# Patient Record
Sex: Female | Born: 1965 | Race: White | Hispanic: No | Smoking: Former smoker
Health system: Southern US, Community
[De-identification: ages and names within clinical notes are randomized; demographics above are authoritative.]

## PROBLEM LIST (undated history)

## (undated) DIAGNOSIS — R42 Dizziness and giddiness: Secondary | ICD-10-CM

## (undated) DIAGNOSIS — N926 Irregular menstruation, unspecified: Secondary | ICD-10-CM

## (undated) DIAGNOSIS — B009 Herpesviral infection, unspecified: Secondary | ICD-10-CM

## (undated) DIAGNOSIS — K219 Gastro-esophageal reflux disease without esophagitis: Secondary | ICD-10-CM

## (undated) DIAGNOSIS — N9489 Other specified conditions associated with female genital organs and menstrual cycle: Secondary | ICD-10-CM

## (undated) DIAGNOSIS — C439 Malignant melanoma of skin, unspecified: Secondary | ICD-10-CM

## (undated) DIAGNOSIS — F319 Bipolar disorder, unspecified: Secondary | ICD-10-CM

## (undated) DIAGNOSIS — F32A Depression, unspecified: Secondary | ICD-10-CM

## (undated) DIAGNOSIS — Z9889 Other specified postprocedural states: Secondary | ICD-10-CM

## (undated) DIAGNOSIS — F329 Major depressive disorder, single episode, unspecified: Secondary | ICD-10-CM

## (undated) HISTORY — PX: MELANOMA EXCISION: SHX5266

## (undated) HISTORY — PX: TONSILLECTOMY: SHX5217

## (undated) HISTORY — DX: Gastro-esophageal reflux disease without esophagitis: K21.9

## (undated) HISTORY — DX: Herpesviral infection, unspecified: B00.9

## (undated) HISTORY — DX: Irregular menstruation, unspecified: N92.6

## (undated) HISTORY — PX: COLONOSCOPY: SHX174

## (undated) HISTORY — DX: Malignant melanoma of skin, unspecified: C43.9

## (undated) HISTORY — PX: CERVICAL BIOPSY: SHX590

## (undated) HISTORY — PX: COLONOSCOPY: SHX5424

## (undated) HISTORY — PX: DILATION AND CURETTAGE OF UTERUS: SHX78

## (undated) HISTORY — DX: Major depressive disorder, single episode, unspecified: F32.9

## (undated) HISTORY — DX: Depression, unspecified: F32.A

## (undated) HISTORY — DX: Bipolar disorder, unspecified: F31.9

## (undated) HISTORY — DX: Other specified conditions associated with female genital organs and menstrual cycle: N94.89

---

## 1998-07-15 ENCOUNTER — Ambulatory Visit (HOSPITAL_COMMUNITY): Admission: RE | Admit: 1998-07-15 | Discharge: 1998-07-15 | Payer: Self-pay

## 2004-11-29 ENCOUNTER — Ambulatory Visit: Payer: Self-pay | Admitting: Unknown Physician Specialty

## 2006-01-30 ENCOUNTER — Ambulatory Visit: Payer: Self-pay | Admitting: Unknown Physician Specialty

## 2007-02-04 ENCOUNTER — Ambulatory Visit: Payer: Self-pay | Admitting: Unknown Physician Specialty

## 2008-07-16 ENCOUNTER — Ambulatory Visit: Payer: Self-pay | Admitting: Unknown Physician Specialty

## 2008-07-21 ENCOUNTER — Ambulatory Visit: Payer: Self-pay | Admitting: Gynecologic Oncology

## 2008-08-04 ENCOUNTER — Ambulatory Visit: Payer: Self-pay | Admitting: Gynecologic Oncology

## 2008-08-19 ENCOUNTER — Ambulatory Visit: Payer: Self-pay | Admitting: Gynecologic Oncology

## 2008-09-01 ENCOUNTER — Ambulatory Visit: Payer: Self-pay | Admitting: Gynecologic Oncology

## 2008-09-13 ENCOUNTER — Ambulatory Visit: Payer: Self-pay | Admitting: Gynecologic Oncology

## 2008-09-29 ENCOUNTER — Ambulatory Visit: Payer: Self-pay | Admitting: Gynecologic Oncology

## 2008-12-14 ENCOUNTER — Ambulatory Visit: Payer: Self-pay | Admitting: Gynecologic Oncology

## 2008-12-29 ENCOUNTER — Ambulatory Visit: Payer: Self-pay | Admitting: Gynecologic Oncology

## 2010-09-02 ENCOUNTER — Ambulatory Visit: Payer: Self-pay | Admitting: Unknown Physician Specialty

## 2012-04-26 ENCOUNTER — Ambulatory Visit: Payer: Self-pay | Admitting: Unknown Physician Specialty

## 2012-05-01 LAB — PATHOLOGY REPORT

## 2012-08-23 ENCOUNTER — Ambulatory Visit: Payer: Self-pay | Admitting: Unknown Physician Specialty

## 2013-04-08 ENCOUNTER — Ambulatory Visit: Payer: Self-pay | Admitting: Unknown Physician Specialty

## 2013-04-24 ENCOUNTER — Ambulatory Visit: Payer: Self-pay | Admitting: Unknown Physician Specialty

## 2013-11-03 ENCOUNTER — Ambulatory Visit: Payer: Self-pay | Admitting: Unknown Physician Specialty

## 2014-01-27 ENCOUNTER — Ambulatory Visit: Payer: Self-pay | Admitting: Obstetrics & Gynecology

## 2014-01-27 LAB — CBC
HCT: 41.9 % (ref 35.0–47.0)
HGB: 14 g/dL (ref 12.0–16.0)
MCH: 30.4 pg (ref 26.0–34.0)
MCHC: 33.3 g/dL (ref 32.0–36.0)
MCV: 91 fL (ref 80–100)
PLATELETS: 169 10*3/uL (ref 150–440)
RBC: 4.6 10*6/uL (ref 3.80–5.20)
RDW: 13.9 % (ref 11.5–14.5)
WBC: 7.5 10*3/uL (ref 3.6–11.0)

## 2014-01-27 LAB — PREGNANCY, URINE: PREGNANCY TEST, URINE: NEGATIVE m[IU]/mL

## 2014-02-05 ENCOUNTER — Ambulatory Visit: Payer: Self-pay | Admitting: Obstetrics & Gynecology

## 2014-02-09 LAB — PATHOLOGY REPORT

## 2014-10-22 ENCOUNTER — Emergency Department: Payer: Self-pay | Admitting: Emergency Medicine

## 2015-03-06 NOTE — Op Note (Signed)
PATIENT NAME:  Barbara [name], PROPPS MR#:  [id number] DATE OF BIRTH:  October 23, 1966  DATE OF PROCEDURE:  02/05/2014  PREOPERATIVE DIAGNOSES:   1.  Abnormal uterine bleeding.  2.  Endometrial thickening.  3.  Left lower quadrant pain.   POSTOPERATIVE DIAGNOSES: 1.  Abnormal uterine bleeding.  2.  Endometrial thickening.  3.  Left lower quadrant pain.   PROCEDURE: Laparoscopy with lysis of adhesions, hysteroscopy with D and C.  SURGEON:  R. Barnett Applebaum, MD  ANESTHESIA: General.   BLOOD LOSS: Minimal.   COMPLICATIONS: None.   FINDINGS: The patient had a thickened lining, but no polyps or fibroids seen with hysteroscopy. The patient had significant adhesions in the left lower quadrant around the left adnexa and colon. The patient had normal ovaries. The patient had 2 small 0.5 cm fibroids in the right fundal region of the uterus.   DISPOSITION: To the recovery room in stable condition.   TECHNIQUE: The patient is prepped and draped in the usual sterile fashion after adequate anesthesia is obtained in the dorsal lithotomy position. Bladder is drained with a Foley catheter.  A Hulka tenaculum is placed on the cervix for manipulation purposes, after the uterus is sounded to 8 cm and dilated to size 18 Pratt dilator.   Attention is then turned to abdomen, where a Veress needle is inserted through a 5 mm infraumbilical incision. Marcaine is used to anesthetize the skin. Veress needle placement is confirmed using the hanging drop technique, and the abdomen is then insufflated with CO2 gas. A 5 mm trocar is then inserted under direct visualization with the laparoscope, with no injuries or bleeding noted. The patient is placed in Trendelenburg position, and the above-mentioned findings visualized. A 5 mm trocar is placed in the right lower quadrant lateral to the inferior epigastric blood vessels, with no injuries or bleeding noted. Using the Harmonic scalpel, a lysis of adhesions is performed, with careful  dissection of the filmy as well as thickened adhesions of the colon to the left abdominal sidewall, as well as the left round ligament and fallopian tube to the pelvic sidewall. There are no ovarian masses. No cysts. No fluid collections. There is no evidence for infection or for congested veins or blood vessels. There is no sign of endometriosis. The patient does have 2 small 0.5 cm fibroids on the right fundal region of the uterus that are apparently incidental in finding. There is no reason for left adnexa removal or excision, as it is purely scar tissue in this area, and so no further surgical procedures are performed. The patient is leveled, gas is expelled, trocars are removed, and skin is closed with Dermabond.   Hysteroscopy D and C is performed. The cervix is dilated to a size 20 Pratt dilator after the Hulka tenaculum is removed. A 30-degree hysteroscope with lactated Ringer's solution distention of the intrauterine cavity is performed, with no polyps or fibroids seen. Hysteroscope is removed with a minimal discrepancy of fluid, and a D and C curettage procedure is performed, with specimen sent to Pathology for further review. The patient shows no bleeding from the cervix or from the tenaculum site, and goes to the recovery room in stable condition. All sponge, instrument and needle counts are correct.   ____________________________ R. Barnett Applebaum, MD rph:mr D: 02/05/2014 16:48:44 ET T: 02/05/2014 19:41:11 ET JOB#: [id number]  cc: Glean Salen, MD, <Dictator> Gae Dry MD ELECTRONICALLY SIGNED 02/06/2014 10:12

## 2015-09-27 ENCOUNTER — Other Ambulatory Visit: Payer: Self-pay | Admitting: Family Medicine

## 2015-09-27 ENCOUNTER — Ambulatory Visit (INDEPENDENT_AMBULATORY_CARE_PROVIDER_SITE_OTHER): Payer: 59 | Admitting: Family Medicine

## 2015-09-27 ENCOUNTER — Encounter: Payer: Self-pay | Admitting: Family Medicine

## 2015-09-27 VITALS — BP 102/64 | HR 78 | Temp 98.5°F | Resp 12 | Ht 68.5 in | Wt 206.0 lb

## 2015-09-27 DIAGNOSIS — Z13220 Encounter for screening for lipoid disorders: Secondary | ICD-10-CM | POA: Diagnosis not present

## 2015-09-27 DIAGNOSIS — G4700 Insomnia, unspecified: Secondary | ICD-10-CM

## 2015-09-27 DIAGNOSIS — F3175 Bipolar disorder, in partial remission, most recent episode depressed: Secondary | ICD-10-CM | POA: Insufficient documentation

## 2015-09-27 DIAGNOSIS — F3176 Bipolar disorder, in full remission, most recent episode depressed: Secondary | ICD-10-CM

## 2015-09-27 DIAGNOSIS — F5105 Insomnia due to other mental disorder: Secondary | ICD-10-CM | POA: Insufficient documentation

## 2015-09-27 DIAGNOSIS — R5383 Other fatigue: Secondary | ICD-10-CM | POA: Insufficient documentation

## 2015-09-27 DIAGNOSIS — Z136 Encounter for screening for cardiovascular disorders: Secondary | ICD-10-CM

## 2015-09-27 DIAGNOSIS — IMO0001 Reserved for inherently not codable concepts without codable children: Secondary | ICD-10-CM

## 2015-09-27 DIAGNOSIS — F99 Mental disorder, not otherwise specified: Secondary | ICD-10-CM | POA: Insufficient documentation

## 2015-09-27 NOTE — Progress Notes (Signed)
Name: Barbara [name]   MRN: [id number]    DOB: May 12, 1966   Date:09/27/2015       Progress Note  Subjective  Chief Complaint  Chief Complaint  Patient presents with  . Establish Care  . Medication Management    patient used to see a psychiatrist in [place], but they stopped taking her insurance.    HPI  Barbara [name] is a 49 y.o. female here today to transition care of medical needs to a primary care provider. Reports long history of depression and Bipolar disorder which is currently stable on current medications. She was going to a psychiatrist but they no longer take her insurance. Denies any changes to mood or affect. Has sufficient refills until June/July 2017. Works for [employer] and has had biometric screening, is willing to redo some blood work. Reports occasional fatigue, just had mammogram and female CPE with gynecologist, is peri-menopausal.     Past Medical History  Diagnosis Date  . Depression   . Melanoma [facility])     Patient Active Problem List   Diagnosis Date Noted  . Insomnia 09/27/2015    Social History  Substance Use Topics  . Smoking status: Former Research scientist (life sciences)  . Smokeless tobacco: Not on file  . Alcohol Use: No     Current outpatient prescriptions:  .  divalproex (DEPAKOTE ER) 500 MG 24 hr tablet, Take 1 tablet by mouth daily., Disp: , Rfl:  .  ELIDEL 1 % cream, Apply 1 application topically 2 (two) times daily as needed., Disp: , Rfl:  .  LATUDA 40 MG TABS tablet, Take 1 tablet by mouth daily., Disp: , Rfl:  .  traZODone (DESYREL) 50 MG tablet, Take 1-3 tablets by mouth at bedtime as needed., Disp: , Rfl: 11  Past Surgical History  Procedure Laterality Date  . Tonsillectomy    . Colonoscopy    . Cervical biopsy      Dr. Kenton Kingfisher with [facility]    Family History  Problem Relation Age of Onset  . Heart attack Father   . Cancer Maternal Aunt     breast  . Cancer Paternal Aunt     breast    Allergies  Allergen Reactions  . Penicillins  Hives and Other (See Comments)    delusional  . Sulfa Antibiotics Hives and Other (See Comments)    dellusional     Review of Systems  CONSTITUTIONAL: No significant weight changes, fever, chills, weakness or fatigue.  HEENT:  - Eyes: No visual changes.  - Ears: No auditory changes. No pain.  - Nose: No sneezing, congestion, runny nose. - Throat: No sore throat. No changes in swallowing. SKIN: No rash or itching.  CARDIOVASCULAR: No chest pain, chest pressure or chest discomfort. No palpitations or edema.  RESPIRATORY: No shortness of breath, cough or sputum.  GASTROINTESTINAL: No anorexia, nausea, vomiting. No changes in bowel habits. No abdominal pain or blood.  NEUROLOGICAL: No headache, dizziness, syncope, paralysis, ataxia, numbness or tingling in the extremities. No memory changes. No change in bowel or bladder control.  MUSCULOSKELETAL: No joint pain. No muscle pain. HEMATOLOGIC: No anemia, bleeding or bruising.  LYMPHATICS: No enlarged lymph nodes.  PSYCHIATRIC: No change in mood. No change in sleep pattern.  ENDOCRINOLOGIC: No reports of sweating, cold or heat intolerance. No polyuria or polydipsia.     Objective  BP 102/64 mmHg  Pulse 78  Temp(Src) 98.5 F (36.9 C) (Oral)  Resp 12  Ht 5' 8.5" (1.74 m)  Wt 206  lb (93.441 kg)  BMI 30.86 kg/m2  SpO2 97% Body mass index is 30.86 kg/(m^2).  Physical Exam  Constitutional: Patient appears well-developed and well-nourished. In no distress.  HEENT:  - Head: Normocephalic and atraumatic.  - Ears: Bilateral TMs gray, no erythema or effusion - Nose: Nasal mucosa moist - Mouth/Throat: Oropharynx is clear and moist. No tonsillar hypertrophy or erythema. No post nasal drainage.  - Eyes: Conjunctivae clear, EOM movements normal. PERRLA. No scleral icterus.  Neck: Normal range of motion. Neck supple. No JVD present. No thyromegaly present.  Cardiovascular: Normal rate, regular rhythm and normal heart sounds.  No murmur  heard.  Pulmonary/Chest: Effort normal and breath sounds normal. No respiratory distress. Musculoskeletal: Normal range of motion bilateral UE and LE, no joint effusions. Peripheral vascular: Bilateral LE no edema. Neurological: CN II-XII grossly intact with no focal deficits. Alert and oriented to person, place, and time. Coordination, balance, strength, speech and gait are normal.  Skin: Skin is warm and dry. No rash noted. No erythema.  Psychiatric: Patient has a stable mood and affect. Behavior is normal in office today. Judgment and thought content normal in office today.  Assessment & Plan  1. Bipolar 1 disorder, depressed, full remission ([place]) I will continue to refill her medications unless there are suboptimal mood changes at which time I will refer her to a new psychiatrist.  - CBC with Differential/Platelet - Comprehensive metabolic panel - TSH  2. Encounter for cholesteral screening for cardiovascular disease  - Lipid panel  3. Other fatigue  - CBC with Differential/Platelet - Comprehensive metabolic panel - TSH

## 2015-10-04 ENCOUNTER — Encounter: Payer: Self-pay | Admitting: Family Medicine

## 2015-10-14 LAB — CBC WITH DIFFERENTIAL/PLATELET
Basophils Absolute: 0 10*3/uL (ref 0.0–0.2)
Basos: 1 %
EOS (ABSOLUTE): 0.2 10*3/uL (ref 0.0–0.4)
EOS: 4 %
HEMATOCRIT: 40.6 % (ref 34.0–46.6)
HEMOGLOBIN: 13.5 g/dL (ref 11.1–15.9)
Immature Grans (Abs): 0 10*3/uL (ref 0.0–0.1)
Immature Granulocytes: 0 %
LYMPHS ABS: 2.6 10*3/uL (ref 0.7–3.1)
Lymphs: 44 %
MCH: 30 pg (ref 26.6–33.0)
MCHC: 33.3 g/dL (ref 31.5–35.7)
MCV: 90 fL (ref 79–97)
MONOCYTES: 10 %
Monocytes Absolute: 0.6 10*3/uL (ref 0.1–0.9)
NEUTROS ABS: 2.4 10*3/uL (ref 1.4–7.0)
Neutrophils: 41 %
Platelets: 189 10*3/uL (ref 150–379)
RBC: 4.5 x10E6/uL (ref 3.77–5.28)
RDW: 14.5 % (ref 12.3–15.4)
WBC: 5.9 10*3/uL (ref 3.4–10.8)

## 2015-10-15 LAB — COMPREHENSIVE METABOLIC PANEL
A/G RATIO: 1.6 (ref 1.1–2.5)
ALT: 10 IU/L (ref 0–32)
AST: 16 IU/L (ref 0–40)
Albumin: 4.2 g/dL (ref 3.5–5.5)
Alkaline Phosphatase: 40 IU/L (ref 39–117)
BUN/Creatinine Ratio: 32 — ABNORMAL HIGH (ref 9–23)
BUN: 26 mg/dL — ABNORMAL HIGH (ref 6–24)
Bilirubin Total: 0.2 mg/dL (ref 0.0–1.2)
CALCIUM: 9.4 mg/dL (ref 8.7–10.2)
CO2: 26 mmol/L (ref 18–29)
Chloride: 101 mmol/L (ref 97–106)
Creatinine, Ser: 0.81 mg/dL (ref 0.57–1.00)
GFR, EST AFRICAN AMERICAN: 97 mL/min/{1.73_m2} (ref >59)
GFR, EST NON AFRICAN AMERICAN: 84 mL/min/{1.73_m2} (ref >59)
GLOBULIN, TOTAL: 2.7 g/dL (ref 1.5–4.5)
Glucose: 77 mg/dL (ref 65–99)
Potassium: 4.5 mmol/L (ref 3.5–5.2)
SODIUM: 139 mmol/L (ref 136–144)
TOTAL PROTEIN: 6.9 g/dL (ref 6.0–8.5)

## 2015-10-15 LAB — TSH: TSH: 2.77 u[IU]/mL (ref 0.450–4.500)

## 2015-10-15 LAB — LIPID PANEL
CHOL/HDL RATIO: 4.3 ratio (ref 0.0–4.4)
Cholesterol, Total: 210 mg/dL — ABNORMAL HIGH (ref 100–199)
HDL: 49 mg/dL (ref >39)
LDL Calculated: 132 mg/dL — ABNORMAL HIGH (ref 0–99)
Triglycerides: 144 mg/dL (ref 0–149)
VLDL Cholesterol Cal: 29 mg/dL (ref 5–40)

## 2016-02-04 ENCOUNTER — Emergency Department
Admission: EM | Admit: 2016-02-04 | Discharge: 2016-02-04 | Disposition: A | Discharge status: Home / Self Care | Payer: 59 | Attending: Emergency Medicine | Admitting: Emergency Medicine

## 2016-02-04 ENCOUNTER — Emergency Department: Payer: 59

## 2016-02-04 DIAGNOSIS — C439 Malignant melanoma of skin, unspecified: Secondary | ICD-10-CM | POA: Insufficient documentation

## 2016-02-04 DIAGNOSIS — Z87891 Personal history of nicotine dependence: Secondary | ICD-10-CM | POA: Insufficient documentation

## 2016-02-04 DIAGNOSIS — R0789 Other chest pain: Secondary | ICD-10-CM | POA: Diagnosis present

## 2016-02-04 DIAGNOSIS — Z79899 Other long term (current) drug therapy: Secondary | ICD-10-CM | POA: Insufficient documentation

## 2016-02-04 DIAGNOSIS — F3176 Bipolar disorder, in full remission, most recent episode depressed: Secondary | ICD-10-CM | POA: Insufficient documentation

## 2016-02-04 DIAGNOSIS — Z7982 Long term (current) use of aspirin: Secondary | ICD-10-CM | POA: Insufficient documentation

## 2016-02-04 DIAGNOSIS — R079 Chest pain, unspecified: Secondary | ICD-10-CM

## 2016-02-04 LAB — BASIC METABOLIC PANEL
Anion gap: 7 (ref 5–15)
BUN: 21 mg/dL — AB (ref 6–20)
CHLORIDE: 108 mmol/L (ref 101–111)
CO2: 23 mmol/L (ref 22–32)
Calcium: 8.8 mg/dL — ABNORMAL LOW (ref 8.9–10.3)
Creatinine, Ser: 1 mg/dL (ref 0.44–1.00)
GFR calc Af Amer: >60 mL/min (ref >60)
GFR calc non Af Amer: >60 mL/min (ref >60)
Glucose, Bld: 92 mg/dL (ref 65–99)
POTASSIUM: 4 mmol/L (ref 3.5–5.1)
SODIUM: 138 mmol/L (ref 135–145)

## 2016-02-04 LAB — CBC
HEMATOCRIT: 40.8 % (ref 35.0–47.0)
Hemoglobin: 13.8 g/dL (ref 12.0–16.0)
MCH: 30.5 pg (ref 26.0–34.0)
MCHC: 33.9 g/dL (ref 32.0–36.0)
MCV: 89.8 fL (ref 80.0–100.0)
PLATELETS: 168 10*3/uL (ref 150–440)
RBC: 4.54 MIL/uL (ref 3.80–5.20)
RDW: 13.9 % (ref 11.5–14.5)
WBC: 7.3 10*3/uL (ref 3.6–11.0)

## 2016-02-04 LAB — TROPONIN I

## 2016-02-04 MED ORDER — LORAZEPAM 2 MG/ML IJ SOLN
1.0000 mg | Freq: Once | INTRAMUSCULAR | Status: AC
  Administered 2016-02-04: 1 mg via INTRAVENOUS
  Filled 2016-02-04: qty 1

## 2016-02-04 MED ORDER — ONDANSETRON HCL 4 MG/2ML IJ SOLN
4.0000 mg | Freq: Once | INTRAMUSCULAR | Status: AC
  Administered 2016-02-04: 4 mg via INTRAVENOUS
  Filled 2016-02-04: qty 2

## 2016-02-04 NOTE — ED Notes (Signed)
Lab contacted this RN to state that blood specimens are hemolyzed.

## 2016-02-04 NOTE — ED Notes (Signed)
Patient transported to X-ray 

## 2016-02-04 NOTE — ED Notes (Signed)
Pt up to the bathroom

## 2016-02-04 NOTE — Discharge Instructions (Signed)
You have been seen in the emergency department today for chest pain. Your workup has shown normal results. As we discussed please follow-up with your primary care physician in the next 1-2 days for recheck. Return to the emergency department for any further chest pain, trouble breathing, or any other symptom personally concerning to yourself. °Please call the number provided for cardiology to arrange a stress test as soon as possible. ° ° °Nonspecific Chest Pain °It is often hard to find the cause of chest pain. There is always a chance that your pain could be related to something serious, such as a heart attack or a blood clot in your lungs. Chest pain can also be caused by conditions that are not life-threatening. If you have chest pain, it is very important to follow up with your doctor. ° °HOME CARE °· If you were prescribed an antibiotic medicine, finish it all even if you start to feel better. °· Avoid any activities that cause chest pain. °· Do not use any tobacco products, including cigarettes, chewing tobacco, or electronic cigarettes. If you need help quitting, ask your doctor. °· Do not drink alcohol. °· Take medicines only as told by your doctor. °· Keep all follow-up visits as told by your doctor. This is important. This includes any further testing if your chest pain does not go away. °· Your doctor may tell you to keep your head raised (elevated) while you sleep. °· Make lifestyle changes as told by your doctor. These may include: °¨ Getting regular exercise. Ask your doctor to suggest some activities that are safe for you. °¨ Eating a heart-healthy diet. Your doctor or a diet specialist (dietitian) can help you to learn healthy eating options. °¨ Maintaining a healthy weight. °¨ Managing diabetes, if necessary. °¨ Reducing stress. °GET HELP IF: °· Your chest pain does not go away, even after treatment. °· You have a rash with blisters on your chest. °· You have a fever. °GET HELP RIGHT AWAY  IF: °· Your chest pain is worse. °· You have an increasing cough, or you cough up blood. °· You have severe belly (abdominal) pain. °· You feel extremely weak. °· You pass out (faint). °· You have chills. °· You have sudden, unexplained chest discomfort. °· You have sudden, unexplained discomfort in your arms, back, neck, or jaw. °· You have shortness of breath at any time. °· You suddenly start to sweat, or your skin gets clammy. °· You feel nauseous. °· You vomit. °· You suddenly feel light-headed or dizzy. °· Your heart begins to beat quickly, or it feels like it is skipping beats. °These symptoms may be an emergency. Do not wait to see if the symptoms will go away. Get medical help right away. Call your local emergency services (911 in the [place] Do not drive yourself to the hospital. °  °This information is not intended to replace advice given to you by your health care provider. Make sure you discuss any questions you have with your health care provider. °  °Document Released: 04/17/2008 Document Revised: 11/20/2014 Document Reviewed: 06/05/2014 °Elsevier Interactive Patient Education ©2016 Elsevier Inc. ° °

## 2016-02-04 NOTE — ED Notes (Signed)
Pt awoke with left sided chest discomfort this AM. Pt is very anxious, tearful and states that she is scared. HX of panic attacks and GERD. Pt reports excess flatus this AM. Pt alert and oriented X4, active, cooperative, pt in NAD. RR even and unlabored, color WNL.

## 2016-02-04 NOTE — ED Provider Notes (Signed)
[facility] Emergency Department Provider Note  Time seen: 7:35 AM  I have reviewed the triage vital signs and the nursing notes.   HISTORY  Chief Complaint Chest Pain    HPI Barbara [name] is a 50 y.o. female with a past medical history of depression presents the emergency department with chest pain. According to the patient since 3:00 this morning she has been experiencing left-sided chest discomfort. Patient states she has had chest pain previously with her anxiety/gastric reflux, but states today felt different. States she could not sleep due to the chest discomfort. Denies any shortness of breath or diaphoresis, does admit nausea. Denies vomiting. Patient describes the pain as an aching pain in her left chest. Denies any pleuritic component. Patient states she is just getting over an upper respiratory infection for the past 1.5 weeks. Occasional cough but denies sputum production. Describes her chest discomfort is mild to moderate.     Past Medical History  Diagnosis Date  . Depression   . Melanoma [facility]     Patient Active Problem List   Diagnosis Date Noted  . Insomnia 09/27/2015  . Bipolar 1 disorder, depressed, full remission ([place]) 09/27/2015  . Encounter for cholesteral screening for cardiovascular disease 09/27/2015  . Other fatigue 09/27/2015    Past Surgical History  Procedure Laterality Date  . Tonsillectomy    . Colonoscopy    . Cervical biopsy      Dr. Kenton Kingfisher with [facility]    Current Outpatient Rx  Name  Route  Sig  Dispense  Refill  . divalproex (DEPAKOTE ER) 500 MG 24 hr tablet   Oral   Take 1 tablet by mouth daily.         Marland Kitchen ELIDEL 1 % cream   Topical   Apply 1 application topically 2 (two) times daily as needed.           Dispense as written.   Marland Kitchen LATUDA 40 MG TABS tablet   Oral   Take 1 tablet by mouth daily.           Dispense as written.   . traZODone (DESYREL) 50 MG tablet   Oral   Take 1-3  tablets by mouth at bedtime as needed.      11     Allergies Penicillins and Sulfa antibiotics  Family History  Problem Relation Age of Onset  . Heart attack Father   . Cancer Maternal Aunt     breast  . Cancer Paternal Aunt     breast    Social History Social History  Substance Use Topics  . Smoking status: Former Research scientist (life sciences)  . Smokeless tobacco: Not on file  . Alcohol Use: No    Review of Systems Constitutional: Negative for fever. Cardiovascular: Left chest pain Respiratory: Negative for shortness of breath. Occasional cough Gastrointestinal: Negative for abdominal pain Musculoskeletal: Negative for back pain. Neurological: Negative for headache 10-point ROS otherwise negative.  ____________________________________________   PHYSICAL EXAM:  VITAL SIGNS: ED Triage Vitals  Enc Vitals Group     BP 02/04/16 0618 148/78 mmHg     Pulse Rate 02/04/16 0618 71     Resp 02/04/16 0618 18     Temp 02/04/16 0618 98 F (36.7 C)     Temp src --      SpO2 02/04/16 0618 100 %     Weight 02/04/16 0618 200 lb (90.719 kg)     Height 02/04/16 0618 5\' 9"  (1.753 m)  Head Cir --      Peak Flow --      Pain Score 02/04/16 0618 5     Pain Loc --      Pain Edu? --      Excl. in [place]? --     Constitutional: Alert and oriented. Overall well-appearing, but tremulous, consistent with moderate anxiety. Eyes: Normal exam ENT   Head: Normocephalic and atraumatic   Mouth/Throat: Mucous membranes are moist. Cardiovascular: Normal rate, regular rhythm. No murmur Respiratory: Normal respiratory effort without tachypnea nor retractions. Breath sounds are clear. Mild left chest tenderness to palpation. Gastrointestinal: Soft and nontender. No distention.   Musculoskeletal: Nontender with normal range of motion in all extremities. No lower extremity tenderness or edema. Neurologic:  Normal speech and language. No gross focal neurologic deficits Skin:  Skin is warm, dry and intact.   Psychiatric: Mood and affect are normal. Speech and behavior are normal. ____________________________________________    EKG  EKG reviewed and interpreted by myself shows normal sinus rhythm at 73 bpm, narrow QRS, normal axis, normal intervals, nonspecific ST changes. No ST elevations. ____________________________________________    RADIOLOGY  Chest x-ray shows no acute abnormality     INITIAL IMPRESSION / ASSESSMENT AND PLAN / ED COURSE  Pertinent labs & imaging results that were available during my care of the patient were reviewed by me and considered in my medical decision making (see chart for details).  Patient presents the emergency department with left-sided chest discomfort. Currently the patient appears overall well but is very anxious, tremulous voice, admits feeling very anxious. We will check labs, chest x-ray, EKG does not show any acute findings. We will chew with Ativan while awaiting lab and chest x-ray results. Patient agreeable.  Chest x-ray negative. Labs within normal limits. Second troponin negative. We will discharge home with cardiology follow-up for a stress test. Patient agreeable to plan. Discussed when normal chest pain return precautions.  ____________________________________________   FINAL CLINICAL IMPRESSION(S) / ED DIAGNOSES  Chest pain   Harvest Dark, MD 02/04/16 1134

## 2016-02-04 NOTE — ED Notes (Signed)
Pt in with co midsternal chest pain that started at 0300 radiates to left chest no hx of heart disease.

## 2016-02-09 ENCOUNTER — Encounter: Payer: Self-pay | Admitting: Cardiovascular Disease

## 2016-02-09 ENCOUNTER — Ambulatory Visit (INDEPENDENT_AMBULATORY_CARE_PROVIDER_SITE_OTHER): Payer: 59 | Admitting: Cardiovascular Disease

## 2016-02-09 VITALS — BP 110/78 | HR 78 | Ht 69.0 in | Wt 201.5 lb

## 2016-02-09 DIAGNOSIS — E669 Obesity, unspecified: Secondary | ICD-10-CM | POA: Insufficient documentation

## 2016-02-09 DIAGNOSIS — E785 Hyperlipidemia, unspecified: Secondary | ICD-10-CM

## 2016-02-09 DIAGNOSIS — R0789 Other chest pain: Secondary | ICD-10-CM | POA: Diagnosis not present

## 2016-02-09 DIAGNOSIS — R079 Chest pain, unspecified: Secondary | ICD-10-CM | POA: Diagnosis not present

## 2016-02-09 NOTE — Patient Instructions (Addendum)
You are doing well. No medication changes were made.  We will place an order for a treadmill stress echo for chest pain, abnormal EKG   Please call us if you have new issues that need to be addressed before your next appt.      Stress Echo: April 24th at 10:30 AM   How to prepare for your Stress Echo test:  1.   No caffeine for 24 hours prior to test 2.   No smoking 24 hours prior to test. 3.   Ladies, please do not wear dresses.  Skirts or pants are appropriate. Please wear a short sleeve shirt. 4.  No perfume, cologne or lotion. 5.  Wear comfortable walking shoes. No heels!          Exercise Stress Echocardiogram An exercise stress echocardiogram is a heart (cardiac) test used to check the function of your heart. This test may also be called an exercise stress echocardiography or stress echo. This stress test will check how well your heart muscle and valves are working and determine if your heart muscle is getting enough blood. You will exercise on a treadmill to naturally increase or stress the functioning of your heart.  An echocardiogram uses sound waves (ultrasound) to produce an image of your heart. If your heart does not work normally, it may indicate coronary artery disease with poor coronary blood supply. The coronary arteries are the arteries that bring blood and oxygen to your heart. LET [facility] CARE PROVIDER KNOW ABOUT:  Any allergies you have.  All medicines you are taking, including vitamins, herbs, eye drops, creams, and over-the-counter medicines.  Previous problems you or members of your family have had with the use of anesthetics.  Any blood disorders you have.  Previous surgeries you have had.  Medical conditions you have.  Possibility of pregnancy, if this applies. RISKS AND COMPLICATIONS Generally, this is a safe procedure. However, as with any procedure, complications can occur. Possible complications can include:  You develop pain or pressure  in the following areas:  Chest.  Jaw or neck.  Between your shoulder blades.  Radiating down your left arm.  Dizziness or lightheadedness.  Shortness of breath.  Increased or irregular heartbeat.  Nausea or vomiting.  Heart attack (rare). BEFORE THE PROCEDURE  Avoid all forms of caffeine for 24 hours before your test or as directed by your health care provider. This includes coffee, tea (even decaffeinated tea), caffeinated sodas, chocolate, cocoa, and certain pain medicines.  Follow your health care provider's instructions regarding eating and drinking before the test.  Take your medicines as directed at regular times with water unless instructed otherwise. Exceptions may include:  If you have diabetes, ask how you are to take your insulin or pills. It is common to adjust insulin dosing the morning of the test.  If you are taking beta-blocker medicines, it is important to talk to your health care provider about these medicines well before the date of your test. Taking beta-blocker medicines may interfere with the test. In some cases, these medicines need to be changed or stopped 24 hours or more before the test.  If you wear a nitroglycerin patch, it may need to be removed prior to the test. Ask your health care provider if the patch should be removed before the test.  If you use an inhaler for any breathing condition, bring it with you to the test.  If you are an outpatient, bring a snack so you can eat right  after the stress phase of the test.  Do not smoke for 4 hours prior to the test or as directed by your health care provider.  Wear loose-fitting clothes and comfortable shoes for the test. This test involves walking on a treadmill. PROCEDURE   Multiple electrodes will be put on your chest. If needed, small areas of your chest may be shaved to get better contact with the electrodes. Once the electrodes are attached to your body, multiple wires will be attached to the  electrodes, and your heart rate will be monitored.  You will have an echocardiogram done at rest.  To produce this image of your heart, gel is applied to your chest, and a wand-like tool (transducer) is moved over the chest. The transducer sends the sound waves through the chest to create the moving images of your heart.  You may need an IV to receive a medication that improves the quality of the pictures.  You will then walk on a treadmill. The treadmill will be started at a slow pace. The treadmill speed and incline will gradually be increased to raise your heart rate.  At the peak of exercise, the treadmill will be stopped. You will lie down immediately on a bed so that a second echocardiogram can be done to visualize your heart's motion with exercise.  The test usually takes 30-60 minutes to complete. AFTER THE PROCEDURE  Your heart rate and blood pressure will be monitored after the test.  You may return to your normal schedule, including diet, activities, and medicines, unless your health care provider tells you otherwise.   This information is not intended to replace advice given to you by your health care provider. Make sure you discuss any questions you have with your health care provider.   Document Released: 11/03/2004 Document Revised: 11/04/2013 Document Reviewed: 07/07/2013 Elsevier Interactive Patient Education [employer].

## 2016-02-09 NOTE — Assessment & Plan Note (Signed)
Recommended weight loss, exercise in an effort to improve her cholesterol numbers

## 2016-02-09 NOTE — Progress Notes (Signed)
Patient ID: LORRINE WILLOCKS, female    DOB: 30-Oct-1966, 50 y.o.   MRN: [id number]  HPI Comments: Ms. Kobes is a pleasant 50 year old woman with history of depression, anxiety, obesity, hyperlipidemia who presents for evaluation of chest pain symptoms. Patient was referred for consultation by ER physician, Dr. Kerman Passey after recent emergency room visit Prior history of chest pain in her mediastinum  She reports that last week she woke at 3 AM with severe left-sided chest pain. Symptoms persisted into the morning, she waited until later in the morning to contact family and they took her to the emergency room. She described the pain as the knee the left breast, some discomfort on palpation, heavy pressure This left side chest pain was different from prior central mediastinal chest pressures she had in the past  Emergency room records reviewed with her, Cardiac enzymes negative, EKG showing nonspecific ST and T wave abnormality. Other lab work normal, symptoms eventually resolved and she was discharged home with follow-up today  She does report no smoking, no diabetes Lab work reviewed showing total cholesterol 210, LDL 132 Weight has been trending upwards, she is trying to change her diet to lose weight  She does report strong family history of coronary artery disease, father in his 51s but he was a heavy smoker, he had an MI  EKG on today's visit shows normal sinus rhythm with rate 78 bpm, nonspecific ST abnormality in V4 through V6 EKG from the emergency room reviewed from March 24 also showing nonspecific ST abnormality anterolateral leads      Allergies  Allergen Reactions  . Penicillins Hives and Other (See Comments)    delusional  . Sulfa Antibiotics Hives and Other (See Comments)    dellusional    Current Outpatient Prescriptions on File Prior to Visit  Medication Sig Dispense Refill  . Aspirin-Acetaminophen-Caffeine (EXCEDRIN MIGRAINE PO) Take 1-2 tablets by mouth daily  as needed (for migraine).    . divalproex (DEPAKOTE ER) 500 MG 24 hr tablet Take 2 tablets by mouth daily.     Marland Kitchen ELIDEL 1 % cream Apply 1 application topically 2 (two) times daily as needed.    Marland Kitchen LATUDA 40 MG TABS tablet Take 1 tablet by mouth daily.    . traZODone (DESYREL) 50 MG tablet Take 1-3 tablets by mouth at bedtime as needed for sleep.   11   No current facility-administered medications on file prior to visit.    Past Medical History  Diagnosis Date  . Depression   . Melanoma [facility])     Past Surgical History  Procedure Laterality Date  . Tonsillectomy    . Colonoscopy    . Cervical biopsy      Dr. Kenton Kingfisher with [facility]  reports that she has quit smoking. She does not have any smokeless tobacco history on file. She reports that she does not drink alcohol or use illicit drugs.  Family History family history includes Cancer in her maternal aunt and paternal aunt; Heart attack in her father.      Review of Systems  Constitutional: Negative.   HENT: Negative.   Eyes: Negative.   Respiratory: Positive for chest tightness.   Cardiovascular: Positive for chest pain.  Gastrointestinal: Negative.   Endocrine: Negative.   Musculoskeletal: Negative.   Skin: Negative.   Allergic/Immunologic: Negative.   Neurological: Negative.   Hematological: Negative.   Psychiatric/Behavioral: Negative.   All other systems reviewed and are negative.   BP 110/78  mmHg  Pulse 78  Ht 5\' 9"  (1.753 m)  Wt 201 lb 8 oz (91.4 kg)  BMI 29.74 kg/m2  Physical Exam  Constitutional: She is oriented to person, place, and time. She appears well-developed and well-nourished.  Obese  HENT:  Head: Normocephalic.  Nose: Nose normal.  Mouth/Throat: Oropharynx is clear and moist.  Eyes: Conjunctivae are normal. Pupils are equal, round, and reactive to light.  Neck: Normal range of motion. Neck supple. No JVD present.  Cardiovascular: Normal rate, regular rhythm, normal  heart sounds and intact distal pulses.  Exam reveals no gallop and no friction rub.   No murmur heard. Pulmonary/Chest: Effort normal and breath sounds normal. No respiratory distress. She has no wheezes. She has no rales. She exhibits no tenderness.  Abdominal: Soft. Bowel sounds are normal. She exhibits no distension. There is no tenderness.  Musculoskeletal: Normal range of motion. She exhibits no edema or tenderness.  Lymphadenopathy:    She has no cervical adenopathy.  Neurological: She is alert and oriented to person, place, and time. Coordination normal.  Skin: Skin is warm and dry. No rash noted. No erythema.  Psychiatric: She has a normal mood and affect. Her behavior is normal. Judgment and thought content normal.

## 2016-02-09 NOTE — Assessment & Plan Note (Addendum)
Etiology of her left-sided chest pain is unclear Hospital records reviewed.  Nonsmoker, no diabetes, but she does have high cholesterol, family history. Long discussion concerning theVarious treatment options discussed with her. She would like a cardiac workup given her family history. We will schedule her for stress echocardiogram to rule out ischemia.  If study shows no wall motion abnormality, no ischemia, she may benefit from GI workup if symptoms recur

## 2016-02-09 NOTE — Assessment & Plan Note (Signed)
Long discussion concerning her diet, need for daily exercise. Diet provided to her today, long discussion concerning various dietary changes that she can make.   Total encounter time more than 60 minutes  Greater than 50% was spent in counseling and coordination of care with the patient

## 2016-03-06 ENCOUNTER — Encounter: Payer: Self-pay | Admitting: Family Medicine

## 2016-03-06 ENCOUNTER — Ambulatory Visit (INDEPENDENT_AMBULATORY_CARE_PROVIDER_SITE_OTHER): Payer: 59

## 2016-03-06 ENCOUNTER — Ambulatory Visit (INDEPENDENT_AMBULATORY_CARE_PROVIDER_SITE_OTHER): Payer: 59 | Admitting: Family Medicine

## 2016-03-06 VITALS — BP 122/84 | HR 82 | Temp 98.8°F | Resp 14 | Wt 201.5 lb

## 2016-03-06 DIAGNOSIS — G4700 Insomnia, unspecified: Secondary | ICD-10-CM | POA: Diagnosis not present

## 2016-03-06 DIAGNOSIS — F3176 Bipolar disorder, in full remission, most recent episode depressed: Secondary | ICD-10-CM

## 2016-03-06 DIAGNOSIS — R079 Chest pain, unspecified: Secondary | ICD-10-CM | POA: Diagnosis not present

## 2016-03-06 DIAGNOSIS — Z23 Encounter for immunization: Secondary | ICD-10-CM | POA: Diagnosis not present

## 2016-03-06 DIAGNOSIS — E669 Obesity, unspecified: Secondary | ICD-10-CM | POA: Diagnosis not present

## 2016-03-06 LAB — ECHOCARDIOGRAM STRESS TEST
CHL CUP MPHR: 169 {beats}/min
CSEPEDS: 47 s
CSEPEW: 8.2 METS
CSEPHR: 88 %
CSEPPHR: 150 {beats}/min
Exercise duration (min): 6 min
Rest HR: 75 {beats}/min

## 2016-03-06 MED ORDER — TRAZODONE HCL 50 MG PO TABS: 50.0000 mg | ORAL_TABLET | Freq: Every evening | ORAL | Status: DC | PRN

## 2016-03-06 NOTE — Assessment & Plan Note (Signed)
Patient feels very stable on her current doses of medicines; will continue; she does not think she needs to see a psychiatrist right now

## 2016-03-06 NOTE — Assessment & Plan Note (Signed)
Limit caffeine after 2 pm, using trazodone for sleep, I'll be happy to refill this

## 2016-03-06 NOTE — Patient Instructions (Signed)
Check out the information at familydoctor.org entitled "What It Takes to Lose Weight" Try to lose between 1-2 pounds per week by taking in fewer calories and burning off more calories You can succeed by limiting portions, limiting foods dense in calories and fat, becoming more active, and drinking 8 glasses of water a day (64 ounces) Don't skip meals, especially breakfast, as skipping meals may alter your metabolism Do not use over-the-counter weight loss pills or gimmicks that claim rapid weight loss A healthy BMI (or body mass index) is between 18.5 and 24.9 You can calculate your ideal BMI at the [facility] website ClubMonetize.fr Please send me a copy of your biometric screening in the fall with your cholesterol numbers You received the vaccine to protect against tetanus and diphtheria and pertussis today; the tetanus and diphtheria portions will provide protection up to ten years, and the pertussis component will give you protection against whooping cough for life Just contact my office when you need refills

## 2016-03-06 NOTE — Progress Notes (Signed)
BP 122/84 mmHg  Pulse 82  Temp(Src) 98.8 F (37.1 C) (Oral)  Resp 14  Wt 201 lb 8 oz (91.4 kg)  SpO2 97%   Subjective:    Patient ID: Barbara [name], female    DOB: February 26, 1966, 50 y.o.   MRN: [id number]  HPI: Barbara [name] is a 50 y.o. female  Chief Complaint  Patient presents with  . Medication Refill    She is here to establish care with me; her psychiatrist in [place] dropped her insurance and she is in need of refills She feels good and stable as long as she can stay on her medicines Refill needs to go to [place], [phone], [facility], [place] She has been taking trazodone; the 50 mg trazodone, she was prescribed 1-3 a night; she really only takes 1 at night She is able to fall asleep and stay asleep better  She has derriere's disease; sees dermatologist, Dr. Evorn Gong Father has melanoma that went to his lymph node; that is stressing her, and a friend of hers has breast cancer  She is going to see cardiologist; having stress test; was in the ER in March; labs reviewed  We reviewed her last lipid panel; he gave her a diet and she is trying really hard  She is trying to lose weight; she had to get an appeal last year; going to have another BMI checked in August  Depression screen [facility] 2/9 03/06/2016 09/27/2015  Decreased Interest 0 0  Down, Depressed, Hopeless 0 0  PHQ - 2 Score 0 0   Relevant past medical, surgical, family and social history reviewed and updated as indicated. Past Medical History  Diagnosis Date  . Depression   . Melanoma ([facility])   mole smaller than a pinhead; [place]; years ago  Past Surgical History  Procedure Laterality Date  . Tonsillectomy    . Colonoscopy    . Cervical biopsy      Dr. Kenton Kingfisher with [facility]  did last surgery, but sees another gyn now; was only scar tissue  Family History  Problem Relation Age of Onset  . Heart attack Father   . Cancer Maternal Aunt     breast  . Cancer Paternal Aunt    breast  mammo through [facility]  Social History  Substance Use Topics  . Smoking status: Former Research scientist (life sciences)  . Smokeless tobacco: None  . Alcohol Use: No  quit smoking the day she found out she was pregnant; 50 years old  Interim medical history since our last visit reviewed. Allergies and medications reviewed and updated.  Review of Systems Per HPI unless specifically indicated above     Objective:    BP 122/84 mmHg  Pulse 82  Temp(Src) 98.8 F (37.1 C) (Oral)  Resp 14  Wt 201 lb 8 oz (91.4 kg)  SpO2 97%  Wt Readings from Last 3 Encounters:  03/06/16 201 lb 8 oz (91.4 kg)  02/09/16 201 lb 8 oz (91.4 kg)  02/04/16 200 lb (90.719 kg)    Physical Exam  Constitutional: She appears well-developed and well-nourished. No distress.  Cardiovascular: Normal rate.   Pulmonary/Chest: Effort normal and breath sounds normal.  Neurological: She is alert.  Skin: No pallor.  Psychiatric: She has a normal mood and affect. Her behavior is normal. Judgment and thought content normal.    Results for orders placed or performed during the hospital encounter of [id number]  Basic metabolic panel  Result Value Ref Range   Sodium 138  135 - 145 mmol/L   Potassium 4.0 3.5 - 5.1 mmol/L   Chloride 108 101 - 111 mmol/L   CO2 23 22 - 32 mmol/L   Glucose, Bld 92 65 - 99 mg/dL   BUN 21 (H) 6 - 20 mg/dL   Creatinine, Ser 1.00 0.44 - 1.00 mg/dL   Calcium 8.8 (L) 8.9 - 10.3 mg/dL   GFR calc non Af Amer >60 >60 mL/min   GFR calc Af Amer >60 >60 mL/min   Anion gap 7 5 - 15  CBC  Result Value Ref Range   WBC 7.3 3.6 - 11.0 K/uL   RBC 4.54 3.80 - 5.20 MIL/uL   Hemoglobin 13.8 12.0 - 16.0 g/dL   HCT 40.8 35.0 - 47.0 %   MCV 89.8 80.0 - 100.0 fL   MCH 30.5 26.0 - 34.0 pg   MCHC 33.9 32.0 - 36.0 g/dL   RDW 13.9 11.5 - 14.5 %   Platelets 168 150 - 440 K/uL  Troponin I  Result Value Ref Range   Troponin I <0.03 <0.031 ng/mL  Troponin I  Result Value Ref Range   Troponin I <0.03 <0.031 ng/mL       Assessment & Plan:   Problem List Items Addressed This Visit      Other   Insomnia    Limit caffeine after 2 pm, using trazodone for sleep, I'll be happy to refill this      Bipolar 1 disorder, depressed, full remission ([place]) - Primary    Patient feels very stable on her current doses of medicines; will continue; she does not think she needs to see a psychiatrist right now      Relevant Medications   traZODone (DESYREL) 50 MG tablet   Obesity    Try to lose weight slowly and gradually and safely, see AVS       Other Visit Diagnoses    Need for Tdap vaccination        Relevant Orders    Td : Tetanus/diphtheria >7yo Preservative  free (Completed)        Follow up plan: Return in about 6 months (around 09/05/2016) for mood, but call or come in sooner if needed.  An after-visit summary was printed and given to the patient at [place].  Please see the patient instructions which may contain other information and recommendations beyond what is mentioned above in the assessment and plan.  Meds ordered this encounter  Medications  . DISCONTD: traZODone (DESYREL) 50 MG tablet    Sig: Take 1 tablet (50 mg total) by mouth at bedtime as needed for sleep.    Dispense:  30 tablet    Refill:  11  . traZODone (DESYREL) 50 MG tablet    Sig: Take 1 tablet (50 mg total) by mouth at bedtime as needed for sleep.    Dispense:  90 tablet    Refill:  3    Orders Placed This Encounter  Procedures  . Td : Tetanus/diphtheria >7yo Preservative  free

## 2016-03-06 NOTE — Assessment & Plan Note (Signed)
Try to lose weight slowly and gradually and safely, see AVS

## 2016-03-07 ENCOUNTER — Other Ambulatory Visit: Payer: Self-pay

## 2016-03-07 DIAGNOSIS — R079 Chest pain, unspecified: Secondary | ICD-10-CM

## 2016-03-08 ENCOUNTER — Telehealth: Payer: Self-pay | Admitting: Cardiovascular Disease

## 2016-03-08 NOTE — Telephone Encounter (Signed)
Pt calling stating she would like some more info on Myoview she has scheduled 03/13/16

## 2016-03-08 NOTE — Telephone Encounter (Signed)
Patient called and just wanted more information on an exercise myoview test. Reviewed the test, procedure, and what the test looks for in detail with her. She verbalized understanding of all instructions and had no further questions at this time. Let her know to call back if any questions.

## 2016-03-13 ENCOUNTER — Ambulatory Visit
Admission: RE | Admit: 2016-03-13 | Discharge: 2016-03-13 | Disposition: A | Discharge status: Home / Self Care | Payer: 59 | Source: Ambulatory Visit | Attending: Cardiovascular Disease | Admitting: Cardiovascular Disease

## 2016-03-13 DIAGNOSIS — R079 Chest pain, unspecified: Secondary | ICD-10-CM

## 2016-03-13 LAB — NM MYOCAR MULTI W/SPECT W/WALL MOTION / EF
CHL CUP MPHR: 169 {beats}/min
CHL CUP NUCLEAR SDS: 0
CHL CUP RESTING HR STRESS: 60 {beats}/min
CHL CUP STRESS STAGE 1 HR: 64 {beats}/min
CHL CUP STRESS STAGE 1 SPEED: 0 mph
CHL CUP STRESS STAGE 2 HR: 64 {beats}/min
CHL CUP STRESS STAGE 2 SPEED: 0 mph
CHL CUP STRESS STAGE 3 GRADE: 10 %
CHL CUP STRESS STAGE 3 SPEED: 1.7 mph
CHL CUP STRESS STAGE 4 HR: 113 {beats}/min
CHL CUP STRESS STAGE 4 SBP: 159 mmHg
CHL CUP STRESS STAGE 4 SPEED: 2.5 mph
CHL CUP STRESS STAGE 5 GRADE: 14 %
CHL CUP STRESS STAGE 5 SPEED: 3.4 mph
CHL CUP STRESS STAGE 6 GRADE: 0 %
CHL CUP STRESS STAGE 6 HR: 105 {beats}/min
CHL CUP STRESS STAGE 7 HR: 72 {beats}/min
CHL CUP STRESS STAGE 7 SBP: 119 mmHg
CHL CUP STRESS STAGE 7 SPEED: 0 mph
CSEPED: 7 min
Estimated workload: 8.8 METS
Exercise duration (sec): 11 s
LVDIAVOL: 51 mL (ref 46–106)
LVSYSVOL: 23 mL
Peak HR: 137 {beats}/min
Percent HR: 81 %
Percent of predicted max HR: 81 %
SRS: 0
SSS: 1
Stage 1 Grade: 0 %
Stage 2 Grade: 0 %
Stage 3 DBP: 70 mmHg
Stage 3 HR: 90 {beats}/min
Stage 3 SBP: 117 mmHg
Stage 4 DBP: 78 mmHg
Stage 4 Grade: 12 %
Stage 5 HR: 137 {beats}/min
Stage 6 Speed: 0 mph
Stage 7 DBP: 71 mmHg
Stage 7 Grade: 0 %
TID: 1.09

## 2016-03-13 MED ORDER — TECHNETIUM TC 99M SESTAMIBI - CARDIOLITE: 14.2850 | Freq: Once | INTRAVENOUS | Status: DC | PRN

## 2016-03-13 MED ORDER — TECHNETIUM TC 99M SESTAMIBI - CARDIOLITE
13.0000 | Freq: Once | INTRAVENOUS | Status: AC | PRN
  Administered 2016-03-13: 08:00:00 14.258 via INTRAVENOUS

## 2016-07-11 ENCOUNTER — Other Ambulatory Visit: Payer: Self-pay | Admitting: Family Medicine

## 2016-07-11 MED ORDER — LATUDA 40 MG PO TABS: 40.0000 mg | ORAL_TABLET | Freq: Every day | ORAL | 0 refills | Status: DC

## 2016-07-11 MED ORDER — DIVALPROEX SODIUM ER 500 MG PO TB24: 1000.0000 mg | ORAL_TABLET | Freq: Every day | ORAL | 0 refills | Status: DC

## 2016-07-11 NOTE — Telephone Encounter (Signed)
Pt would like 3 month supply

## 2016-07-11 NOTE — Telephone Encounter (Signed)
Reviewed labs; I added [facility] as pharmacy and sent Rxs as requested

## 2016-08-29 ENCOUNTER — Other Ambulatory Visit: Payer: Self-pay | Admitting: Family Medicine

## 2016-08-30 NOTE — Telephone Encounter (Signed)
appt next week

## 2016-09-05 ENCOUNTER — Encounter: Payer: Self-pay | Admitting: Family Medicine

## 2016-09-05 ENCOUNTER — Ambulatory Visit (INDEPENDENT_AMBULATORY_CARE_PROVIDER_SITE_OTHER): Payer: 59 | Admitting: Family Medicine

## 2016-09-05 VITALS — BP 120/80 | HR 71 | Temp 99.5°F | Resp 14 | Wt 201.0 lb

## 2016-09-05 DIAGNOSIS — R350 Frequency of micturition: Secondary | ICD-10-CM | POA: Diagnosis not present

## 2016-09-05 DIAGNOSIS — Z5181 Encounter for therapeutic drug level monitoring: Secondary | ICD-10-CM | POA: Diagnosis not present

## 2016-09-05 DIAGNOSIS — F3176 Bipolar disorder, in full remission, most recent episode depressed: Secondary | ICD-10-CM

## 2016-09-05 DIAGNOSIS — G4700 Insomnia, unspecified: Secondary | ICD-10-CM

## 2016-09-05 DIAGNOSIS — R8290 Unspecified abnormal findings in urine: Secondary | ICD-10-CM | POA: Diagnosis not present

## 2016-09-05 DIAGNOSIS — E782 Mixed hyperlipidemia: Secondary | ICD-10-CM

## 2016-09-05 LAB — POCT URINALYSIS DIPSTICK
Blood, UA: NEGATIVE
GLUCOSE UA: NEGATIVE
Ketones, UA: 15
NITRITE UA: NEGATIVE
Protein, UA: NEGATIVE
Spec Grav, UA: <=1.005
Urobilinogen, UA: 0.2
pH, UA: 5

## 2016-09-05 MED ORDER — LATUDA 40 MG PO TABS: 40.0000 mg | ORAL_TABLET | Freq: Every day | ORAL | 3 refills | Status: DC

## 2016-09-05 MED ORDER — NITROFURANTOIN MONOHYD MACRO 100 MG PO CAPS: 100.0000 mg | ORAL_CAPSULE | Freq: Two times a day (BID) | ORAL | 0 refills | Status: AC

## 2016-09-05 MED ORDER — TRAZODONE HCL 50 MG PO TABS: 50.0000 mg | ORAL_TABLET | Freq: Every evening | ORAL | 3 refills | Status: DC | PRN

## 2016-09-05 MED ORDER — DIVALPROEX SODIUM ER 500 MG PO TB24
1000.0000 mg | ORAL_TABLET | Freq: Every day | ORAL | 3 refills | Status: DC
Start: 2016-09-05 — End: 2017-08-16

## 2016-09-05 NOTE — Progress Notes (Signed)
BP 120/80 (BP Location: Left Arm, Patient Position: Sitting, Cuff Size: Normal)   Pulse 71   Temp 99.5 F (37.5 C) (Oral)   Resp 14   Wt 201 lb (91.2 kg)   SpO2 95%   BMI 29.68 kg/m    Subjective:    Patient ID: Barbara [name], female    DOB: Mar 22, 1966, 50 y.o.   MRN: [id number]  HPI: Barbara [name] is a 50 y.o. female  Chief Complaint  Patient presents with  . Depression  . Urinary Tract Infection    POSS;  Smell to Urine    She thinks she has a bladder infection; urinary frequency; no blood in the urine; sx started 3 weeks ago; no dysuria; not emptying bladder completely  Insomnia; she has only been on that a few years, mostly sleep; able to sleep through the night most of the time; no grogginess; wishes to continue on same dose; avoids excessive caffeinei n the afternoons  Bipolar disorder; not sure if it runs in the family; got divorced when her son was 80 and son is now 48; diagnosed that year; has been hospitalized before; does not see psychiatrist b/c they dropped the insurance; doing well and stable on medicines; last swing was a depression with father's cancer diagnosis, melanoma; he went to [facility] for experimental drug, and had to have a feeding tube, can't drive, hospitalized over two months; he is at home recovering now  Hx of high chol; not fasting today  Depression screen [facility] 2/9 09/05/2016 03/06/2016 09/27/2015  Decreased Interest 0 0 0  Down, Depressed, Hopeless 0 0 0  PHQ - 2 Score 0 0 0   Relevant past medical, surgical, family and social history reviewed Past Medical History:  Diagnosis Date  . Depression   . Melanoma [facility])    Past Surgical History:  Procedure Laterality Date  . CERVICAL BIOPSY     Dr. Kenton Kingfisher with [facility]  . COLONOSCOPY    . TONSILLECTOMY     Family History  Problem Relation Age of Onset  . Heart attack Father   . Cancer Maternal Aunt     breast  . Cancer Paternal Aunt     breast  MD note: Father diagnosed  with melanoma  Social History  Substance Use Topics  . Smoking status: Former Research scientist (life sciences)  . Smokeless tobacco: Not on file  . Alcohol use No   Interim medical history since last visit reviewed. Allergies and medications reviewed  Review of Systems Per HPI unless specifically indicated above     Objective:    BP 120/80 (BP Location: Left Arm, Patient Position: Sitting, Cuff Size: Normal)   Pulse 71   Temp 99.5 F (37.5 C) (Oral)   Resp 14   Wt 201 lb (91.2 kg)   SpO2 95%   BMI 29.68 kg/m   Wt Readings from Last 3 Encounters:  09/05/16 201 lb (91.2 kg)  03/06/16 201 lb 8 oz (91.4 kg)  02/09/16 201 lb 8 oz (91.4 kg)    Physical Exam  Constitutional: She appears well-developed and well-nourished. No distress.  HENT:  Head: Normocephalic and atraumatic.  Eyes: EOM are normal. No scleral icterus.  Neck: No thyromegaly present.  Cardiovascular: Normal rate, regular rhythm and normal heart sounds.   No murmur heard. Pulmonary/Chest: Effort normal and breath sounds normal. No respiratory distress. She has no wheezes.  Abdominal: Soft. She exhibits no distension. There is no tenderness.  Neurological: She is alert. She exhibits normal  muscle tone.  Skin: Skin is warm and dry. She is not diaphoretic. No pallor.  Psychiatric: She has a normal mood and affect. Her behavior is normal. Judgment and thought content normal.   Results for orders placed or performed in visit on 09/05/16  POCT urinalysis dipstick  Result Value Ref Range   Color, UA yellow    Clarity, UA clear    Glucose, UA negative    Bilirubin, UA small    Ketones, UA 15    Spec Grav, UA <=1.005    Blood, UA negative    pH, UA 5.0    Protein, UA negative    Urobilinogen, UA 0.2    Nitrite, UA negatve    Leukocytes, UA moderate (2+) (A) Negative      Assessment & Plan:   Problem List Items Addressed This Visit      Other   Urine frequency - Primary    Culture pending; presence of leuk esterase suggests  UTI; start antibiotics      Relevant Orders   Urine Culture   Therapeutic drug monitoring    Check VPA level      Relevant Orders   CBC with Differential/Platelet   COMPLETE METABOLIC PANEL WITH GFR   Valproic Acid level   Medication monitoring encounter    Check labs to monitor renal and hepatic function      Relevant Orders   CBC with Differential/Platelet   COMPLETE METABOLIC PANEL WITH GFR   Valproic Acid level   Insomnia    Patient reports success with trazodone; will continue current dose; 90+3 RF provided today to mail order; avoid caffeine late in the day      Hyperlipidemia    Reviewed last lipid panel; LDL just over 130; patient was not fasting today so we opted to not check this; will be able to check when she has her physical      Bipolar 1 disorder, depressed, full remission ([place])    Patient has had dx for years; doing well on current medicine; she wishes to be seen yearly, and agrees to call or come in for an appointment with any changes; refills provided for 90+3 RF of her medicines      Relevant Medications   traZODone (DESYREL) 50 MG tablet    Other Visit Diagnoses    Foul smelling urine       Relevant Orders   POCT urinalysis dipstick (Completed)   Urine Culture       Follow up plan: Return in about 1 year (around 09/05/2017) for follow-up; complete physical when due.  An after-visit summary was printed and given to the patient at [place].  Please see the patient instructions which may contain other information and recommendations beyond what is mentioned above in the assessment and plan.  Meds ordered this encounter  Medications  . LATUDA 40 MG TABS tablet    Sig: Take 1 tablet (40 mg total) by mouth daily.    Dispense:  90 tablet    Refill:  3  . traZODone (DESYREL) 50 MG tablet    Sig: Take 1 tablet (50 mg total) by mouth at bedtime as needed for sleep.    Dispense:  90 tablet    Refill:  3  . divalproex (DEPAKOTE ER) 500 MG 24 hr  tablet    Sig: Take 2 tablets (1,000 mg total) by mouth daily.    Dispense:  180 tablet    Refill:  3  . nitrofurantoin, macrocrystal-monohydrate, (MACROBID) 100  MG capsule    Sig: Take 1 capsule (100 mg total) by mouth 2 (two) times daily.    Dispense:  6 capsule    Refill:  0    Orders Placed This Encounter  Procedures  . Urine Culture  . CBC with Differential/Platelet  . COMPLETE METABOLIC PANEL WITH GFR  . Valproic Acid level  . POCT urinalysis dipstick

## 2016-09-05 NOTE — Assessment & Plan Note (Addendum)
Culture pending; presence of leuk esterase suggests UTI; start antibiotics

## 2016-09-05 NOTE — Assessment & Plan Note (Signed)
Patient reports success with trazodone; will continue current dose; 90+3 RF provided today to mail order; avoid caffeine late in the day

## 2016-09-05 NOTE — Assessment & Plan Note (Signed)
Patient has had dx for years; doing well on current medicine; she wishes to be seen yearly, and agrees to call or come in for an appointment with any changes; refills provided for 90+3 RF of her medicines

## 2016-09-05 NOTE — Assessment & Plan Note (Signed)
Reviewed last lipid panel; LDL just over 130; patient was not fasting today so we opted to not check this; will be able to check when she has her physical

## 2016-09-05 NOTE — Patient Instructions (Addendum)
Check out any of the books by Ok Edwards Check out the song by Berenice Primas called Touched by the Nancy Fetter Continue current medicines We'll get labs today If you have not heard anything from my staff in a week about any orders/referrals/studies from today, please contact us here to follow-up [phone] Return for a physical when due

## 2016-09-05 NOTE — Assessment & Plan Note (Addendum)
Check labs to monitor renal and hepatic function

## 2016-09-05 NOTE — Assessment & Plan Note (Signed)
Check VPA level

## 2016-09-09 LAB — CBC WITH DIFFERENTIAL/PLATELET
BASOS ABS: 0 10*3/uL (ref 0.0–0.2)
BASOS: 0 %
EOS (ABSOLUTE): 0.2 10*3/uL (ref 0.0–0.4)
Eos: 3 %
Hematocrit: 40.1 % (ref 34.0–46.6)
Hemoglobin: 13.3 g/dL (ref 11.1–15.9)
IMMATURE GRANS (ABS): 0 10*3/uL (ref 0.0–0.1)
Immature Granulocytes: 0 %
LYMPHS: 41 %
Lymphocytes Absolute: 2.7 10*3/uL (ref 0.7–3.1)
MCH: 29.6 pg (ref 26.6–33.0)
MCHC: 33.2 g/dL (ref 31.5–35.7)
MCV: 89 fL (ref 79–97)
MONOS ABS: 0.7 10*3/uL (ref 0.1–0.9)
Monocytes: 11 %
NEUTROS ABS: 3 10*3/uL (ref 1.4–7.0)
NEUTROS PCT: 45 %
PLATELETS: 217 10*3/uL (ref 150–379)
RBC: 4.5 x10E6/uL (ref 3.77–5.28)
RDW: 14.5 % (ref 12.3–15.4)
WBC: 6.7 10*3/uL (ref 3.4–10.8)

## 2016-09-09 LAB — COMPREHENSIVE METABOLIC PANEL
A/G RATIO: 1.8 (ref 1.2–2.2)
ALK PHOS: 57 IU/L (ref 39–117)
ALT: 9 IU/L (ref 0–32)
AST: 16 IU/L (ref 0–40)
Albumin: 4.5 g/dL (ref 3.5–5.5)
BUN/Creatinine Ratio: 24 — ABNORMAL HIGH (ref 9–23)
BUN: 24 mg/dL (ref 6–24)
CALCIUM: 9.5 mg/dL (ref 8.7–10.2)
CHLORIDE: 102 mmol/L (ref 96–106)
CO2: 26 mmol/L (ref 18–29)
Creatinine, Ser: 0.99 mg/dL (ref 0.57–1.00)
GFR calc Af Amer: 76 mL/min/{1.73_m2} (ref >59)
GFR calc non Af Amer: 66 mL/min/{1.73_m2} (ref >59)
GLOBULIN, TOTAL: 2.5 g/dL (ref 1.5–4.5)
Glucose: 81 mg/dL (ref 65–99)
POTASSIUM: 4.6 mmol/L (ref 3.5–5.2)
SODIUM: 143 mmol/L (ref 134–144)
Total Protein: 7 g/dL (ref 6.0–8.5)

## 2016-09-09 LAB — URINE CULTURE

## 2016-09-09 LAB — VALPROIC ACID LEVEL: VALPROIC ACID LVL: 85 ug/mL (ref 50–100)

## 2016-12-13 ENCOUNTER — Telehealth: Payer: Self-pay

## 2016-12-13 NOTE — Telephone Encounter (Signed)
Patient called because she was at urgent care and diagnosis  was a sinus infection and has one more day of antibiotics. She stated she is still having trouble getting up without being dizziness. Patient also mention that someone state she could have vertigo and she ask me for advise. I mention to her that I can not give any medical advise over the phone and she would have to be seen in order determine if she does have vertigo.  She will like for you to call her.

## 2016-12-14 ENCOUNTER — Encounter: Payer: Self-pay | Admitting: Family Medicine

## 2016-12-14 ENCOUNTER — Ambulatory Visit (INDEPENDENT_AMBULATORY_CARE_PROVIDER_SITE_OTHER): Payer: 59 | Admitting: Family Medicine

## 2016-12-14 DIAGNOSIS — J0100 Acute maxillary sinusitis, unspecified: Secondary | ICD-10-CM

## 2016-12-14 MED ORDER — AZITHROMYCIN 250 MG PO TABS: ORAL_TABLET | ORAL | 0 refills | Status: DC

## 2016-12-14 NOTE — Telephone Encounter (Signed)
I returned her call; she is coming in today; she called [facility] and she had a URI and they called her in some prednisone and an inhaler and flonase She took the prednisone for 2 days, made her heart race, then went back to work on Monday; then went to urgent care and they "misdiagnosed" her; she had a sinus infection, gave her antibiotic for 10 days and told her to take aleve cold and sinus and vicks spray in nose for 4 days; then night before last, she almost fell on the floor, got up too fast maybe she thought; her friend thinks she has vertigo; she'll come in today and see provider

## 2016-12-14 NOTE — Progress Notes (Signed)
Name: Barbara [name]   MRN: [id number]    DOB: [birth year]   Date:12/14/2016       Progress Note  Subjective  Chief Complaint  Chief Complaint  Patient presents with  . Sinus Problem    sinus infection     Sinus Problem  This is a new problem. The current episode started 1 to 4 weeks ago (2 weeks ago). There has been no fever. Associated symptoms include ear pain (sensation that her ears need to pop but they wont, was told she had fluid in her ears), headaches and sinus pressure. Pertinent negatives include no chills, congestion, coughing or sore throat. (Also having episodes of light-headedness and 'walking like a drunk') Past treatments include antibiotics (Has been prescribed Doxycycline and FLonase by the Urgent Care, which did not provide any relief. ).     Past Medical History:  Diagnosis Date  . Depression   . Melanoma [facility])     Past Surgical History:  Procedure Laterality Date  . CERVICAL BIOPSY     Dr. Kenton Kingfisher with [facility]  . COLONOSCOPY    . TONSILLECTOMY      Family History  Problem Relation Age of Onset  . Heart attack Father   . Cancer Maternal Aunt     breast  . Cancer Paternal Aunt     breast    Social History   Social History  . Marital status: Divorced    Spouse name: N/A  . Number of children: N/A  . Years of education: N/A   Occupational History  . Not on file.   Social History Main Topics  . Smoking status: Former Research scientist (life sciences)  . Smokeless tobacco: Never Used  . Alcohol use No  . Drug use: No  . Sexual activity: No   Other Topics Concern  . Not on file   Social History Narrative  . No narrative on file     Current Outpatient Prescriptions:  .  Aspirin-Acetaminophen-Caffeine (EXCEDRIN MIGRAINE PO), Take 1-2 tablets by mouth daily as needed (for migraine)., Disp: , Rfl:  .  divalproex (DEPAKOTE ER) 500 MG 24 hr tablet, Take 2 tablets (1,000 mg total) by mouth daily., Disp: 180 tablet, Rfl: 3 .  ELIDEL 1 % cream, Apply 1 application  topically 2 (two) times daily as needed., Disp: , Rfl:  .  LATUDA 40 MG TABS tablet, Take 1 tablet (40 mg total) by mouth daily., Disp: 90 tablet, Rfl: 3 .  traZODone (DESYREL) 50 MG tablet, Take 1 tablet (50 mg total) by mouth at bedtime as needed for sleep., Disp: 90 tablet, Rfl: 3  Allergies  Allergen Reactions  . Penicillins Hives and Other (See Comments)    delusional  . Sulfa Antibiotics Hives and Other (See Comments)    dellusional  . Prednisone Other (See Comments)    Heart racing     Review of Systems  Constitutional: Negative for chills and fever.  HENT: Positive for ear pain (sensation that her ears need to pop but they wont, was told she had fluid in her ears), sinus pain and sinus pressure. Negative for congestion and sore throat.   Respiratory: Negative for cough and stridor.   Neurological: Positive for headaches.    Objective  Vitals:   12/14/16 1454  BP: 123/71  Pulse: 66  Resp: 16  Temp: 98.1 F (36.7 C)  TempSrc: Oral  SpO2: 96%  Weight: 202 lb 14.4 oz (92 kg)  Height: 5\' 9"  (1.753 m)    Physical Exam  Constitutional: She is oriented to person, place, and time and well-developed, well-nourished, and in no distress.  HENT:  Head: Normocephalic and atraumatic.  Right Ear: Tympanic membrane and ear canal normal. No drainage.  Nose: Right sinus exhibits maxillary sinus tenderness. Left sinus exhibits maxillary sinus tenderness.  Mouth/Throat: Posterior oropharyngeal erythema present.  Left TM has the appearance of bubbles in front of it, no erythema Nasal mucosal inflammation, turbinate hypertrophy  Cardiovascular: Normal rate, regular rhythm and normal heart sounds.   No murmur heard. Pulmonary/Chest: Effort normal and breath sounds normal.  Neurological: She is alert and oriented to person, place, and time.  Psychiatric: Mood, memory, affect and judgment normal.  Nursing note and vitals reviewed.      Assessment & Plan  1. Acute  non-recurrent maxillary sinusitis By symptoms and exam, suspect lightheadedness is also due to sinus inflammation. We'll start on Z-Pak for treatment. Advised to increase fluid intake. - azithromycin (ZITHROMAX) 250 MG tablet; 2 tabs po day 1, then 1 tab po q day x 4 days  Dispense: 6 tablet; Refill: 0   Viktoriya Glaspy [facility] 12/14/2016 3:11 PM

## 2016-12-22 ENCOUNTER — Telehealth: Payer: Self-pay | Admitting: Family Medicine

## 2016-12-22 NOTE — Telephone Encounter (Signed)
PT SAID THAT WE HAD PAPER WORK SENT TO [place] FOR HER HAVING TO BE OUT 3 CONSECUTIVE DAYS AND IT HAD TO BE TURNED BACK IN AND SHE SAID THAT IT HAS NOT BEEN DONE YET. pLEASE CALL HER ABOUT THIS.

## 2016-12-22 NOTE — Telephone Encounter (Signed)
Spoke with patient she has been notified that we did receive her FMLA paperwork how the print on the paperwork is not legible so she will be re-faxing forms and we will complete once they are received.

## 2016-12-26 ENCOUNTER — Telehealth: Payer: Self-pay | Admitting: Family Medicine

## 2016-12-26 NOTE — Telephone Encounter (Signed)
Dr Manuella Ghazi saw Dr Sanda Klein patient the other week and was suppose to fill out a form to send to the [employer]. As of today they have not received anything and if they do not get it by the 19th of this month she could lose her job. Please return call (W) [phone] or (C) [phone]. If you call after 3p call her cell

## 2016-12-26 NOTE — Telephone Encounter (Signed)
Routed to Dr. Manuella Ghazi for return call to patient concerning FMLA paperwork

## 2016-12-27 NOTE — Telephone Encounter (Signed)
FLMA paperwork has been completed and signed.

## 2016-12-28 NOTE — Telephone Encounter (Signed)
FMLA paperwork has been completed and signed per Dr. Manuella Ghazi, pt has been notified and paperwork has been faxed on 12/27/2016

## 2017-01-02 ENCOUNTER — Ambulatory Visit: Payer: 59 | Admitting: Family Medicine

## 2017-06-21 ENCOUNTER — Ambulatory Visit (INDEPENDENT_AMBULATORY_CARE_PROVIDER_SITE_OTHER): Payer: 59 | Admitting: Obstetrics & Gynecology

## 2017-06-21 ENCOUNTER — Encounter: Payer: Self-pay | Admitting: Obstetrics & Gynecology

## 2017-06-21 VITALS — BP 106/72 | HR 74 | Ht 69.0 in | Wt 210.0 lb

## 2017-06-21 DIAGNOSIS — Z1211 Encounter for screening for malignant neoplasm of colon: Secondary | ICD-10-CM

## 2017-06-21 DIAGNOSIS — Z1231 Encounter for screening mammogram for malignant neoplasm of breast: Secondary | ICD-10-CM | POA: Diagnosis not present

## 2017-06-21 DIAGNOSIS — Z0000 Encounter for general adult medical examination without abnormal findings: Secondary | ICD-10-CM | POA: Diagnosis not present

## 2017-06-21 DIAGNOSIS — Z1239 Encounter for other screening for malignant neoplasm of breast: Secondary | ICD-10-CM

## 2017-06-21 NOTE — Progress Notes (Signed)
HPI:      [name] is a 51 y.o. G1P1001 who LMP was in the past, she presents today for her annual examination.  The patient has no complaints today. The patient is sexually active. Herlast pap: approximate date 2016 and was normal and last mammogram: approximate date 2016 and was normal.  The patient does perform self breast exams.  There is no notable family history of breast or ovarian cancer in her family. The patient is not taking hormone replacement therapy. Patient denies post-menopausal vaginal bleeding.   The patient has regular exercise: yes. The patient denies current symptoms of depression.    GYN Hx: Last Colonoscopy:5 years ago. Normal.  Last DEXA: never ago.    PMHx: Past Medical History:  Diagnosis Date  . Adnexal mass   . Depression   . Depression   . Herpes   . Irregular menstrual bleeding   . Melanoma [facility])    Past Surgical History:  Procedure Laterality Date  . CERVICAL BIOPSY     Dr. Kenton Kingfisher with [facility]  . COLONOSCOPY    . COLONOSCOPY    . DILATION AND CURETTAGE OF UTERUS    . MELANOMA EXCISION    . TONSILLECTOMY     Family History  Problem Relation Age of Onset  . Heart attack Father   . Cancer Maternal Aunt        breast  . Cancer Paternal Aunt        breast   Social History  Substance Use Topics  . Smoking status: Former Research scientist (life sciences)  . Smokeless tobacco: Never Used  . Alcohol use No    Current Outpatient Prescriptions:  .  Aspirin-Acetaminophen-Caffeine (EXCEDRIN MIGRAINE PO), Take 1-2 tablets by mouth daily as needed (for migraine)., Disp: , Rfl:  .  azithromycin (ZITHROMAX) 250 MG tablet, 2 tabs po day 1, then 1 tab po q day x 4 days, Disp: 6 tablet, Rfl: 0 .  divalproex (DEPAKOTE ER) 500 MG 24 hr tablet, Take 2 tablets (1,000 mg total) by mouth daily., Disp: 180 tablet, Rfl: 3 .  ELIDEL 1 % cream, Apply 1 application topically 2 (two) times daily as needed., Disp: , Rfl:  .  LATUDA 40 MG TABS tablet, Take 1 tablet (40 mg  total) by mouth daily., Disp: 90 tablet, Rfl: 3 .  traZODone (DESYREL) 50 MG tablet, Take 1 tablet (50 mg total) by mouth at bedtime as needed for sleep., Disp: 90 tablet, Rfl: 3 Allergies: Penicillins; Sulfa antibiotics; and Prednisone  Review of Systems  Constitutional: Negative for chills, fever and malaise/fatigue.  HENT: Negative for congestion, sinus pain and sore throat.   Eyes: Negative for blurred vision and pain.  Respiratory: Negative for cough and wheezing.   Cardiovascular: Negative for chest pain and leg swelling.  Gastrointestinal: Negative for abdominal pain, constipation, diarrhea, heartburn, nausea and vomiting.  Genitourinary: Negative for dysuria, frequency, hematuria and urgency.  Musculoskeletal: Negative for back pain, joint pain, myalgias and neck pain.  Skin: Negative for itching and rash.  Neurological: Negative for dizziness, tremors and weakness.  Endo/Heme/Allergies: Does not bruise/bleed easily.  Psychiatric/Behavioral: Negative for depression. The patient is not nervous/anxious and does not have insomnia.     Objective: BP 106/72 (BP Location: Left Arm, Patient Position: Sitting, Cuff Size: Large)   Pulse 74   Ht 5\' 9"  (1.753 m)   Wt 210 lb (95.3 kg)   BMI 31.01 kg/m   Filed Weights   06/21/17 1537  Weight: 210 lb (  95.3 kg)   Body mass index is 31.01 kg/m. Physical Exam  Constitutional: She is oriented to person, place, and time. She appears well-developed and well-nourished. No distress.  Genitourinary: Rectum normal, vagina normal and uterus normal. Pelvic exam was performed with patient supine. There is no rash or lesion on the right labia. There is no rash or lesion on the left labia. Vagina exhibits no lesion. No bleeding in the vagina. Right adnexum does not display mass and does not display tenderness. Left adnexum does not display mass and does not display tenderness. Cervix does not exhibit motion tenderness, lesion, friability or polyp.    Uterus is mobile and midaxial. Uterus is not enlarged or exhibiting a mass.  HENT:  Head: Normocephalic and atraumatic. Head is without laceration.  Right Ear: Hearing normal.  Left Ear: Hearing normal.  Nose: No epistaxis.  No foreign bodies.  Mouth/Throat: Uvula is midline, oropharynx is clear and moist and mucous membranes are normal.  Eyes: Pupils are equal, round, and reactive to light.  Neck: Normal range of motion. Neck supple. No thyromegaly present.  Cardiovascular: Normal rate and regular rhythm.  Exam reveals no gallop and no friction rub.   No murmur heard. Pulmonary/Chest: Effort normal and breath sounds normal. No respiratory distress. She has no wheezes. Right breast exhibits no mass, no skin change and no tenderness. Left breast exhibits no mass, no skin change and no tenderness.  Abdominal: Soft. Bowel sounds are normal. She exhibits no distension. There is no tenderness. There is no rebound.  Musculoskeletal: Normal range of motion.  Neurological: She is alert and oriented to person, place, and time. No cranial nerve deficit.  Skin: Skin is warm and dry.  Psychiatric: She has a normal mood and affect. Judgment normal.  Vitals reviewed.   Assessment: Annual Exam 1. Annual physical exam   2. Screening for breast cancer   3. Screen for colon cancer     Plan:            1.  Cervical Screening-  Pap smear schedule reviewed with patient  2. Breast screening- Exam annually and mammogram scheduled  3. Colonoscopy every 10 years, Hemoccult testing after age 98  4. Labs managed by PCP  5. Counseling for hormonal therapy: none  6. Nocturia- monitor for now; meds options discussed  7. Weight gain, monitor, wants to lose on own, diet and exercise discussed.     F/U  Return in about 1 year (around 06/21/2018) for Annual.  Barnett Applebaum, MD, [facility], [facility] 06/21/2017  3:50 PM

## 2017-06-21 NOTE — Patient Instructions (Signed)
PAP every three years Mammogram every year    Call [phone] to schedule at [place] Colonoscopy every 10 years Labs yearly (with PCP) 

## 2017-08-16 ENCOUNTER — Encounter: Payer: Self-pay | Admitting: Family Medicine

## 2017-08-16 ENCOUNTER — Ambulatory Visit (INDEPENDENT_AMBULATORY_CARE_PROVIDER_SITE_OTHER): Payer: 59 | Admitting: Family Medicine

## 2017-08-16 VITALS — BP 102/70 | HR 87 | Temp 98.1°F | Resp 16 | Ht 68.25 in | Wt 211.5 lb

## 2017-08-16 DIAGNOSIS — R5383 Other fatigue: Secondary | ICD-10-CM

## 2017-08-16 DIAGNOSIS — E6609 Other obesity due to excess calories: Secondary | ICD-10-CM | POA: Diagnosis not present

## 2017-08-16 DIAGNOSIS — Z5181 Encounter for therapeutic drug level monitoring: Secondary | ICD-10-CM

## 2017-08-16 DIAGNOSIS — Z6831 Body mass index (BMI) 31.0-31.9, adult: Secondary | ICD-10-CM

## 2017-08-16 DIAGNOSIS — E782 Mixed hyperlipidemia: Secondary | ICD-10-CM

## 2017-08-16 DIAGNOSIS — F3176 Bipolar disorder, in full remission, most recent episode depressed: Secondary | ICD-10-CM | POA: Diagnosis not present

## 2017-08-16 DIAGNOSIS — G4700 Insomnia, unspecified: Secondary | ICD-10-CM | POA: Diagnosis not present

## 2017-08-16 MED ORDER — DIVALPROEX SODIUM ER 500 MG PO TB24: 1000.0000 mg | ORAL_TABLET | Freq: Every day | ORAL | 3 refills | Status: DC

## 2017-08-16 MED ORDER — TRAZODONE HCL 100 MG PO TABS: 100.0000 mg | ORAL_TABLET | Freq: Every day | ORAL | 0 refills | Status: DC

## 2017-08-16 MED ORDER — LATUDA 40 MG PO TABS: 40.0000 mg | ORAL_TABLET | Freq: Every day | ORAL | 3 refills | Status: DC

## 2017-08-16 NOTE — Assessment & Plan Note (Signed)
Refer to psychiatrist 

## 2017-08-16 NOTE — Assessment & Plan Note (Signed)
Increase trazodone from 50 mg to 100 mg at bedtime; mindfulness, process thoughts early before bed

## 2017-08-16 NOTE — Progress Notes (Signed)
BP 102/70   Pulse 87   Temp 98.1 F (36.7 C) (Oral)   Resp 16   Ht 5' 8.25" (1.734 m)   Wt 211 lb 8 oz (95.9 kg)   SpO2 93%   BMI 31.92 kg/m    Subjective:    Patient ID: Barbara [name], female    DOB: 16-Jul-1966, 51 y.o.   MRN: [id number]  HPI: Barbara [name] is a 51 y.o. female  Chief Complaint  Patient presents with  . Medication Refill  . paperwork    BMI Appeal   HPI Patient is here for an appeal for her BMI She has paperwork that needs to be signed to say she is going to lose weight She has been eating salads Trying since the screening was done on 06/27/17 Her weight was 206.8 pounds It is 211.5 pounds today, so she's actually gained weight since then She walks, but not enough she admits Her brother just had a heart attack in July She has been at [employer] for 21 years; weighed less than 160 pounds since working there She has gone through menopause No known family hx of hypothyroidism Father had melanoma and diabetes Aunts with breast cancer 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, father has that too Her BP was high at the screening because she was upset Reviewed lipids Glucose and A1c were normal Eats with mother because she is single; mother is cooking Financial trader on crackers Drinks diet Pepsi and water; not drinking her calories She would like to start walking; safe place to walk  She is a Research officer, trade union; worries about everything that she can't control; she thinks that the trazodone isn't helping She has not been on the latuda that long; on the depakote Father has cancer; worries about him, everything in general She used to see a psychiatrist in [place] but it wasn't really helping If she falls asleep early, might sleep for 4 hours and then wake up  Depression screen [facility] 2/9 08/16/2017 12/14/2016 09/05/2016 03/06/2016 09/27/2015  Decreased Interest 0 0 0 0 0  Down, Depressed, Hopeless 0 0 0 0 0  PHQ - 2 Score 0 0 0 0 0    Relevant past medical,  surgical, family and social history reviewed Past Medical History:  Diagnosis Date  . Adnexal mass   . Depression   . Depression   . Herpes   . Irregular menstrual bleeding   . Melanoma [facility])    Past Surgical History:  Procedure Laterality Date  . CERVICAL BIOPSY     Dr. Kenton Kingfisher with [facility]  . COLONOSCOPY    . COLONOSCOPY    . DILATION AND CURETTAGE OF UTERUS    . MELANOMA EXCISION    . TONSILLECTOMY     Family History  Problem Relation Age of Onset  . Heart attack Father   . Cancer Father        melanoma  . Cancer Maternal Aunt        breast  . Cancer Paternal Aunt        breast   Social History   Social History  . Marital status: Divorced    Spouse name: N/A  . Number of children: N/A  . Years of education: N/A   Occupational History  . Not on file.   Social History Main Topics  . Smoking status: Former Research scientist (life sciences)  . Smokeless tobacco: Never Used  . Alcohol use No  . Drug use: No  . Sexual activity: No  Other Topics Concern  . Not on file   Social History Narrative  . No narrative on file    Interim medical history since last visit reviewed. Allergies and medications reviewed  Review of Systems Per HPI unless specifically indicated above     Objective:    BP 102/70   Pulse 87   Temp 98.1 F (36.7 C) (Oral)   Resp 16   Ht 5' 8.25" (1.734 m)   Wt 211 lb 8 oz (95.9 kg)   SpO2 93%   BMI 31.92 kg/m   Wt Readings from Last 3 Encounters:  08/16/17 211 lb 8 oz (95.9 kg)  06/21/17 210 lb (95.3 kg)  12/14/16 202 lb 14.4 oz (92 kg)    Physical Exam  Constitutional: She appears well-developed and well-nourished. No distress.  HENT:  Head: Normocephalic and atraumatic.  Eyes: No scleral icterus.  Neck: No thyromegaly present.  Cardiovascular: Normal rate and regular rhythm.   Pulmonary/Chest: Effort normal and breath sounds normal.  Abdominal: She exhibits no distension.  Musculoskeletal: She exhibits no edema.  Neurological: She is  alert.  Skin: Skin is warm and dry. No pallor.  Psychiatric: She has a normal mood and affect.       Assessment & Plan:   Problem List Items Addressed This Visit      Other   Therapeutic drug monitoring    Check level, liver enzymes      Relevant Orders   Hepatic function panel   Basic Metabolic Panel (BMET)   Other fatigue    Check tsh      Relevant Orders   TSH   Obesity - Primary    Check TSH to rule-out thyroid disease; guidance given about weight loss; come up with healthier meal plans; build up gradually to 30 minutes a day on five or more days per week; hydration important; f/u in 3 months to see if losing 12-13 pounds by then, 1 pound per week      Medication monitoring encounter    Check liver      Relevant Orders   Hepatic function panel   Basic Metabolic Panel (BMET)   Insomnia    Increase trazodone from 50 mg to 100 mg at bedtime; mindfulness, process thoughts early before bed      Relevant Orders   TSH   Hyperlipidemia    Should improve with weight loss      Bipolar 1 disorder, depressed, full remission ([place])    Refer to psychiatrist      Relevant Orders   TSH   Valproic acid level   Ambulatory referral to Psychiatry       Follow up plan: Return in about 14 weeks (around 11/22/2017) for follow-up visit with Dr. Sanda Klein.  An after-visit summary was printed and given to the patient at [place].  Please see the patient instructions which may contain other information and recommendations beyond what is mentioned above in the assessment and plan.  Meds ordered this encounter  Medications  . divalproex (DEPAKOTE ER) 500 MG 24 hr tablet    Sig: Take 2 tablets (1,000 mg total) by mouth daily.    Dispense:  180 tablet    Refill:  3  . LATUDA 40 MG TABS tablet    Sig: Take 1 tablet (40 mg total) by mouth daily.    Dispense:  90 tablet    Refill:  3  . traZODone (DESYREL) 100 MG tablet    Sig: Take 1 tablet (100 mg  total) by mouth at bedtime.     Dispense:  90 tablet    Refill:  0    Orders Placed This Encounter  Procedures  . TSH  . Hepatic function panel  . Valproic acid level  . Basic Metabolic Panel (BMET)  . Ambulatory referral to Psychiatry   Face-to-face time with patient was more than 25 minutes, >50% time spent counseling and coordination of care

## 2017-08-16 NOTE — Assessment & Plan Note (Signed)
Check liver 

## 2017-08-16 NOTE — Assessment & Plan Note (Signed)
Check tsh 

## 2017-08-16 NOTE — Assessment & Plan Note (Signed)
Should improve with weight loss

## 2017-08-16 NOTE — Assessment & Plan Note (Signed)
Check TSH to rule-out thyroid disease; guidance given about weight loss; come up with healthier meal plans; build up gradually to 30 minutes a day on five or more days per week; hydration important; f/u in 3 months to see if losing 12-13 pounds by then, 1 pound per week

## 2017-08-16 NOTE — Patient Instructions (Addendum)
198 pounds is our goal Return in 14 weeks Check out the information at familydoctor.org entitled "Nutrition for Weight Loss: What You Need to Know about Fad Diets" Try to lose between 1-2 pounds per week by taking in fewer calories and burning off more calories You can succeed by limiting portions, limiting foods dense in calories and fat, becoming more active, and drinking 8 glasses of water a day (64 ounces) Don't skip meals, especially breakfast, as skipping meals may alter your metabolism Do not use over-the-counter weight loss pills or gimmicks that claim rapid weight loss A healthy BMI (or body mass index) is between 18.5 and 24.9 You can calculate your ideal BMI at the [place] website [employer]Monet[employer]fr Our ultimate goal in a year or less is for you to be down to 165 pounds or less We'll get labs today We'll have you see the psychiatrist I believe in you!

## 2017-08-16 NOTE — Assessment & Plan Note (Signed)
Check level, liver enzymes

## 2017-08-18 LAB — BASIC METABOLIC PANEL
BUN/Creatinine Ratio: 24 — ABNORMAL HIGH (ref 9–23)
BUN: 24 mg/dL (ref 6–24)
CO2: 24 mmol/L (ref 20–29)
Calcium: 9.8 mg/dL (ref 8.7–10.2)
Chloride: 101 mmol/L (ref 96–106)
Creatinine, Ser: 1 mg/dL (ref 0.57–1.00)
GFR calc Af Amer: 74 mL/min/{1.73_m2} (ref >59)
GFR calc non Af Amer: 64 mL/min/{1.73_m2} (ref >59)
Glucose: 82 mg/dL (ref 65–99)
Potassium: 4.6 mmol/L (ref 3.5–5.2)
Sodium: 141 mmol/L (ref 134–144)

## 2017-08-18 LAB — HEPATIC FUNCTION PANEL
ALT: 11 IU/L (ref 0–32)
AST: 19 IU/L (ref 0–40)
Albumin: 4.7 g/dL (ref 3.5–5.5)
Alkaline Phosphatase: 42 IU/L (ref 39–117)
Bilirubin Total: 0.3 mg/dL (ref 0.0–1.2)
Bilirubin, Direct: 0.09 mg/dL (ref 0.00–0.40)
Total Protein: 7.4 g/dL (ref 6.0–8.5)

## 2017-08-18 LAB — TSH: TSH: 4.03 u[IU]/mL (ref 0.450–4.500)

## 2017-08-18 LAB — VALPROIC ACID LEVEL: Valproic Acid Lvl: 63 ug/mL (ref 50–100)

## 2017-09-06 ENCOUNTER — Emergency Department: Payer: 59

## 2017-09-06 ENCOUNTER — Encounter: Payer: Self-pay | Admitting: Emergency Medicine

## 2017-09-06 ENCOUNTER — Emergency Department
Admission: EM | Admit: 2017-09-06 | Discharge: 2017-09-06 | Disposition: A | Discharge status: Home / Self Care | Payer: 59 | Attending: Emergency Medicine | Admitting: Emergency Medicine

## 2017-09-06 DIAGNOSIS — Z87891 Personal history of nicotine dependence: Secondary | ICD-10-CM | POA: Insufficient documentation

## 2017-09-06 DIAGNOSIS — R079 Chest pain, unspecified: Secondary | ICD-10-CM | POA: Insufficient documentation

## 2017-09-06 DIAGNOSIS — K219 Gastro-esophageal reflux disease without esophagitis: Secondary | ICD-10-CM | POA: Insufficient documentation

## 2017-09-06 DIAGNOSIS — Z85820 Personal history of malignant melanoma of skin: Secondary | ICD-10-CM | POA: Diagnosis not present

## 2017-09-06 DIAGNOSIS — R12 Heartburn: Secondary | ICD-10-CM | POA: Diagnosis present

## 2017-09-06 DIAGNOSIS — F319 Bipolar disorder, unspecified: Secondary | ICD-10-CM | POA: Insufficient documentation

## 2017-09-06 LAB — BASIC METABOLIC PANEL
ANION GAP: 10 (ref 5–15)
BUN: 21 mg/dL — AB (ref 6–20)
CALCIUM: 9.3 mg/dL (ref 8.9–10.3)
CO2: 27 mmol/L (ref 22–32)
CREATININE: 1.16 mg/dL — AB (ref 0.44–1.00)
Chloride: 103 mmol/L (ref 101–111)
GFR calc Af Amer: >60 mL/min (ref >60)
GFR, EST NON AFRICAN AMERICAN: 53 mL/min — AB (ref >60)
GLUCOSE: 87 mg/dL (ref 65–99)
Potassium: 4.5 mmol/L (ref 3.5–5.1)
Sodium: 140 mmol/L (ref 135–145)

## 2017-09-06 LAB — TROPONIN I: Troponin I: <0.03 ng/mL (ref <0.03)

## 2017-09-06 LAB — CBC
HCT: 43 % (ref 35.0–47.0)
HEMOGLOBIN: 14.3 g/dL (ref 12.0–16.0)
MCH: 29.7 pg (ref 26.0–34.0)
MCHC: 33.2 g/dL (ref 32.0–36.0)
MCV: 89.3 fL (ref 80.0–100.0)
Platelets: 152 10*3/uL (ref 150–440)
RBC: 4.81 MIL/uL (ref 3.80–5.20)
RDW: 14 % (ref 11.5–14.5)
WBC: 5.6 10*3/uL (ref 3.6–11.0)

## 2017-09-06 MED ORDER — GI COCKTAIL ~~LOC~~
30.0000 mL | Freq: Once | ORAL | Status: AC
  Administered 2017-09-06: 30 mL via ORAL
  Filled 2017-09-06: qty 30

## 2017-09-06 MED ORDER — SUCRALFATE 1 G PO TABS: 1.0000 g | ORAL_TABLET | Freq: Four times a day (QID) | ORAL | 1 refills | Status: DC

## 2017-09-06 MED ORDER — PANTOPRAZOLE SODIUM 40 MG PO TBEC: 40.0000 mg | DELAYED_RELEASE_TABLET | Freq: Every day | ORAL | 1 refills | Status: DC

## 2017-09-06 NOTE — ED Provider Notes (Signed)
[facility] Emergency Department Provider Note       Time seen: ----------------------------------------- 1:51 PM on 09/06/2017 -----------------------------------------     I have reviewed the triage vital signs and the nursing notes.   HISTORY   Chief Complaint Heartburn    HPI Barbara [name] is a 51 y.o. female with a history of bipolar disorder and possible hyperlipidemia who presents to the ED for heartburn and chest pain on and off for 2 days. Patient feels a pressure in her chest and some burning that she cannot attribute to any particular thing. She states her friend gave her a Tagamet which seemed to temporarily alleviate her symptoms. She denies any chest pain at this time.  Past Medical History:  Diagnosis Date  . Adnexal mass   . Depression   . Depression   . Herpes   . Irregular menstrual bleeding   . Melanoma [facility])     Patient Active Problem List   Diagnosis Date Noted  . Urine frequency 09/05/2016  . Medication monitoring encounter 09/05/2016  . Therapeutic drug monitoring 09/05/2016  . Chest pain 02/09/2016  . Hyperlipidemia 02/09/2016  . Obesity 02/09/2016  . Insomnia 09/27/2015  . Bipolar 1 disorder, depressed, full remission ([place]) 09/27/2015  . Encounter for cholesteral screening for cardiovascular disease 09/27/2015  . Other fatigue 09/27/2015    Past Surgical History:  Procedure Laterality Date  . CERVICAL BIOPSY     Dr. Kenton Kingfisher with [facility]  . COLONOSCOPY    . COLONOSCOPY    . DILATION AND CURETTAGE OF UTERUS    . MELANOMA EXCISION    . TONSILLECTOMY      Allergies Penicillins; Sulfa antibiotics; and Prednisone  Social History Social History  Substance Use Topics  . Smoking status: Former Research scientist (life sciences)  . Smokeless tobacco: Never Used  . Alcohol use No    Review of Systems Constitutional: Negative for fever. Cardiovascular: Positive for chest pain Respiratory: Negative for shortness of  breath. Gastrointestinal: Negative for abdominal pain, vomiting and diarrhea. Genitourinary: Negative for dysuria. Musculoskeletal: Negative for back pain. Skin: Negative for rash. Neurological: Negative for headaches, focal weakness or numbness.  All systems negative/normal/unremarkable except as stated in the HPI  ____________________________________________   PHYSICAL EXAM:  VITAL SIGNS: ED Triage Vitals [09/06/17 1145]  Enc Vitals Group     BP (!) 154/81     Pulse Rate 71     Resp 20     Temp 97.7 F (36.5 C)     Temp Source Oral     SpO2 100 %     Weight 211 lb (95.7 kg)     Height      Head Circumference      Peak Flow      Pain Score      Pain Loc      Pain Edu?      Excl. in [place]?     Constitutional: Alert and oriented. Well appearing and in no distress. Eyes: Conjunctivae are normal. Normal extraocular movements. ENT   Head: Normocephalic and atraumatic.   Nose: No congestion/rhinnorhea.   Mouth/Throat: Mucous membranes are moist.   Neck: No stridor. Cardiovascular: Normal rate, regular rhythm. No murmurs, rubs, or gallops. Respiratory: Normal respiratory effort without tachypnea nor retractions. Breath sounds are clear and equal bilaterally. No wheezes/rales/rhonchi. Gastrointestinal: Soft and nontender. Normal bowel sounds Musculoskeletal: Nontender with normal range of motion in extremities. No lower extremity tenderness nor edema. Neurologic:  Normal speech and language. No gross focal neurologic  deficits are appreciated.  Skin:  Skin is warm, dry and intact. No rash noted. Psychiatric: Mood and affect are normal. Speech and behavior are normal.  ____________________________________________  EKG: Interpreted by me. Sinus rhythm rate of 61 bpm, normal PR interval, normal QRS, normal QT.  ____________________________________________  ED COURSE:  Pertinent labs & imaging results that were available during my care of the patient were reviewed  by me and considered in my medical decision making (see chart for details). Patient presents for nonspecific chest pain, we will assess with labs and imaging as indicated.   Procedures ____________________________________________   LABS (pertinent positives/negatives)  Labs Reviewed  BASIC METABOLIC PANEL - Abnormal; Notable for the following:       Result Value   BUN 21 (*)    Creatinine, Ser 1.16 (*)    GFR calc non Af Amer 53 (*)    All other components within normal limits  CBC  TROPONIN I    RADIOLOGY  Chest x-ray is normal  ____________________________________________  DIFFERENTIAL DIAGNOSIS   GERD, peptic ulcer disease, unstable angina, PE, MI, dissection   FINAL ASSESSMENT AND PLAN  Chest pain, GERD   Plan: Patient had presented for chest pain which appears to be GI related. Patients labs were reassuring. Patients imaging was also reassuring. Her symptoms seem to be GERD related and she will be treated as such. She did have some improvement with a GI cocktail. She was discharged with Protonix and Carafate and will be referred to GI for follow-up.   Earleen Newport, MD   Note: This note was generated in part or whole with voice recognition software. Voice recognition is usually quite accurate but there are transcription errors that can and very often do occur. I apologize for any typographical errors that were not detected and corrected.     Earleen Newport, MD 09/06/17 [phone]

## 2017-09-06 NOTE — ED Triage Notes (Signed)
Pt c/o heartburn/ chest pain on and off for two days. States her friend gave her something for heart burn with no relief. Pt denies any chest pain at this time.

## 2017-09-27 ENCOUNTER — Ambulatory Visit: Payer: 59 | Admitting: Gastroenterology

## 2017-09-27 ENCOUNTER — Encounter: Payer: Self-pay | Admitting: Gastroenterology

## 2017-09-27 VITALS — BP 133/77 | HR 71 | Temp 97.7°F | Ht 68.25 in | Wt 207.4 lb

## 2017-09-27 DIAGNOSIS — F32A Depression, unspecified: Secondary | ICD-10-CM | POA: Insufficient documentation

## 2017-09-27 DIAGNOSIS — K219 Gastro-esophageal reflux disease without esophagitis: Secondary | ICD-10-CM

## 2017-09-27 DIAGNOSIS — A048 Other specified bacterial intestinal infections: Secondary | ICD-10-CM | POA: Insufficient documentation

## 2017-09-27 DIAGNOSIS — K635 Polyp of colon: Secondary | ICD-10-CM | POA: Insufficient documentation

## 2017-09-27 DIAGNOSIS — F329 Major depressive disorder, single episode, unspecified: Secondary | ICD-10-CM | POA: Insufficient documentation

## 2017-09-27 NOTE — Progress Notes (Signed)
Jonathon Bellows MD, MRCP(U.K) [place]  [place], [place] [address]  Main: [phone]  Fax: [phone]   Gastroenterology Consultation  Referring Provider:     Arnetha Courser, MD Primary Care Physician:  Arnetha Courser, MD Primary Gastroenterologist:  Dr. Jonathon Bellows  Reason for Consultation:     Heartburn        HPI:   Barbara [name] is a 51 y.o. y/o female referred for consultation & management  by Dr. Arnetha Courser, MD.  She presented to the ER on 09/06/17 with chest pain , heartburn . Given a GI cocktail and discharged with PPI and carafate.  She says that last year had a similar episode which took her the ER when she felt like she couldn't breathe, underwent am EKG, CXR was normal . Felt like something was sitting on her chest . Has had cardiac evaluation and stress test in 03/2016 which was negative. She is trying to lose weight . On Protonix 40 mg in the morning . No symptoms on PPI  Past Medical History:  Diagnosis Date  . Adnexal mass   . Depression   . Depression   . Herpes   . Irregular menstrual bleeding   . Melanoma [facility])     Past Surgical History:  Procedure Laterality Date  . CERVICAL BIOPSY     Dr. Kenton Kingfisher with [facility]  . COLONOSCOPY    . COLONOSCOPY    . DILATION AND CURETTAGE OF UTERUS    . MELANOMA EXCISION    . TONSILLECTOMY      Prior to Admission medications   Medication Sig Start Date End Date Taking? Authorizing Provider  divalproex (DEPAKOTE ER) 500 MG 24 hr tablet Take 2 tablets (1,000 mg total) by mouth daily. 08/16/17  Yes Lada, Satira Anis, MD  doxycycline (VIBRAMYCIN) 100 MG capsule TAKE ONE PILL TWICE DAILY WITH FOOD AND FULL GLASS OF WATER X 10 DAYS 09/24/17  Yes [provider]  ibuprofen (ADVIL,MOTRIN) 200 MG tablet Take by mouth.   Yes [provider]  LATUDA 40 MG TABS tablet Take 1 tablet (40 mg total) by mouth daily. 08/16/17  Yes Lada, Satira Anis, MD  naltrexone (DEPADE) 50 MG tablet  Take 50 mg daily by mouth.   Yes [provider]  sucralfate (CARAFATE) 1 g tablet Take 1 tablet (1 g total) by mouth 4 (four) times daily. 09/06/17 09/06/18 Yes Earleen Newport, MD  traZODone (DESYREL) 100 MG tablet Take 1 tablet (100 mg total) by mouth at bedtime. 08/16/17  Yes Lada, Satira Anis, MD  fluticasone (FLONASE) 50 MCG/ACT nasal spray Place into the nose. 02/11/16   [provider]  pantoprazole (PROTONIX) 40 MG tablet Take 1 tablet (40 mg total) by mouth daily. Patient not taking: Reported on 09/27/2017 09/06/17 09/06/18  Earleen Newport, MD    Family History  Problem Relation Age of Onset  . Heart attack Father   . Cancer Father        melanoma  . Cancer Maternal Aunt        breast  . Cancer Paternal Aunt        breast     Social History   Tobacco Use  . Smoking status: Former Research scientist (life sciences)  . Smokeless tobacco: Never Used  Substance Use Topics  . Alcohol use: No    Alcohol/week: 0.0 oz  . Drug use: No    Allergies as of 09/27/2017 - Review Complete 09/27/2017  Allergen Reaction Noted  . Penicillins Hives and Other (See Comments) 09/27/2015  . Sulfa antibiotics Hives and Other (See Comments) 09/27/2015  . Prednisone Other (See Comments) 12/04/2016    Review of Systems:    All systems reviewed and negative except where noted in HPI.   Physical Exam:  BP 133/77   Pulse 71   Temp 97.7 F (36.5 C) (Oral)   Ht 5' 8.25" (1.734 m)   Wt 207 lb 6.4 oz (94.1 kg)   BMI 31.30 kg/m  No LMP recorded. Patient is postmenopausal. Psych:  Alert and cooperative. Normal mood and affect. General:   Alert,  Well-developed, well-nourished, pleasant and cooperative in NAD Head:  Normocephalic and atraumatic. Eyes:  Sclera clear, no icterus.   Conjunctiva pink. Ears:  Normal auditory acuity. Nose:  No deformity, discharge, or lesions. Mouth:  No deformity or lesions,oropharynx pink & moist. Neck:  Supple; no masses or thyromegaly. Lungs:  Respirations  even and unlabored.  Clear throughout to auscultation.   No wheezes, crackles, or rhonchi. No acute distress. Heart:  Regular rate and rhythm; no murmurs, clicks, rubs, or gallops. Abdomen:  Normal bowel sounds.  No bruits.  Soft, non-tender and non-distended without masses, hepatosplenomegaly or hernias noted.  No guarding or rebound tenderness.    Neurologic:  Alert and oriented x3;  grossly normal neurologically. Skin:  Intact without significant lesions or rashes. No jaundice. Lymph Nodes:  No significant cervical adenopathy. Psych:  Alert and cooperative. Normal mood and affect.  Imaging Studies: Dg Chest 2 View  Result Date: 09/06/2017 CLINICAL DATA:  Heartburn. EXAM: CHEST  2 VIEW COMPARISON:  Chest x-ray dated February 04, 2016. FINDINGS: The cardiomediastinal silhouette is normal in size. Normal pulmonary vascularity. No focal consolidation, pleural effusion, or pneumothorax. No acute osseous abnormality. IMPRESSION: No active cardiopulmonary disease. Electronically Signed   By: Titus Dubin M.D.   On: 09/06/2017 12:33    Assessment and Plan:   MAYRENE BASTARACHE is a 51 y.o. y/o female has been referred for heartburn/chest pain . Counseled on life style changes, suggest to use PPI first thing in the morning on empty stomach and eat 30 minutes after. Advised on the use of a wedge pillow at night , avoid meals for 2 hours prior to bed time. Weight loss .Discussed the risks and benefits of long term PPI use including but not limited to bone loss, chronic kidney disease, infections , low magnesium . Aim to use at the lowest dose for the shortest period of time   Will plan to decrease dose of PPI at next visit   Follow up in 4 months   Dr Jonathon Bellows MD,MRCP(U.K)

## 2017-10-13 IMAGING — CR DG CHEST 2V
2 series · 2 of 2 positions shown · non-contrast
Comparison: None.

CLINICAL DATA: GE reflux symptoms with indigestion, associated with
chest tightness, which began acutely at 3 o'clock a.m..

EXAM:
CHEST  2 VIEW

[chest pa]
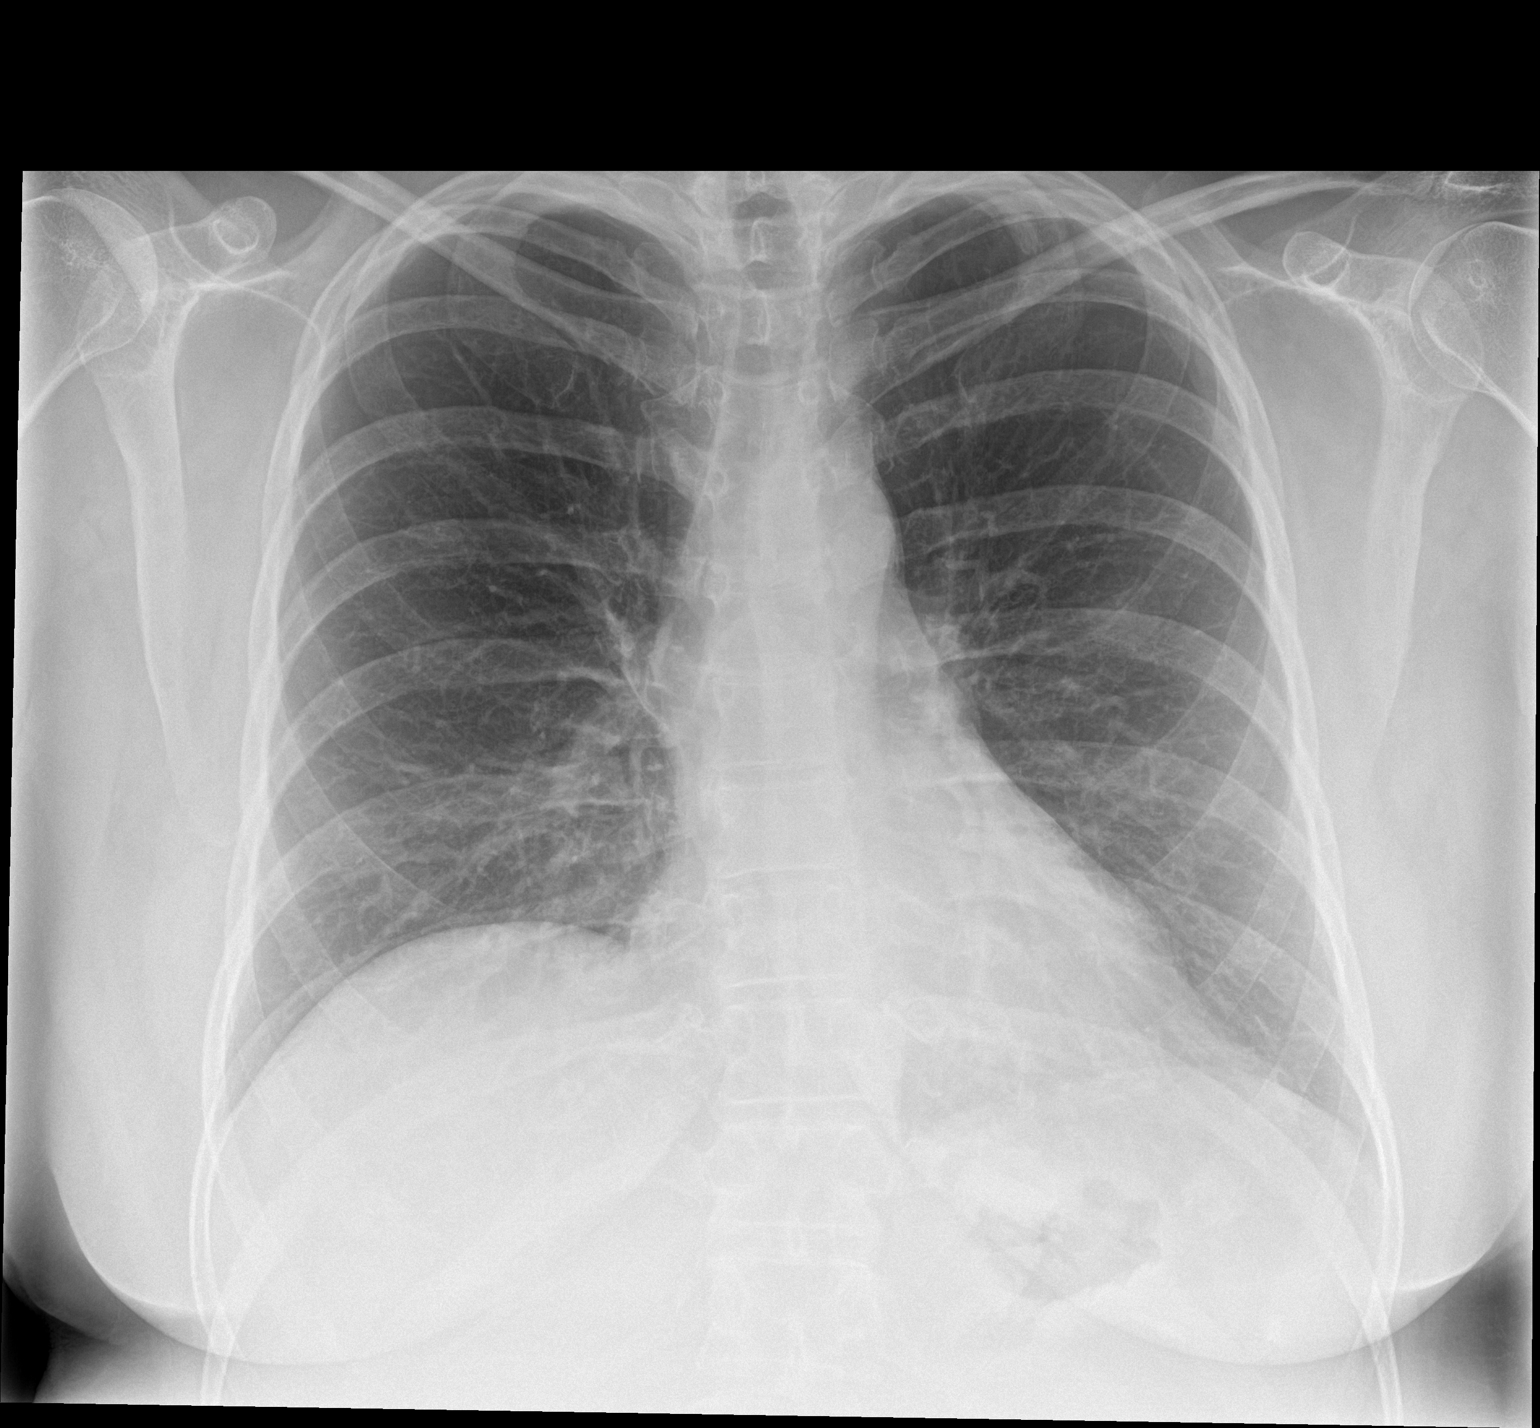

[chest lat]
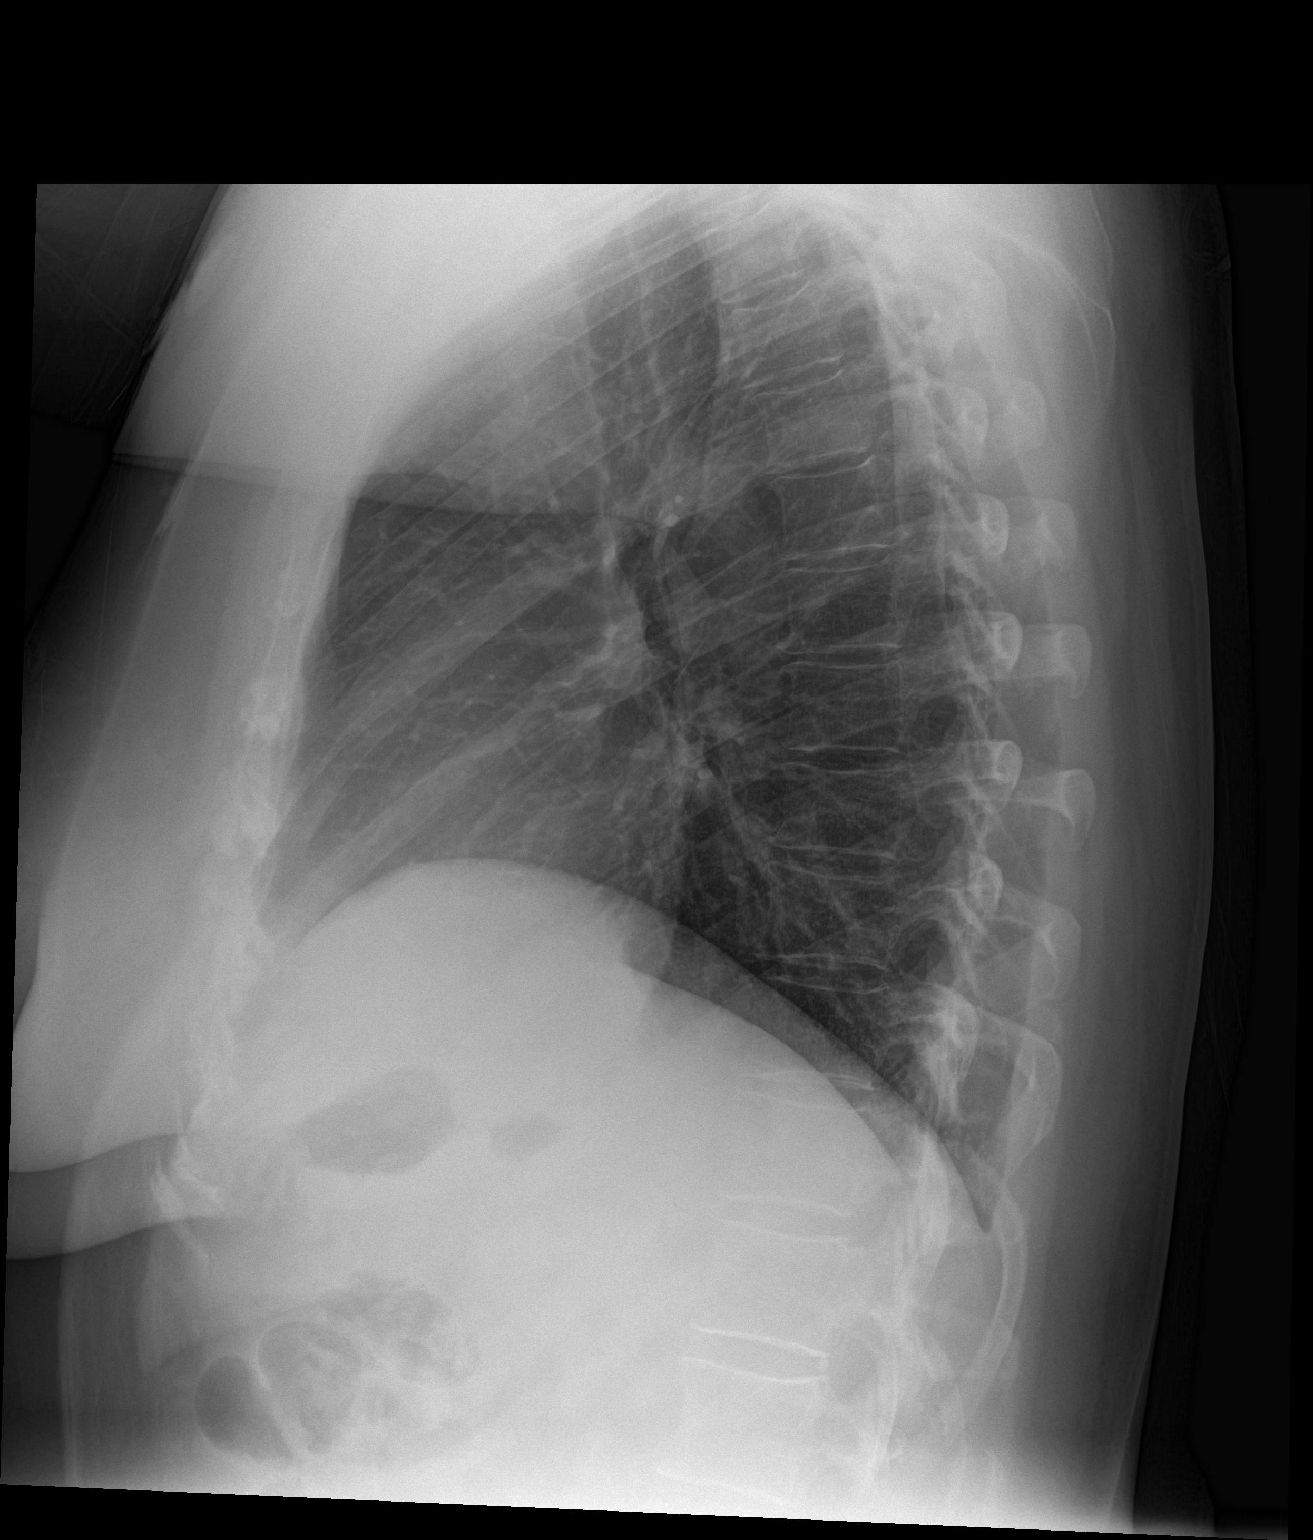

[2 of 2 positions shown; findings below may reference images not displayed]

FINDINGS: Cardiomediastinal silhouette unremarkable. Linear scar or
atelectasis centrally in the right upper lobe. Lungs otherwise
clear. Bronchovascular markings normal. Pulmonary vascularity
normal. No visible pleural effusions. No pneumothorax. Mild
eventration of the right anterior hemidiaphragm. Visualized bony
thorax intact.
IMPRESSION: No acute cardiopulmonary disease.

## 2017-11-23 ENCOUNTER — Ambulatory Visit: Payer: 59 | Admitting: Family Medicine

## 2017-11-26 ENCOUNTER — Ambulatory Visit: Payer: 59 | Admitting: Gastroenterology

## 2017-11-26 ENCOUNTER — Encounter: Payer: Self-pay | Admitting: Gastroenterology

## 2017-12-25 ENCOUNTER — Telehealth: Payer: Self-pay | Admitting: Family Medicine

## 2017-12-25 NOTE — Telephone Encounter (Signed)
Copied from [place] [id number]. Topic: Quick Communication - See Telephone Encounter >> Dec 25, 2017  9:50 AM Bea Graff, NT wrote: CRM for notification. See Telephone encounter for: PT would like her LATUDA 40 MG rx sent to [facility] instead of being sent to [facility]. She is requesting a 90 day supply. [facility] needs this within 3 days or they will not fill the request. Please call pt once sent to them to let her know.    12/25/17.

## 2017-12-26 MED ORDER — LATUDA 40 MG PO TABS: 40.0000 mg | ORAL_TABLET | Freq: Every day | ORAL | 0 refills | Status: DC

## 2017-12-26 NOTE — Telephone Encounter (Signed)
She was referred to psychiatry in October 2018 She should be getting this medicine from a psychiatrist now I reviewed the referral order and see that she refused the referral She was supposed to see me in January and canceled her appointment  She'll need to be seen please; ask her to schedule an appointment to discuss Let her know that I'll send one last refill of medicine as requested but she'll need to be seen for further refills and will need to see a psychiatrist for her next refill of Latuda; I won't be able to prescribe any more beyond this 90 day supply period; thank you

## 2017-12-26 NOTE — Telephone Encounter (Signed)
Please see below and advise.

## 2017-12-27 NOTE — Telephone Encounter (Signed)
Called pt no answer. LM informing her of information below. Advised pt to call back for questions or concerns. CRM created.

## 2018-01-11 ENCOUNTER — Encounter: Payer: Self-pay | Admitting: Family Medicine

## 2018-01-11 ENCOUNTER — Ambulatory Visit: Payer: Managed Care, Other (non HMO) | Admitting: Family Medicine

## 2018-01-11 VITALS — BP 112/62 | HR 74 | Temp 98.0°F | Resp 14 | Wt 204.5 lb

## 2018-01-11 DIAGNOSIS — Z808 Family history of malignant neoplasm of other organs or systems: Secondary | ICD-10-CM | POA: Diagnosis not present

## 2018-01-11 DIAGNOSIS — Q828 Other specified congenital malformations of skin: Secondary | ICD-10-CM

## 2018-01-11 DIAGNOSIS — Z5181 Encounter for therapeutic drug level monitoring: Secondary | ICD-10-CM | POA: Diagnosis not present

## 2018-01-11 DIAGNOSIS — N289 Disorder of kidney and ureter, unspecified: Secondary | ICD-10-CM | POA: Diagnosis not present

## 2018-01-11 DIAGNOSIS — Z85820 Personal history of malignant melanoma of skin: Secondary | ICD-10-CM

## 2018-01-11 DIAGNOSIS — G4700 Insomnia, unspecified: Secondary | ICD-10-CM | POA: Diagnosis not present

## 2018-01-11 DIAGNOSIS — E6609 Other obesity due to excess calories: Secondary | ICD-10-CM | POA: Diagnosis not present

## 2018-01-11 DIAGNOSIS — Z6830 Body mass index (BMI) 30.0-30.9, adult: Secondary | ICD-10-CM

## 2018-01-11 DIAGNOSIS — Z1239 Encounter for other screening for malignant neoplasm of breast: Secondary | ICD-10-CM

## 2018-01-11 DIAGNOSIS — Z1231 Encounter for screening mammogram for malignant neoplasm of breast: Secondary | ICD-10-CM

## 2018-01-11 DIAGNOSIS — F3175 Bipolar disorder, in partial remission, most recent episode depressed: Secondary | ICD-10-CM

## 2018-01-11 MED ORDER — TRAZODONE HCL 100 MG PO TABS: 100.0000 mg | ORAL_TABLET | Freq: Every day | ORAL | 1 refills | Status: DC

## 2018-01-11 MED ORDER — LATUDA 40 MG PO TABS: 40.0000 mg | ORAL_TABLET | Freq: Every day | ORAL | 1 refills | Status: DC

## 2018-01-11 NOTE — Assessment & Plan Note (Signed)
Avoid NSAIDs; check BMP (she works for [employer])

## 2018-01-11 NOTE — Assessment & Plan Note (Signed)
Last depakote level reviewed

## 2018-01-11 NOTE — Assessment & Plan Note (Signed)
Managed by dermatologist 

## 2018-01-11 NOTE — Patient Instructions (Signed)
Please have the lab done in the next week or two If you have not heard anything from my staff in a week about any orders/referrals/studies from today, please contact us here to follow-up [phone] You should hear from the psychiatrist's office soon

## 2018-01-11 NOTE — Assessment & Plan Note (Signed)
Followed by dermatologist

## 2018-01-11 NOTE — Progress Notes (Signed)
BP 112/62   Pulse 74   Temp 98 F (36.7 C) (Oral)   Resp 14   Wt 204 lb 8 oz (92.8 kg)   SpO2 95%   BMI 30.87 kg/m    Subjective:    Patient ID: Barbara [name], female    DOB: [birth year], 52 y.o.   MRN: [id number]  HPI: Barbara [name] is a 52 y.o. female  Chief Complaint  Patient presents with  . Follow-up  . 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 Evorn Gong is her dermatologist; that was a long time ago; father had it really bad  She has lost weight; considered doing a cauliflower diet  Depression screen [facility] 2/9 01/11/2018 08/16/2017 12/14/2016 09/05/2016 03/06/2016  Decreased Interest 0 0 0 0 0  Down, Depressed, Hopeless 0 0 0 0 0  PHQ - 2 Score 0 0 0 0 0    Relevant past medical, surgical, family and social history reviewed Past Medical History:  Diagnosis Date  . Acid reflux   . Adnexal mass   . Depression   . Depression   . Herpes   . Irregular menstrual bleeding   . Melanoma [facility])    Past Surgical History:  Procedure Laterality Date  . CERVICAL BIOPSY     Dr. Kenton Kingfisher with [facility]  . COLONOSCOPY    . COLONOSCOPY    . DILATION AND CURETTAGE OF UTERUS    . MELANOMA EXCISION    . TONSILLECTOMY     Family History  Problem Relation Age of Onset  . Heart attack Father   . Cancer Father        melanoma  . Cancer Maternal Aunt        breast  . Cancer Paternal Aunt        breast   Social History   Tobacco Use  . Smoking status: Former  Research scientist (life sciences)  . Smokeless tobacco: Never Used  Substance Use Topics  . Alcohol use: No    Alcohol/week: 0.0 oz  . Drug use: No    Interim medical history since last visit reviewed. Allergies and medications reviewed  Review of Systems  Skin: Positive for rash (darier's disease).  Psychiatric/Behavioral: Negative for dysphoric mood.   Per HPI unless specifically indicated above     Objective:    BP 112/62   Pulse 74   Temp 98 F (36.7 C) (Oral)   Resp 14   Wt 204 lb 8 oz (92.8 kg)   SpO2 95%   BMI 30.87 kg/m   Wt Readings from Last 3 Encounters:  01/11/18 204 lb 8 oz (92.8 kg)  09/27/17 207 lb 6.4 oz (94.1 kg)  09/06/17 211 lb (95.7 kg)    Physical Exam  Constitutional: She appears well-developed and well-nourished.  Weight down 6.5 pounds since Oct  HENT:  Mouth/Throat: Mucous membranes are normal.  Eyes: EOM are normal. No scleral icterus.  Cardiovascular: Normal rate and regular rhythm.  Pulmonary/Chest:  Effort normal and breath sounds normal.  Abdominal: She exhibits no distension.  Neurological: She is alert. She displays no tremor.  Psychiatric: She has a normal mood and affect. Her behavior is normal.  Good eye contact    Results for orders placed or performed during the hospital encounter of 70/96/28  Basic metabolic panel  Result Value Ref Range   Sodium 140 135 - 145 mmol/L   Potassium 4.5 3.5 - 5.1 mmol/L   Chloride 103 101 - 111 mmol/L   CO2 27 22 - 32 mmol/L   Glucose, Bld 87 65 - 99 mg/dL   BUN 21 (H) 6 - 20 mg/dL   Creatinine, Ser 1.16 (H) 0.44 - 1.00 mg/dL   Calcium 9.3 8.9 - 10.3 mg/dL   GFR calc non Af Amer 53 (L) >60 mL/min   GFR calc Af Amer >60 >60 mL/min   Anion gap 10 5 - 15  CBC  Result Value Ref Range   WBC 5.6 3.6 - 11.0 K/uL   RBC 4.81 3.80 - 5.20 MIL/uL   Hemoglobin 14.3 12.0 - 16.0 g/dL   HCT 43.0 35.0 - 47.0 %   MCV 89.3 80.0 - 100.0 fL   MCH 29.7 26.0 - 34.0 pg   MCHC 33.2 32.0 - 36.0 g/dL   RDW 14.0 11.5 - 14.5 %    Platelets 152 150 - 440 K/uL  Troponin I  Result Value Ref Range   Troponin I <0.03 <0.03 ng/mL      Assessment & Plan:   Problem List Items Addressed This Visit      Musculoskeletal and Integument   Darier disease    Managed by dermatologist        Genitourinary   Renal insufficiency    Avoid NSAIDs; check BMP (she works for [employer])      Relevant Orders   Basic Metabolic Panel (BMET)     Other   Therapeutic drug monitoring    Last depakote level reviewed      Obesity    Praise and encouragement given      Insomnia    Continue trazodone      Hx of malignant melanoma    Followed by dermatologist      Family hx of melanoma    Followed by dermatologist      Bipolar 1 disorder, depressed, partial remission ([place])    Encouraged patient to see psychiatrist; will refill her meds, but would like her under the care of a psychiatrist given her bipolar 1 diagnosis      Relevant Orders   Ambulatory referral to Psychiatry    Other Visit Diagnoses    Screening for breast cancer    -  Primary   Relevant Orders   MM Digital Screening       Follow up plan: Return in about 6 months (around 07/14/2018).  An after-visit summary was printed and given to the patient at [place].  Please see the patient instructions which may contain other information and recommendations beyond what is mentioned above in the assessment and plan.  Meds ordered this encounter  Medications  . traZODone (DESYREL) 100 MG tablet    Sig: Take 1 tablet (100 mg total) by mouth at bedtime.    Dispense:  90 tablet    Refill:  1  . LATUDA 40 MG TABS tablet    Sig: Take 1 tablet (40 mg total) by mouth daily.    Dispense:  90 tablet    Refill:  1  Orders Placed This Encounter  Procedures  . MM Digital Screening  . Basic Metabolic Panel (BMET)  . Ambulatory referral to Psychiatry

## 2018-01-11 NOTE — Assessment & Plan Note (Signed)
Praise and encouragement given

## 2018-01-11 NOTE — Assessment & Plan Note (Signed)
Encouraged patient to see psychiatrist; will refill her meds, but would like her under the care of a psychiatrist given her bipolar 1 diagnosis

## 2018-01-11 NOTE — Assessment & Plan Note (Signed)
Continue trazodone 

## 2018-01-12 LAB — BASIC METABOLIC PANEL
BUN/Creatinine Ratio: 22 (ref 9–23)
BUN: 21 mg/dL (ref 6–24)
CALCIUM: 9.1 mg/dL (ref 8.7–10.2)
CO2: 23 mmol/L (ref 20–29)
CREATININE: 0.95 mg/dL (ref 0.57–1.00)
Chloride: 103 mmol/L (ref 96–106)
GFR calc Af Amer: 79 mL/min/{1.73_m2} (ref >59)
GFR calc non Af Amer: 69 mL/min/{1.73_m2} (ref >59)
GLUCOSE: 66 mg/dL (ref 65–99)
Potassium: 4.6 mmol/L (ref 3.5–5.2)
SODIUM: 143 mmol/L (ref 134–144)

## 2018-02-12 ENCOUNTER — Ambulatory Visit: Payer: 59 | Admitting: Psychiatry

## 2018-02-12 ENCOUNTER — Other Ambulatory Visit: Payer: Self-pay

## 2018-02-12 ENCOUNTER — Encounter: Payer: Self-pay | Admitting: Psychiatry

## 2018-02-12 VITALS — BP 128/84 | HR 71 | Temp 98.1°F | Wt 202.8 lb

## 2018-02-12 DIAGNOSIS — F5105 Insomnia due to other mental disorder: Secondary | ICD-10-CM | POA: Diagnosis not present

## 2018-02-12 DIAGNOSIS — F3175 Bipolar disorder, in partial remission, most recent episode depressed: Secondary | ICD-10-CM | POA: Diagnosis not present

## 2018-02-12 NOTE — Progress Notes (Signed)
Psychiatric Initial Adult Assessment   Patient Identification: Barbara [name] MRN:  [id number] Date of Evaluation:  02/12/2018 Referral Source: Dr.Melinda Lada Chief Complaint:  ' I want to establish care.' Chief Complaint    Establish Care; Anxiety; Depression     Visit Diagnosis:    ICD-10-CM   1. Bipolar 1 disorder, depressed, partial remission ([place]) F31.75   2. Insomnia due to mental disorder F51.05     History of Present Illness:  Barbara [name] is a 52 year old Caucasian female, lives in [name], employed, divorced, has a history of bipolar disorder currently in remission, presented to the clinic today to establish care.  [name] today reports that she was asked by her primary medical doctor to establish care with a psychiatrist.  Patient used to follow up with Dr.Kaur in the past.  She reports most recently her primary medical doctor was prescribing her medications.  She reports that her PMD felt she needs to reestablish care with a psychiatrist and that is why she is here today.  She reports a history of bipolar disorder.  She reports she was diagnosed with it 22 years ago.  She reports at that time she went through a divorce from her ex-husband.  She was afraid at that time since she suddenly became a single mother of a 78-year-old.  Patient reports she had 2 inpatient mental health admission at [facility] around that time.  Her most recent admission was 18 years ago.  She reports she had some mood lability, anxiety, sadness, euphoria ,sleep problems and so on when she had her episodes.  She currently reports her mood symptoms as stable.  She takes Depakote, Latuda and trazodone.  She reports she has been on that medication for a while.  She reports her PMD prescribes it to her.  She also reports she had Depakote levels done recently.  On review of EHR her labs were drawn in October 2018.  At that time her Depakote level was therapeutic.  Patient reports sleep is fair on  the trazodone.  She denies any significant anxiety symptoms or panic attacks.  She denies any perceptual disturbances.  Patient denies any suicidality or homicidality.  Patient reports she has good social support from her family.  She reports her son who is 6 years old doing very well.  He works with her father on the family owned farm.  He is happily married.  She reports she works at [employer].  She reports work can be stressful some days.  She however denies any significant stressors at this time.  She denies any substance abuse problems.  Associated Signs/Symptoms: Depression Symptoms:  euthymic (Hypo) Manic Symptoms:  mood lability - stable Anxiety Symptoms:  Panic Symptoms, Psychotic Symptoms:  denies PTSD Symptoms: Had a traumatic exposure:  as noted above  Past Psychiatric History: History of bipolar disorder, diagnosed 22 years ago.  Patient had 2 inpatient mental health admission at [facility].  Patient reports her last admission was 18 years ago.  She used to follow up with Dr.Kaur in [place].  Her PMD was prescribing her medications most recently.  She denies any suicide attempts.  Previous Psychotropic Medications: Yes - does not remember name  Substance Abuse History in the last 12 months:  No.  Consequences of Substance Abuse: Negative  Past Medical History:  Past Medical History:  Diagnosis Date  . Acid reflux   . Adnexal mass   . Bipolar disorder ([place])   . Depression   . Depression   .  Herpes   . Irregular menstrual bleeding   . Melanoma [facility])     Past Surgical History:  Procedure Laterality Date  . CERVICAL BIOPSY     Dr. Kenton Kingfisher with [facility]  . COLONOSCOPY    . COLONOSCOPY    . DILATION AND CURETTAGE OF UTERUS    . MELANOMA EXCISION    . TONSILLECTOMY      Family Psychiatric History: Denies any family history of mental health problems.  Her paternal grandfather used to struggle with alcoholism.  Family History:  Family History  Problem  Relation Age of Onset  . Heart attack Father   . Cancer Father        melanoma  . Cancer Maternal Aunt        breast  . Cancer Paternal Aunt        breast    Social History:   Social History   Socioeconomic History  . Marital status: Divorced    Spouse name: Not on file  . Number of children: 1  . Years of education: Not on file  . Highest education level: Associate degree: occupational, Hotel manager, or vocational program  Occupational History    Comment: full time  Social Needs  . Financial resource strain: Not hard at all  . Food insecurity:    Worry: Never true    Inability: Never true  . Transportation needs:    Medical: No    Non-medical: No  Tobacco Use  . Smoking status: Former Smoker    Types: Cigarettes    Last attempt to quit: 02/12/1990    Years since quitting: 28.0  . Smokeless tobacco: Never Used  Substance and Sexual Activity  . Alcohol use: No    Alcohol/week: 0.0 oz  . Drug use: No  . Sexual activity: Not Currently  Lifestyle  . Physical activity:    Days per week: 0 days    Minutes per session: 0 min  . Stress: Rather much  Relationships  . Social connections:    Talks on phone: More than three times a week    Gets together: More than three times a week    Attends religious service: More than 4 times per year    Active member of club or organization: No    Attends meetings of clubs or organizations: Never    Relationship status: Divorced  Other Topics Concern  . Not on file  Social History Narrative  . Not on file    Additional Social History: Patient is divorced.  She works at [employer] in [employer].  She has 1 son who is 79 years old who is doing very well.  Patient lives by self in [place].  Patient reports she has good social support from her family as well as has good friends.  Allergies:   Allergies  Allergen Reactions  . Penicillins Hives and Other (See Comments)    delusional  . Sulfa Antibiotics Hives and Other (See Comments)     dellusional  . Prednisone Other (See Comments)    Heart racing    Metabolic Disorder Labs: No results found for: HGBA1C, MPG No results found for: PROLACTIN Lab Results  Component Value Date   CHOL 210 (H) 10/14/2015   TRIG 144 10/14/2015   HDL 49 10/14/2015   CHOLHDL 4.3 10/14/2015   LDLCALC 132 (H) 10/14/2015     Current Medications: Current Outpatient Medications  Medication Sig Dispense Refill  . divalproex (DEPAKOTE ER) 500 MG 24 hr tablet Take 2 tablets (  1,000 mg total) by mouth daily. 180 tablet 3  . LATUDA 40 MG TABS tablet Take 1 tablet (40 mg total) by mouth daily. 90 tablet 1  . sucralfate (CARAFATE) 1 g tablet Take 1 g by mouth 4 (four) times daily -  with meals and at bedtime.    . traZODone (DESYREL) 100 MG tablet Take 1 tablet (100 mg total) by mouth at bedtime. 90 tablet 1   No current facility-administered medications for this visit.     Neurologic: Headache: No Seizure: No Paresthesias:No  Musculoskeletal: Strength & Muscle Tone: within normal limits Gait & Station: normal Patient leans: N/A  Psychiatric Specialty Exam: Review of Systems  Psychiatric/Behavioral: Positive for depression (stable). The patient has insomnia (stable). The patient is not nervous/anxious.   All other systems reviewed and are negative.   Blood pressure 128/84, pulse 71, temperature 98.1 F (36.7 C), temperature source Oral, weight 202 lb 12.8 oz (92 kg).Body mass index is 30.61 kg/m.  General Appearance: Casual  Eye Contact:  Fair  Speech:  Clear and Coherent  Volume:  Normal  Mood:  Euthymic  Affect:  Congruent  Thought Process:  Goal Directed and Descriptions of Associations: Intact  Orientation:  Full (Time, Place, and Person)  Thought Content:  Logical  Suicidal Thoughts:  No  Homicidal Thoughts:  No  Memory:  Immediate;   Fair Recent;   Fair Remote;   Fair  Judgement:  Fair  Insight:  Fair  Psychomotor Activity:  Normal  Concentration:  Concentration:  Fair and Attention Span: Fair  Recall:  [employer] of Knowledge:Fair  Language: Fair  Akathisia:  No  Handed:  Right  AIMS (if indicated):  0  Assets:  Communication Skills Desire for Improvement Financial Resources/Insurance [place] Talents/Skills Transportation Vocational/Educational  ADL's:  Intact  Cognition: WNL  Sleep:  Stable on trazodone    Treatment Plan Summary:Tosca is a 52 year old Caucasian female, employed, divorced, lives in [place], has a history of bipolar disorder, currently in partial remission, presented to the clinic today to establish care.  Patient's medications were being prescribed by her PMD most recently.  She was advised by her PMD to reestablish care with a psychiatrist.  Patient reports mood symptoms are stable on the current medication regimen.  Patient denies any recent stressors.  Patient has good social support system.  She patient is employed.  Patient denies any suicidality.  Discussed with patient that we can continue her current dosage of medication at this time.  We will continue to monitor.  Plan as noted below Medication management and Plan as noted below  Plan Bipolar disorder, in partial remission Continue Depakote ER 1000 mg at bedtime Continue Latuda 40 mg daily with meals. Aims equals 0  For insomnia Continue trazodone 100 mg p.o. nightly  Reviewed Depakote level in EHR done on 08/17/2017-63 -therapeutic Reviewed hepatic function panel-08/17/2017-within normal limits Reviewed TSH-08/17/2017-wnl  Discussed with patient that we can monitor her symptoms closely.  Will get labs when she comes back.  I have reviewed medical records in [facility] Per PMD.  Follow-up in clinic in 3 months or sooner if needed.  More than 50 % of the time was spent for psychoeducation and supportive psychotherapy and care coordination.  This note was generated in part or whole with voice recognition software. Voice  recognition is usually quite accurate but there are transcription errors that can and very often do occur. I apologize for any typographical errors that were  not detected and corrected.     Ursula Alert, MD 4/2/20193:39 PM

## 2018-02-12 NOTE — Patient Instructions (Signed)
Trazodone tablets What is this medicine? TRAZODONE (TRAZ oh done) is used to treat depression. This medicine may be used for other purposes; ask your health care provider or pharmacist if you have questions. COMMON BRAND NAME(S): Desyrel What should I tell my health care provider before I take this medicine? They need to know if you have any of these conditions: -attempted suicide or thinking about it -bipolar disorder -bleeding problems -glaucoma -heart disease, or previous heart attack -irregular heart beat -kidney or liver disease -low levels of sodium in the blood -an unusual or allergic reaction to trazodone, other medicines, foods, dyes or preservatives -pregnant or trying to get pregnant -breast-feeding How should I use this medicine? Take this medicine by mouth with a glass of water. Follow the directions on the prescription label. Take this medicine shortly after a meal or a light snack. Take your medicine at regular intervals. Do not take your medicine more often than directed. Do not stop taking this medicine suddenly except upon the advice of your doctor. Stopping this medicine too quickly may cause serious side effects or your condition may worsen. A special MedGuide will be given to you by the pharmacist with each prescription and refill. Be sure to read this information carefully each time. Talk to your pediatrician regarding the use of this medicine in children. Special care may be needed. Overdosage: If you think you have taken too much of this medicine contact a poison control center or emergency room at once. NOTE: This medicine is only for you. Do not share this medicine with others. What if I miss a dose? If you miss a dose, take it as soon as you can. If it is almost time for your next dose, take only that dose. Do not take double or extra doses. What may interact with this medicine? Do not take this medicine with any of the following medications: -certain medicines  for fungal infections like fluconazole, itraconazole, ketoconazole, posaconazole, voriconazole -cisapride -dofetilide -dronedarone -linezolid -MAOIs like Carbex, Eldepryl, Marplan, Nardil, and Parnate -mesoridazine -methylene blue (injected into a vein) -pimozide -saquinavir -thioridazine -ziprasidone This medicine may also interact with the following medications: -alcohol -antiviral medicines for HIV or AIDS -aspirin and aspirin-like medicines -barbiturates like phenobarbital -certain medicines for blood pressure, heart disease, irregular heart beat -certain medicines for depression, anxiety, or psychotic disturbances -certain medicines for migraine headache like almotriptan, eletriptan, frovatriptan, naratriptan, rizatriptan, sumatriptan, zolmitriptan -certain medicines for seizures like carbamazepine and phenytoin -certain medicines for sleep -certain medicines that treat or prevent blood clots like dalteparin, enoxaparin, warfarin -digoxin -fentanyl -lithium -NSAIDS, medicines for pain and inflammation, like ibuprofen or naproxen -other medicines that prolong the QT interval (cause an abnormal heart rhythm) -rasagiline -supplements like St. John's wort, kava kava, valerian -tramadol -tryptophan This list may not describe all possible interactions. Give your health care provider a list of all the medicines, herbs, non-prescription drugs, or dietary supplements you use. Also tell them if you smoke, drink alcohol, or use illegal drugs. Some items may interact with your medicine. What should I watch for while using this medicine? Tell your doctor if your symptoms do not get better or if they get worse. Visit your doctor or health care professional for regular checks on your progress. Because it may take several weeks to see the full effects of this medicine, it is important to continue your treatment as prescribed by your doctor. Patients and their families should watch out for new  or worsening thoughts of suicide or depression. Also   watch out for sudden changes in feelings such as feeling anxious, agitated, panicky, irritable, hostile, aggressive, impulsive, severely restless, overly excited and hyperactive, or not being able to sleep. If this happens, especially at the beginning of treatment or after a change in dose, call your health care professional. Dennis Bast may get drowsy or dizzy. Do not drive, use machinery, or do anything that needs mental alertness until you know how this medicine affects you. Do not stand or sit up quickly, especially if you are an older patient. This reduces the risk of dizzy or fainting spells. Alcohol may interfere with the effect of this medicine. Avoid alcoholic drinks. This medicine may cause dry eyes and blurred vision. If you wear contact lenses you may feel some discomfort. Lubricating drops may help. See your eye doctor if the problem does not go away or is severe. Your mouth may get dry. Chewing sugarless gum, sucking hard candy and drinking plenty of water may help. Contact your doctor if the problem does not go away or is severe. What side effects may I notice from receiving this medicine? Side effects that you should report to your doctor or health care professional as soon as possible: -allergic reactions like skin rash, itching or hives, swelling of the face, lips, or tongue -elevated mood, decreased need for sleep, racing thoughts, impulsive behavior -confusion -fast, irregular heartbeat -feeling faint or lightheaded, falls -feeling agitated, angry, or irritable -loss of balance or coordination -painful or prolonged erections -restlessness, pacing, inability to keep still -suicidal thoughts or other mood changes -tremors -trouble sleeping -seizures -unusual bleeding or bruising Side effects that usually do not require medical attention (report to your doctor or health care professional if they continue or are bothersome): -change in  sex drive or performance -change in appetite or weight -constipation -headache -muscle aches or pains -nausea This list may not describe all possible side effects. Call your doctor for medical advice about side effects. You may report side effects to FDA at 1-800-FDA-1088. Where should I keep my medicine? Keep out of the reach of children. Store at room temperature between 15 and 30 degrees C (59 to 86 degrees F). Protect from light. Keep container tightly closed. Throw away any unused medicine after the expiration date. NOTE: This sheet is a summary. It may not cover all possible information. If you have questions about this medicine, talk to your doctor, pharmacist, or health care provider.  2018 [employer]/Gold Standard (2016-03-30 16:57:05) Valproic Acid, Divalproex Sodium delayed or extended-release tablets What is this medicine? DIVALPROEX SODIUM (dye VAL pro ex SO dee um) is used to prevent seizures caused by some forms of epilepsy. It is also used to treat bipolar mania and to prevent migraine headaches. This medicine may be used for other purposes; ask your health care provider or pharmacist if you have questions. COMMON BRAND NAME(S): Depakote, Depakote ER What should I tell my health care provider before I take this medicine? They need to know if you have any of these conditions: -if you often drink alcohol -kidney disease -liver disease -low platelet counts -mitochondrial disease -suicidal thoughts, plans, or attempt; a previous suicide attempt by you or a family member -urea cycle disorder (UCD) -an unusual or allergic reaction to divalproex sodium, sodium valproate, valproic acid, other medicines, foods, dyes, or preservatives -pregnant or trying to get pregnant -breast-feeding How should I use this medicine? Take this medicine by mouth with a drink of water. Follow the directions on the prescription label. Do not cut, crush  or chew this medicine. You can take it with or  without food. If it upsets your stomach, take it with food. Take your medicine at regular intervals. Do not take it more often than directed. Do not stop taking except on your doctor's advice. A special MedGuide will be given to you by the pharmacist with each prescription and refill. Be sure to read this information carefully each time. Talk to your pediatrician regarding the use of this medicine in children. While this drug may be prescribed for children as young as 10 years for selected conditions, precautions do apply. Overdosage: If you think you have taken too much of this medicine contact a poison control center or emergency room at once. NOTE: This medicine is only for you. Do not share this medicine with others. What if I miss a dose? If you miss a dose, take it as soon as you can. If it is almost time for your next dose, take only that dose. Do not take double or extra doses. What may interact with this medicine? Do not take this medicine with any of the following medications: -sodium phenylbutyrate This medicine may also interact with the following medications: -aspirin -certain antibiotics like ertapenem, imipenem, meropenem -certain medicines for depression, anxiety, or psychotic disturbances -certain medicines for seizures like carbamazepine, clonazepam, diazepam, ethosuximide, felbamate, lamotrigine, phenobarbital, phenytoin, primidone, rufinamide, topiramate -certain medicines that treat or prevent blood clots like warfarin -cholestyramine -female hormones, like estrogens and birth control pills, patches, or rings -propofol -rifampin -ritonavir -tolbutamide -zidovudine This list may not describe all possible interactions. Give your health care provider a list of all the medicines, herbs, non-prescription drugs, or dietary supplements you use. Also tell them if you smoke, drink alcohol, or use illegal drugs. Some items may interact with your medicine. What should I watch for  while using this medicine? Tell your doctor or healthcare professional if your symptoms do not get better or they start to get worse. Wear a medical ID bracelet or chain, and carry a card that describes your disease and details of your medicine and dosage times. You may get drowsy, dizzy, or have blurred vision. Do not drive, use machinery, or do anything that needs mental alertness until you know how this medicine affects you. To reduce dizzy or fainting spells, do not sit or stand up quickly, especially if you are an older patient. Alcohol can increase drowsiness and dizziness. Avoid alcoholic drinks. This medicine can make you more sensitive to the sun. Keep out of the sun. If you cannot avoid being in the sun, wear protective clothing and use sunscreen. Do not use sun lamps or tanning beds/booths. Patients and their families should watch out for new or worsening depression or thoughts of suicide. Also watch out for sudden changes in feelings such as feeling anxious, agitated, panicky, irritable, hostile, aggressive, impulsive, severely restless, overly excited and hyperactive, or not being able to sleep. If this happens, especially at the beginning of treatment or after a change in dose, call your health care professional. Women should inform their doctor if they wish to become pregnant or think they might be pregnant. There is a potential for serious side effects to an unborn child. Talk to your health care professional or pharmacist for more information. Women who become pregnant while using this medicine may enroll in the [place] Pregnancy Registry by calling [phone]. This registry collects information about the safety of antiepileptic drug use during pregnancy. What side effects may I notice  from receiving this medicine? Side effects that you should report to your doctor or health care professional as soon as possible: -allergic reactions like skin rash, itching or  hives, swelling of the face, lips, or tongue -changes in vision -redness, blistering, peeling or loosening of the skin, including inside the mouth -signs and symptoms of liver injury like dark yellow or brown urine; general ill feeling or flu-like symptoms; light-colored stools; loss of appetite; nausea; right upper belly pain; unusually weak or tired; yellowing of the eyes or skin -suicidal thoughts or other mood changes -unusual bleeding or bruising Side effects that usually do not require medical attention (report to your doctor or health care professional if they continue or are bothersome): -constipation -diarrhea -dizziness -hair loss -headache -loss of appetite -weight gain This list may not describe all possible side effects. Call your doctor for medical advice about side effects. You may report side effects to FDA at 1-800-FDA-1088. Where should I keep my medicine? Keep out of reach of children. Store at room temperature between 15 and 30 degrees C (59 and 86 degrees F). Keep container tightly closed. Throw away any unused medicine after the expiration date. NOTE: This sheet is a summary. It may not cover all possible information. If you have questions about this medicine, talk to your doctor, pharmacist, or health care provider.  2018 [employer]/Gold Standard (2016-02-03 07:11:40) Lurasidone oral tablet What is this medicine? LURASIDONE (loo RAS i done) is an antipsychotic. It is used to treat schizophrenia or bipolar disorder, also known as manic-depression. This medicine may be used for other purposes; ask your health care provider or pharmacist if you have questions. COMMON BRAND NAME(S): Latuda What should I tell my health care provider before I take this medicine? They need to know if you have any of these conditions: -dementia -diabetes -difficulty swallowing -heart disease -history of breast cancer -kidney disease -liver disease -low blood counts, like low white  cell, platelet, or red cell counts -low blood pressure -Parkinson's disease -seizures -suicidal thoughts, plans, or attempt; a previous suicide attempt by you or a family member -an unusual or allergic reaction to lurasidone, other medicines, foods, dyes, or preservatives -pregnant or trying to get pregnant -breast-feeding How should I use this medicine? Take this medicine by mouth with a glass of water. Follow the directions on the prescription label. Take this medicine with food. Take your medicine at regular intervals. Do not take it more often than directed. Do not stop taking except on your doctor's advice. Talk to your pediatrician regarding the use of this medicine in children. While this drug may be prescribed for children as young as 13 years for selected conditions, precautions do apply. Overdosage: If you think you have taken too much of this medicine contact a poison control center or emergency room at once. NOTE: This medicine is only for you. Do not share this medicine with others. What if I miss a dose? If you miss a dose, take it as soon as you can. If it is almost time for your next dose, take only that dose. Do not take double or extra doses. What may interact with this medicine? Do not take this medicine with any of the following medications: -dalfopristin; quinupristin -delavirdine -indinavir -isoniazid -itraconazole -ketoconazole -lumacaftor; ivacaftor -metoclopramide -rifampin -ritonavir -tipranavir This medicine may also interact with the following medications: -alcohol -certain medicines for anxiety or sleep -diltiazem -digoxin -lithium -medicines for blood pressure This list may not describe all possible interactions. Give your health care  provider a list of all the medicines, herbs, non-prescription drugs, or dietary supplements you use. Also tell them if you smoke, drink alcohol, or use illegal drugs. Some items may interact with your medicine. What  should I watch for while using this medicine? Visit your doctor or health care professional for regular checks on your progress. It may be several weeks before you see the full effects of this medicine. Notify your doctor or health care professional if your symptoms get worse, if you have new symptoms, if you are having an unusual effect from this medicine, or if you feel out of control, very discouraged or think you might harm yourself or others. Do not suddenly stop taking this medicine. You may need to gradually reduce the dose. Ask your doctor or health care professional for advice. You may get dizzy or drowsy. Do not drive, use machinery, or do anything that needs mental alertness until you know how this medicine affects you. Do not stand or sit up quickly, especially if you are an older patient. This reduces the risk of dizzy or fainting spells. Alcohol can increase dizziness and drowsiness. Avoid alcoholic drinks. This medicine may cause dry eyes and blurred vision. If you wear contact lenses you may feel some discomfort. Lubricating drops may help. See your eye doctor if the problem does not go away or is severe. This medicine can reduce the response of your body to heat or cold. Dress warm in cold weather and stay hydrated in hot weather. If possible, avoid extreme temperatures like saunas, hot tubs, very hot or cold showers, or activities that can cause dehydration such as vigorous exercise. What side effects may I notice from receiving this medicine? Side effects that you should report to your doctor or health care professional as soon as possible: -allergic reactions like skin rash, itching or hives, swelling of the face, lips, or tongue -confusion -feeling faint or lightheaded, falls -fever or chills, sore throat -high fever -inability to control muscle movements in the face, mouth, hands, arms, or legs -increased hunger or thirst -increased urination -missed menstrual  periods -restlessness or need to keep moving -seizures -stiffness, spasms, trembling -suicidal thoughts or other mood changes -unusually weak or tired Side effects that usually do not require medical attention (report to your doctor or health care professional if they continue or are bothersome): -blurred vision -change in sex drive or performance -diarrhea -drowsiness -nausea, vomiting -weight gain This list may not describe all possible side effects. Call your doctor for medical advice about side effects. You may report side effects to FDA at 1-800-FDA-1088. Where should I keep my medicine? Keep out of the reach of children. Store at room temperature between 15 and 30 degrees C (59 and 86 degrees F). Throw away any unused medicine after the expiration date. NOTE: This sheet is a summary. It may not cover all possible information. If you have questions about this medicine, talk to your doctor, pharmacist, or health care provider.  2018 [employer]/Gold Standard (2015-12-15 10:23:22)

## 2018-02-19 ENCOUNTER — Ambulatory Visit
Admission: RE | Admit: 2018-02-19 | Discharge: 2018-02-19 | Disposition: A | Discharge status: Home / Self Care | Payer: Managed Care, Other (non HMO) | Source: Ambulatory Visit | Attending: Family Medicine | Admitting: Family Medicine

## 2018-02-19 DIAGNOSIS — Z1231 Encounter for screening mammogram for malignant neoplasm of breast: Secondary | ICD-10-CM | POA: Diagnosis not present

## 2018-02-19 DIAGNOSIS — Z1239 Encounter for other screening for malignant neoplasm of breast: Secondary | ICD-10-CM

## 2018-02-26 ENCOUNTER — Inpatient Hospital Stay
Admission: RE | Admit: 2018-02-26 | Discharge: 2018-02-26 | Disposition: A | Discharge status: Home / Self Care | Payer: Self-pay | Source: Ambulatory Visit | Attending: *Deleted | Admitting: *Deleted

## 2018-02-26 ENCOUNTER — Other Ambulatory Visit: Payer: Self-pay | Admitting: *Deleted

## 2018-02-26 DIAGNOSIS — Z9289 Personal history of other medical treatment: Secondary | ICD-10-CM

## 2018-05-13 ENCOUNTER — Encounter: Payer: Self-pay | Admitting: Psychiatry

## 2018-05-13 ENCOUNTER — Other Ambulatory Visit: Payer: Self-pay

## 2018-05-13 ENCOUNTER — Ambulatory Visit: Payer: 59 | Admitting: Psychiatry

## 2018-05-13 VITALS — BP 115/73 | HR 75 | Temp 98.4°F | Wt 200.4 lb

## 2018-05-13 DIAGNOSIS — F5105 Insomnia due to other mental disorder: Secondary | ICD-10-CM

## 2018-05-13 DIAGNOSIS — F319 Bipolar disorder, unspecified: Secondary | ICD-10-CM

## 2018-05-13 MED ORDER — LURASIDONE HCL 60 MG PO TABS: 60.0000 mg | ORAL_TABLET | Freq: Every day | ORAL | 1 refills | Status: DC

## 2018-05-13 MED ORDER — TRAZODONE HCL 100 MG PO TABS: 150.0000 mg | ORAL_TABLET | Freq: Every day | ORAL | 1 refills | Status: DC

## 2018-05-13 MED ORDER — HYDROXYZINE HCL 25 MG PO TABS: 25.0000 mg | ORAL_TABLET | Freq: Every evening | ORAL | 1 refills | Status: DC | PRN

## 2018-05-13 MED ORDER — DIVALPROEX SODIUM ER 500 MG PO TB24: 1000.0000 mg | ORAL_TABLET | Freq: Every day | ORAL | 1 refills | Status: DC

## 2018-05-13 NOTE — Patient Instructions (Signed)
Hydroxyzine capsules or tablets What is this medicine? HYDROXYZINE (hye [place] i zeen) is an antihistamine. This medicine is used to treat allergy symptoms. It is also used to treat anxiety and tension. This medicine can be used with other medicines to induce sleep before surgery. This medicine may be used for other purposes; ask your health care provider or pharmacist if you have questions. COMMON BRAND NAME(S): ANX, Atarax, Rezine, Vistaril What should I tell my health care provider before I take this medicine? They need to know if you have any of these conditions: -any chronic illness -difficulty passing urine -glaucoma -heart disease -kidney disease -liver disease -lung disease -an unusual or allergic reaction to hydroxyzine, cetirizine, other medicines, foods, dyes, or preservatives -pregnant or trying to get pregnant -breast-feeding How should I use this medicine? Take this medicine by mouth with a full glass of water. Follow the directions on the prescription label. You may take this medicine with food or on an empty stomach. Take your medicine at regular intervals. Do not take your medicine more often than directed. Talk to your pediatrician regarding the use of this medicine in children. Special care may be needed. While this drug may be prescribed for children as young as 75 years of age for selected conditions, precautions do apply. Patients over 62 years old may have a stronger reaction and need a smaller dose. Overdosage: If you think you have taken too much of this medicine contact a poison control center or emergency room at once. NOTE: This medicine is only for you. Do not share this medicine with others. What if I miss a dose? If you miss a dose, take it as soon as you can. If it is almost time for your next dose, take only that dose. Do not take double or extra doses. What may interact with this medicine? -alcohol -barbiturate medicines for sleep or seizures -medicines for  colds, allergies -medicines for depression, anxiety, or emotional disturbances -medicines for pain -medicines for sleep -muscle relaxants This list may not describe all possible interactions. Give your health care provider a list of all the medicines, herbs, non-prescription drugs, or dietary supplements you use. Also tell them if you smoke, drink alcohol, or use illegal drugs. Some items may interact with your medicine. What should I watch for while using this medicine? Tell your doctor or health care professional if your symptoms do not improve. You may get drowsy or dizzy. Do not drive, use machinery, or do anything that needs mental alertness until you know how this medicine affects you. Do not stand or sit up quickly, especially if you are an older patient. This reduces the risk of dizzy or fainting spells. Alcohol may interfere with the effect of this medicine. Avoid alcoholic drinks. Your mouth may get dry. Chewing sugarless gum or sucking hard candy, and drinking plenty of water may help. Contact your doctor if the problem does not go away or is severe. This medicine may cause dry eyes and blurred vision. If you wear contact lenses you may feel some discomfort. Lubricating drops may help. See your eye doctor if the problem does not go away or is severe. If you are receiving skin tests for allergies, tell your doctor you are using this medicine. What side effects may I notice from receiving this medicine? Side effects that you should report to your doctor or health care professional as soon as possible: -fast or irregular heartbeat -difficulty passing urine -seizures -slurred speech or confusion -tremor Side effects that  usually do not require medical attention (report to your doctor or health care professional if they continue or are bothersome): -constipation -drowsiness -fatigue -headache -stomach upset This list may not describe all possible side effects. Call your doctor for  medical advice about side effects. You may report side effects to FDA at 1-800-FDA-1088. Where should I keep my medicine? Keep out of the reach of children. Store at room temperature between 15 and 30 degrees C (59 and 86 degrees F). Keep container tightly closed. Throw away any unused medicine after the expiration date. NOTE: This sheet is a summary. It may not cover all possible information. If you have questions about this medicine, talk to your doctor, pharmacist, or health care provider.  2018 Elsevier/Gold Standard (2008-03-13 14:50:59) Trazodone tablets What is this medicine? TRAZODONE (TRAZ oh done) is used to treat depression. This medicine may be used for other purposes; ask your health care provider or pharmacist if you have questions. COMMON BRAND NAME(S): Desyrel What should I tell my health care provider before I take this medicine? They need to know if you have any of these conditions: -attempted suicide or thinking about it -bipolar disorder -bleeding problems -glaucoma -heart disease, or previous heart attack -irregular heart beat -kidney or liver disease -low levels of sodium in the blood -an unusual or allergic reaction to trazodone, other medicines, foods, dyes or preservatives -pregnant or trying to get pregnant -breast-feeding How should I use this medicine? Take this medicine by mouth with a glass of water. Follow the directions on the prescription label. Take this medicine shortly after a meal or a light snack. Take your medicine at regular intervals. Do not take your medicine more often than directed. Do not stop taking this medicine suddenly except upon the advice of your doctor. Stopping this medicine too quickly may cause serious side effects or your condition may worsen. A special MedGuide will be given to you by the pharmacist with each prescription and refill. Be sure to read this information carefully each time. Talk to your pediatrician regarding the use  of this medicine in children. Special care may be needed. Overdosage: If you think you have taken too much of this medicine contact a poison control center or emergency room at once. NOTE: This medicine is only for you. Do not share this medicine with others. What if I miss a dose? If you miss a dose, take it as soon as you can. If it is almost time for your next dose, take only that dose. Do not take double or extra doses. What may interact with this medicine? Do not take this medicine with any of the following medications: -certain medicines for fungal infections like fluconazole, itraconazole, ketoconazole, posaconazole, voriconazole -cisapride -dofetilide -dronedarone -linezolid -MAOIs like Carbex, Eldepryl, Marplan, Nardil, and Parnate -mesoridazine -methylene blue (injected into a vein) -pimozide -saquinavir -thioridazine -ziprasidone This medicine may also interact with the following medications: -alcohol -antiviral medicines for HIV or AIDS -aspirin and aspirin-like medicines -barbiturates like phenobarbital -certain medicines for blood pressure, heart disease, irregular heart beat -certain medicines for depression, anxiety, or psychotic disturbances -certain medicines for migraine headache like almotriptan, eletriptan, frovatriptan, naratriptan, rizatriptan, sumatriptan, zolmitriptan -certain medicines for seizures like carbamazepine and phenytoin -certain medicines for sleep -certain medicines that treat or prevent blood clots like dalteparin, enoxaparin, warfarin -digoxin -fentanyl -lithium -NSAIDS, medicines for pain and inflammation, like ibuprofen or naproxen -other medicines that prolong the QT interval (cause an abnormal heart rhythm) -rasagiline -supplements like St. John's wort, kava kava,  valerian -tramadol -tryptophan This list may not describe all possible interactions. Give your health care provider a list of all the medicines, herbs, non-prescription  drugs, or dietary supplements you use. Also tell them if you smoke, drink alcohol, or use illegal drugs. Some items may interact with your medicine. What should I watch for while using this medicine? Tell your doctor if your symptoms do not get better or if they get worse. Visit your doctor or health care professional for regular checks on your progress. Because it may take several weeks to see the full effects of this medicine, it is important to continue your treatment as prescribed by your doctor. Patients and their families should watch out for new or worsening thoughts of suicide or depression. Also watch out for sudden changes in feelings such as feeling anxious, agitated, panicky, irritable, hostile, aggressive, impulsive, severely restless, overly excited and hyperactive, or not being able to sleep. If this happens, especially at the beginning of treatment or after a change in dose, call your health care professional. Dennis Bast may get drowsy or dizzy. Do not drive, use machinery, or do anything that needs mental alertness until you know how this medicine affects you. Do not stand or sit up quickly, especially if you are an older patient. This reduces the risk of dizzy or fainting spells. Alcohol may interfere with the effect of this medicine. Avoid alcoholic drinks. This medicine may cause dry eyes and blurred vision. If you wear contact lenses you may feel some discomfort. Lubricating drops may help. See your eye doctor if the problem does not go away or is severe. Your mouth may get dry. Chewing sugarless gum, sucking hard candy and drinking plenty of water may help. Contact your doctor if the problem does not go away or is severe. What side effects may I notice from receiving this medicine? Side effects that you should report to your doctor or health care professional as soon as possible: -allergic reactions like skin rash, itching or hives, swelling of the face, lips, or tongue -elevated mood,  decreased need for sleep, racing thoughts, impulsive behavior -confusion -fast, irregular heartbeat -feeling faint or lightheaded, falls -feeling agitated, angry, or irritable -loss of balance or coordination -painful or prolonged erections -restlessness, pacing, inability to keep still -suicidal thoughts or other mood changes -tremors -trouble sleeping -seizures -unusual bleeding or bruising Side effects that usually do not require medical attention (report to your doctor or health care professional if they continue or are bothersome): -change in sex drive or performance -change in appetite or weight -constipation -headache -muscle aches or pains -nausea This list may not describe all possible side effects. Call your doctor for medical advice about side effects. You may report side effects to FDA at 1-800-FDA-1088. Where should I keep my medicine? Keep out of the reach of children. Store at room temperature between 15 and 30 degrees C (59 to 86 degrees F). Protect from light. Keep container tightly closed. Throw away any unused medicine after the expiration date. NOTE: This sheet is a summary. It may not cover all possible information. If you have questions about this medicine, talk to your doctor, pharmacist, or health care provider.  2018 Elsevier/Gold Standard (2016-03-30 16:57:05)

## 2018-05-13 NOTE — Progress Notes (Signed)
[name] OP Progress Note  05/13/2018 3:20 PM Barbara [name]  MRN:  [id number]  Chief Complaint: ' I am here for follow up.' Chief Complaint    Follow-up; Medication Refill     HPI: Barbara [name] is a 52 year old Caucasian female, lives in [place], employed, divorced, has a history of bipolar disorder, currently in remission, presented to the clinic today for a follow-up visit.  Patient today reports she has been compliant with her medications, Depakote and Latuda.  She however reports some recent racing thoughts especially towards bedtime.  She reports that has been affecting her sleep.  She reports she takes trazodone which she used to work in the past but most recently her sleep has been restless.  She reports she gets up to 4 hours of sleep and then she is awake for 2 hours or so and by the time she falls back asleep it is time for her to wake up.  Patient is interested in readjusting her trazodone dosage today.  Patient denies any significant psychosocial stressors.  She reports work is going well.  She reports she looks forward to a vacation she is going to take in September.  Patient denies any suicidality.  Patient denies any perceptual disturbances.  Patient denies any side effects to medications.  Discussed getting her Depakote level and metabolic panel done.  Will give her lab slip today.   Visit Diagnosis:    ICD-10-CM   1. Bipolar I disorder, most recent episode depressed with anxious distress ([place]) F31.9   2. Insomnia due to mental disorder F51.05     Past Psychiatric History: I have reviewed past psychiatric history from my progress note on 02/12/2018.  Past Medical History:  Past Medical History:  Diagnosis Date  . Acid reflux   . Adnexal mass   . Bipolar disorder ([place])   . Depression   . Depression   . Herpes   . Irregular menstrual bleeding   . Melanoma [facility])     Past Surgical History:  Procedure Laterality Date  . CERVICAL BIOPSY     Dr. Kenton Kingfisher with [facility]  OB/GYN  . COLONOSCOPY    . COLONOSCOPY    . DILATION AND CURETTAGE OF UTERUS    . MELANOMA EXCISION    . TONSILLECTOMY      Family Psychiatric History: Reviewed family psychiatric history from my progress note on 02/12/2018  Family History:  Family History  Problem Relation Age of Onset  . Heart attack Father   . Cancer Father        melanoma  . Cancer Maternal Aunt        breast  . Cancer Paternal Aunt        breast    Social History: Reviewed social history from my progress note on 02/12/2018. Social History   Socioeconomic History  . Marital status: Divorced    Spouse name: Not on file  . Number of children: 1  . Years of education: Not on file  . Highest education level: Associate degree: occupational, Hotel manager, or vocational program  Occupational History    Comment: full time  Social Needs  . Financial resource strain: Not hard at all  . Food insecurity:    Worry: Never true    Inability: Never true  . Transportation needs:    Medical: No    Non-medical: No  Tobacco Use  . Smoking status: Former Smoker    Types: Cigarettes    Last attempt to quit: 02/12/1990    Years  since quitting: 28.2  . Smokeless tobacco: Never Used  Substance and Sexual Activity  . Alcohol use: No    Alcohol/week: 0.0 oz  . Drug use: No  . Sexual activity: Not Currently  Lifestyle  . Physical activity:    Days per week: 0 days    Minutes per session: 0 min  . Stress: Rather much  Relationships  . Social connections:    Talks on phone: More than three times a week    Gets together: More than three times a week    Attends religious service: More than 4 times per year    Active member of club or organization: No    Attends meetings of clubs or organizations: Never    Relationship status: Divorced  Other Topics Concern  . Not on file  Social History Narrative  . Not on file    Allergies:  Allergies  Allergen Reactions  . Penicillins Hives and Other (See Comments)     delusional  . Sulfa Antibiotics Hives and Other (See Comments)    dellusional  . Prednisone Other (See Comments)    Heart racing    Metabolic Disorder Labs: No results found for: HGBA1C, MPG No results found for: PROLACTIN Lab Results  Component Value Date   CHOL 210 (H) 10/14/2015   TRIG 144 10/14/2015   HDL 49 10/14/2015   CHOLHDL 4.3 10/14/2015   LDLCALC 132 (H) 10/14/2015   Lab Results  Component Value Date   TSH 4.030 08/17/2017   TSH 2.770 10/14/2015    Therapeutic Level Labs: No results found for: LITHIUM Lab Results  Component Value Date   VALPROATE 63 08/17/2017   VALPROATE 85 09/07/2016   No components found for:  CBMZ  Current Medications: Current Outpatient Medications  Medication Sig Dispense Refill  . divalproex (DEPAKOTE ER) 500 MG 24 hr tablet Take 2 tablets (1,000 mg total) by mouth daily. 180 tablet 1  . traZODone (DESYREL) 100 MG tablet Take 1.5 tablets (150 mg total) by mouth at bedtime. 135 tablet 1  . hydrOXYzine (ATARAX/VISTARIL) 25 MG tablet Take 1 tablet (25 mg total) by mouth at bedtime as needed (sleep). 90 tablet 1  . Lurasidone HCl 60 MG TABS Take 1 tablet (60 mg total) by mouth daily with supper. 90 tablet 1   No current facility-administered medications for this visit.      Musculoskeletal: Strength & Muscle Tone: within normal limits Gait & Station: normal Patient leans: N/A  Psychiatric Specialty Exam: Review of Systems  Psychiatric/Behavioral: The patient is nervous/anxious and has insomnia.   All other systems reviewed and are negative.   Blood pressure 115/73, pulse 75, temperature 98.4 F (36.9 C), temperature source Oral, weight 200 lb 6.4 oz (90.9 kg).Body mass index is 30.25 kg/m.  General Appearance: Casual  Eye Contact:  Fair  Speech:  Normal Rate  Volume:  Normal  Mood:  Anxious  Affect:  Congruent  Thought Process:  Goal Directed and Descriptions of Associations: Intact  Orientation:  Full (Time, Place, and  Person)  Thought Content: Logical   Suicidal Thoughts:  No  Homicidal Thoughts:  No  Memory:  Immediate;   Fair Recent;   Fair Remote;   Fair  Judgement:  Fair  Insight:  Fair  Psychomotor Activity:  Normal  Concentration:  Concentration: Fair and Attention Span: Fair  Recall:  [employer] of Knowledge: Fair  Language: Fair  Akathisia:  No  Handed:  Right  AIMS (if indicated): denies tremors, rigidity,s  tiffness  Assets:  Communication Skills Desire for Improvement Social Support  ADL's:  Intact  Cognition: WNL  Sleep:  restless   Screenings: AIMS     Office Visit from 02/12/2018 in [facility] Total Score  0    PHQ2-9     Office Visit from 01/11/2018 in [facility] Office Visit from 08/16/2017 in [facility] Office Visit from 12/14/2016 in [facility] Office Visit from 09/05/2016 in [facility] Office Visit from 03/06/2016 in [facility]  PHQ-2 Total Score  0  0  0  0  0       Assessment and Plan: Arionna is a 52 year old Caucasian female, employed, divorced, lives in [place], has a history of bipolar disorder, presented to the clinic today for a follow-up visit.  Patient today reports some racing thoughts as well as sleep issues.  She continues to be employed.  She denies any psychosocial stressors.  Discussed plan as noted below.  Plan Bipolar disorder type I Continue Depakote ER 1000 mg at bedtime. Increase Latuda to 60 mg daily with meals.  For insomnia Increase trazodone to 150 mg p.o. Nightly.  We will get the following labs-Depakote level, hepatic function, hemoglobin A1c, lipid panel prolactin, vitamin D .  Follow-up in clinic in 2 months.  More than 50 % of the time was spent for psychoeducation and supportive psychotherapy and care coordination. This note was generated in part or whole with voice recognition software. Voice  recognition is usually quite accurate but there are transcription errors that can and very often do occur. I apologize for any typographical errors that were not detected and corrected.        Ursula Alert, MD 05/13/2018, 3:20 PM

## 2018-05-17 ENCOUNTER — Other Ambulatory Visit: Payer: Self-pay | Admitting: Psychiatry

## 2018-05-18 LAB — HEPATIC FUNCTION PANEL
ALT: 13 IU/L (ref 0–32)
AST: 20 IU/L (ref 0–40)
Albumin: 4.6 g/dL (ref 3.5–5.5)
Alkaline Phosphatase: 44 IU/L (ref 39–117)
Bilirubin Total: <0.2 mg/dL (ref 0.0–1.2)
Bilirubin, Direct: 0.06 mg/dL (ref 0.00–0.40)
TOTAL PROTEIN: 7.1 g/dL (ref 6.0–8.5)

## 2018-05-18 LAB — LIPID PANEL W/O CHOL/HDL RATIO
Cholesterol, Total: 234 mg/dL — ABNORMAL HIGH (ref 100–199)
HDL: 52 mg/dL (ref >39)
LDL CALC: 155 mg/dL — AB (ref 0–99)
Triglycerides: 133 mg/dL (ref 0–149)
VLDL CHOLESTEROL CAL: 27 mg/dL (ref 5–40)

## 2018-05-18 LAB — VITAMIN D 25 HYDROXY (VIT D DEFICIENCY, FRACTURES): VIT D 25 HYDROXY: 20.3 ng/mL — AB (ref 30.0–100.0)

## 2018-05-18 LAB — PROLACTIN: PROLACTIN: 11.6 ng/mL (ref 4.8–23.3)

## 2018-05-18 LAB — VALPROIC ACID LEVEL: Valproic Acid Lvl: 87 ug/mL (ref 50–100)

## 2018-05-18 LAB — HGB A1C W/O EAG: Hgb A1c MFr Bld: 5.4 % (ref 4.8–5.6)

## 2018-07-03 ENCOUNTER — Ambulatory Visit: Payer: 59 | Admitting: Psychiatry

## 2018-07-10 ENCOUNTER — Other Ambulatory Visit: Payer: Self-pay

## 2018-07-10 ENCOUNTER — Encounter: Payer: Self-pay | Admitting: Psychiatry

## 2018-07-10 ENCOUNTER — Ambulatory Visit: Payer: 59 | Admitting: Psychiatry

## 2018-07-10 VITALS — BP 131/80 | HR 64 | Temp 97.6°F | Wt 203.6 lb

## 2018-07-10 DIAGNOSIS — F319 Bipolar disorder, unspecified: Secondary | ICD-10-CM | POA: Diagnosis not present

## 2018-07-10 DIAGNOSIS — E559 Vitamin D deficiency, unspecified: Secondary | ICD-10-CM

## 2018-07-10 DIAGNOSIS — F5105 Insomnia due to other mental disorder: Secondary | ICD-10-CM

## 2018-07-10 NOTE — Progress Notes (Signed)
[facility] MD OP Progress Note  07/10/2018 5:11 PM Barbara [name]  MRN:  [id number]  Chief Complaint:  Chief Complaint    Follow-up; Medication Refill     HPI: Barbara [name] is a 52 year old Caucasian female, lives in [place], employed, divorced, has a history of bipolar disorder, currently in remission, presented to the clinic today for a follow-up visit.  Patient today reports that she is currently improving on the current medication regimen.  She reports the increased dosage of Latuda has helped with her racing thoughts.  She denies any significant side effects to the [place].  She does report some tremors but it does not happen frequently and it does not distress her.  Patient reports her sleep has improved.  She reports the higher dosage of trazodone has helped.  Patient reports she also has started following some sleep hygiene techniques and that has also helped.  She continues to take hydroxyzine as needed for anxiety at night which helps.  Patient reports she and her family are going on a vacation to the mountains soon.  She looks forward to that.  Discussed her most recent labs with patient.  Her Depakote level is therapeutic.  Discussed with patient that she can be continued on the same dosage of Depakote.  Patient has low vitamin D and abnormal lipid panel.  Advised patient to follow-up with her primary medical doctor.  Patient denies any suicidality.  Patient denies any perceptual disturbances.  Patient denies any homicidality.     Visit Diagnosis:    ICD-10-CM   1. Bipolar I disorder, most recent episode depressed with anxious distress ([place]) F31.9   2. Insomnia due to mental disorder F51.05   3. Vitamin D deficiency E55.9     Past Psychiatric History: I have reviewed past psychiatric history from my progress note on 02/12/2018.  Past Medical History:  Past Medical History:  Diagnosis Date  . Acid reflux   . Adnexal mass   . Bipolar disorder ([place])   . Depression   . Depression    . Herpes   . Irregular menstrual bleeding   . Melanoma [facility])     Past Surgical History:  Procedure Laterality Date  . CERVICAL BIOPSY     Dr. Kenton Kingfisher with [facility]  . COLONOSCOPY    . COLONOSCOPY    . DILATION AND CURETTAGE OF UTERUS    . MELANOMA EXCISION    . TONSILLECTOMY      Family Psychiatric History: I have reviewed family psychiatric history from my progress note on 02/12/2018  Family History:  Family History  Problem Relation Age of Onset  . Heart attack Father   . Cancer Father        melanoma  . Cancer Maternal Aunt        breast  . Cancer Paternal Aunt        breast    Social History: Have reviewed social history from my progress note on 02/12/2018. Social History   Socioeconomic History  . Marital status: Divorced    Spouse name: Not on file  . Number of children: 1  . Years of education: Not on file  . Highest education level: Associate degree: occupational, Hotel manager, or vocational program  Occupational History    Comment: full time  Social Needs  . Financial resource strain: Not hard at all  . Food insecurity:    Worry: Never true    Inability: Never true  . Transportation needs:    Medical: No  Non-medical: No  Tobacco Use  . Smoking status: Former Smoker    Types: Cigarettes    Last attempt to quit: 02/12/1990    Years since quitting: 28.4  . Smokeless tobacco: Never Used  Substance and Sexual Activity  . Alcohol use: No    Alcohol/week: 0.0 standard drinks  . Drug use: No  . Sexual activity: Not Currently  Lifestyle  . Physical activity:    Days per week: 0 days    Minutes per session: 0 min  . Stress: Rather much  Relationships  . Social connections:    Talks on phone: More than three times a week    Gets together: More than three times a week    Attends religious service: More than 4 times per year    Active member of club or organization: No    Attends meetings of clubs or organizations: Never    Relationship status:  Divorced  Other Topics Concern  . Not on file  Social History Narrative  . Not on file    Allergies:  Allergies  Allergen Reactions  . Penicillins Hives and Other (See Comments)    delusional  . Sulfa Antibiotics Hives and Other (See Comments)    dellusional  . Prednisone Other (See Comments)    Heart racing    Metabolic Disorder Labs: Lab Results  Component Value Date   HGBA1C 5.4 05/17/2018   Lab Results  Component Value Date   PROLACTIN 11.6 05/17/2018   Lab Results  Component Value Date   CHOL 234 (H) 05/17/2018   TRIG 133 05/17/2018   HDL 52 05/17/2018   CHOLHDL 4.3 10/14/2015   LDLCALC 155 (H) 05/17/2018   LDLCALC 132 (H) 10/14/2015   Lab Results  Component Value Date   TSH 4.030 08/17/2017   TSH 2.770 10/14/2015    Therapeutic Level Labs: No results found for: LITHIUM Lab Results  Component Value Date   VALPROATE 87 05/17/2018   VALPROATE 63 08/17/2017   No components found for:  CBMZ  Current Medications: Current Outpatient Medications  Medication Sig Dispense Refill  . divalproex (DEPAKOTE ER) 500 MG 24 hr tablet Take 2 tablets (1,000 mg total) by mouth daily. 180 tablet 1  . hydrOXYzine (ATARAX/VISTARIL) 25 MG tablet Take 1 tablet (25 mg total) by mouth at bedtime as needed (sleep). 90 tablet 1  . Lurasidone HCl 60 MG TABS Take 1 tablet (60 mg total) by mouth daily with supper. 90 tablet 1  . neomycin-polymyxin b-dexamethasone (MAXITROL) 3.5-10000-0.1 SUSP INSTILL 1 DROP INTO LEFT EYE 4 TIMES A DAY  0  . pimecrolimus (ELIDEL) 1 % cream Apply topically.    . traZODone (DESYREL) 100 MG tablet Take 1.5 tablets (150 mg total) by mouth at bedtime. 135 tablet 1   No current facility-administered medications for this visit.      Musculoskeletal: Strength & Muscle Tone: within normal limits Gait & Station: normal Patient leans: N/A  Psychiatric Specialty Exam: Review of Systems  Psychiatric/Behavioral: The patient is nervous/anxious and has  insomnia (improving).   All other systems reviewed and are negative.   Blood pressure 131/80, pulse 64, temperature 97.6 F (36.4 C), temperature source Oral, weight 203 lb 9.6 oz (92.4 kg).Body mass index is 30.73 kg/m.  General Appearance: Casual  Eye Contact:  Fair  Speech:  Clear and Coherent  Volume:  Normal  Mood:  Anxious improving  Affect:  Congruent  Thought Process:  Goal Directed and Descriptions of Associations: Intact  Orientation:  Full (  Time, Place, and Person)  Thought Content: Logical   Suicidal Thoughts:  No  Homicidal Thoughts:  No  Memory:  Immediate;   Fair Recent;   Fair Remote;   Fair  Judgement:  Fair  Insight:  Fair  Psychomotor Activity:  Normal  Concentration:  Concentration: Fair and Attention Span: Fair  Recall:  [employer] of Knowledge: Fair  Language: Fair  Akathisia:  No  Handed:  Right  AIMS (if indicated): 0  Assets:  Communication Skills Desire for Improvement Social Support  ADL's:  Intact  Cognition: WNL  Sleep:  improving   Screenings: AIMS     Office Visit from 02/12/2018 in [facility] Total Score  0    PHQ2-9     Office Visit from 01/11/2018 in [facility] Office Visit from 08/16/2017 in [facility] Office Visit from 12/14/2016 in [facility] Office Visit from 09/05/2016 in [facility] Office Visit from 03/06/2016 in [facility]  PHQ-2 Total Score  0  0  0  0  0       Assessment and Plan: Abagail is a 52 year old Caucasian female, employed, divorced, lives in [place], has a history of bipolar disorder, presented to the clinic today for a follow-up visit.  Patient reports improvement on the recent medication changes.  Will continue plan as noted below.  Plan Bipolar type I Continue Depakote ER 1000 mg at bedtime. Depakote level-therapeutic-87-on 05/17/2018.  Discussed the same with patient. Hepatic  function-within normal limits. Continue Latuda 60 mg p.o. daily with meals.  For insomnia Continue trazodone 150 mg p.o. nightly She will continue sleep hygiene techniques. She also has hydroxyzine as needed at bedtime if she needs it for anxiety symptoms  Also discussed her vitamin D level-deficient as well as abnormal lipid panel with patient. Also discussed taking Vitamin D supplements . Pt reports she could get it OTC. Patient advised to follow-up with her primary medical doctor.  Follow-up in clinic in 3-4 months or sooner if needed.  More than 50 % of the time was spent for psychoeducation and supportive psychotherapy and care coordination.  This note was generated in part or whole with voice recognition software. Voice recognition is usually quite accurate but there are transcription errors that can and very often do occur. I apologize for any typographical errors that were not detected and corrected.       Ursula Alert, MD 07/10/2018, 5:11 PM

## 2018-07-19 ENCOUNTER — Ambulatory Visit: Payer: Managed Care, Other (non HMO) | Admitting: Family Medicine

## 2018-08-31 ENCOUNTER — Emergency Department: Payer: Managed Care, Other (non HMO)

## 2018-08-31 ENCOUNTER — Encounter: Payer: Self-pay | Admitting: Emergency Medicine

## 2018-08-31 ENCOUNTER — Emergency Department
Admission: EM | Admit: 2018-08-31 | Discharge: 2018-08-31 | Disposition: A | Discharge status: Home / Self Care | Payer: Managed Care, Other (non HMO) | Attending: Emergency Medicine | Admitting: Emergency Medicine

## 2018-08-31 ENCOUNTER — Other Ambulatory Visit: Payer: Self-pay

## 2018-08-31 DIAGNOSIS — R933 Abnormal findings on diagnostic imaging of other parts of digestive tract: Secondary | ICD-10-CM | POA: Insufficient documentation

## 2018-08-31 DIAGNOSIS — R109 Unspecified abdominal pain: Secondary | ICD-10-CM | POA: Diagnosis present

## 2018-08-31 DIAGNOSIS — Z85820 Personal history of malignant melanoma of skin: Secondary | ICD-10-CM | POA: Insufficient documentation

## 2018-08-31 DIAGNOSIS — Z87891 Personal history of nicotine dependence: Secondary | ICD-10-CM | POA: Insufficient documentation

## 2018-08-31 LAB — CBC WITH DIFFERENTIAL/PLATELET
Abs Immature Granulocytes: 0.03 10*3/uL (ref 0.00–0.07)
BASOS PCT: 1 %
Basophils Absolute: 0 10*3/uL (ref 0.0–0.1)
EOS ABS: 0.2 10*3/uL (ref 0.0–0.5)
Eosinophils Relative: 3 %
HCT: 43.3 % (ref 36.0–46.0)
Hemoglobin: 14 g/dL (ref 12.0–15.0)
IMMATURE GRANULOCYTES: 1 %
Lymphocytes Relative: 42 %
Lymphs Abs: 2.5 10*3/uL (ref 0.7–4.0)
MCH: 29.9 pg (ref 26.0–34.0)
MCHC: 32.3 g/dL (ref 30.0–36.0)
MCV: 92.3 fL (ref 80.0–100.0)
Monocytes Absolute: 0.6 10*3/uL (ref 0.1–1.0)
Monocytes Relative: 10 %
NEUTROS PCT: 43 %
NRBC: 0 % (ref 0.0–0.2)
Neutro Abs: 2.7 10*3/uL (ref 1.7–7.7)
PLATELETS: 176 10*3/uL (ref 150–400)
RBC: 4.69 MIL/uL (ref 3.87–5.11)
RDW: 13.1 % (ref 11.5–15.5)
WBC: 6 10*3/uL (ref 4.0–10.5)

## 2018-08-31 LAB — COMPREHENSIVE METABOLIC PANEL
ALT: 12 U/L (ref 0–44)
ANION GAP: 10 (ref 5–15)
AST: 20 U/L (ref 15–41)
Albumin: 3.8 g/dL (ref 3.5–5.0)
Alkaline Phosphatase: 38 U/L (ref 38–126)
BUN: 24 mg/dL — ABNORMAL HIGH (ref 6–20)
CO2: 23 mmol/L (ref 22–32)
Calcium: 8.8 mg/dL — ABNORMAL LOW (ref 8.9–10.3)
Chloride: 106 mmol/L (ref 98–111)
Creatinine, Ser: 1.15 mg/dL — ABNORMAL HIGH (ref 0.44–1.00)
GFR, EST NON AFRICAN AMERICAN: 53 mL/min — AB (ref >60)
Glucose, Bld: 86 mg/dL (ref 70–99)
POTASSIUM: 4.7 mmol/L (ref 3.5–5.1)
Sodium: 139 mmol/L (ref 135–145)
Total Bilirubin: 0.6 mg/dL (ref 0.3–1.2)
Total Protein: 6.9 g/dL (ref 6.5–8.1)

## 2018-08-31 LAB — URINALYSIS, COMPLETE (UACMP) WITH MICROSCOPIC
Bacteria, UA: NONE SEEN
Bilirubin Urine: NEGATIVE
GLUCOSE, UA: NEGATIVE mg/dL
HGB URINE DIPSTICK: NEGATIVE
Ketones, ur: NEGATIVE mg/dL
LEUKOCYTES UA: NEGATIVE
Nitrite: NEGATIVE
PH: 6 (ref 5.0–8.0)
PROTEIN: NEGATIVE mg/dL
RBC / HPF: NONE SEEN RBC/hpf (ref 0–5)
Specific Gravity, Urine: >1.03 — ABNORMAL HIGH (ref 1.005–1.030)

## 2018-08-31 LAB — LIPASE, BLOOD: LIPASE: 36 U/L (ref 11–51)

## 2018-08-31 MED ORDER — KETOROLAC TROMETHAMINE 30 MG/ML IJ SOLN
30.0000 mg | Freq: Once | INTRAMUSCULAR | Status: AC
  Administered 2018-08-31: 30 mg via INTRAVENOUS
  Filled 2018-08-31: qty 1

## 2018-08-31 MED ORDER — DIAZEPAM 5 MG PO TABS: 5.0000 mg | ORAL_TABLET | Freq: Three times a day (TID) | ORAL | 0 refills | Status: DC | PRN

## 2018-08-31 MED ORDER — IBUPROFEN 800 MG PO TABS: 800.0000 mg | ORAL_TABLET | Freq: Three times a day (TID) | ORAL | 0 refills | Status: DC | PRN

## 2018-08-31 NOTE — ED Triage Notes (Signed)
Was seen at [facility] yesterday and told if not feeling better to come here today and get an xray.  Pain is to right flank and around to right mid abdomen. Denies NVD. No fevers.  No noted hematuria. UA in care everywhere from yesterday.

## 2018-08-31 NOTE — ED Provider Notes (Signed)
[facility] Emergency Department Provider Note       Time seen: ----------------------------------------- 8:22 AM on 08/31/2018 -----------------------------------------   I have reviewed the triage vital signs and the nursing notes.  HISTORY   Chief Complaint Flank Pain    HPI Barbara [name] is a 52 y.o. female with a history of acid reflux, bipolar disorder, depression, herpes, melanoma who presents to the ED for right flank pain.  She was seen at [facility] yesterday and told if she was not feeling any better to come here today to have an x-ray to look at her gallbladder.  Today the pain is in the right side and sometimes radiates from the back to the front.  She denies any nausea, vomiting or diarrhea or fever.  UA from yesterday was negative.  Past Medical History:  Diagnosis Date  . Acid reflux   . Adnexal mass   . Bipolar disorder ([place])   . Depression   . Depression   . Herpes   . Irregular menstrual bleeding   . Melanoma [facility]     Patient Active Problem List   Diagnosis Date Noted  . Renal insufficiency 01/11/2018  . 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 Family hx of melanoma 01/11/2018  . Hx of malignant melanoma 01/11/2018  . Hyperplastic colon polyp 09/27/2017  . Medication monitoring encounter 09/05/2016  . Therapeutic drug monitoring 09/05/2016  . Chest pain 02/09/2016  . Hyperlipidemia 02/09/2016  . Obesity 02/09/2016  . Insomnia 09/27/2015  . Bipolar 1 disorder, depressed, partial remission ([place]) 09/27/2015  . Encounter for cholesteral screening for cardiovascular disease 09/27/2015  . Other fatigue 09/27/2015    Past Surgical History:  Procedure Laterality Date  . CERVICAL BIOPSY     Dr. Kenton Kingfisher with [facility]  . COLONOSCOPY    . COLONOSCOPY    . DILATION AND CURETTAGE OF UTERUS    . MELANOMA EXCISION    . TONSILLECTOMY      Allergies Penicillins; Sulfa antibiotics; and Prednisone  Social History Social  History   Tobacco Use  . Smoking status: Former Smoker    Types: Cigarettes    Last attempt to quit: 02/12/1990    Years since quitting: 28.5  . Smokeless tobacco: Never Used  Substance Use Topics  . Alcohol use: No    Alcohol/week: 0.0 standard drinks  . Drug use: No   Review of Systems Constitutional: Negative for fever. Cardiovascular: Negative for chest pain. Respiratory: Negative for shortness of breath. Gastrointestinal: Positive for right-sided abdominal pain, negative for vomiting or diarrhea Genitourinary: Negative for dysuria. Musculoskeletal: Negative for back pain. Skin: Negative for rash. Neurological: Negative for headaches, focal weakness or numbness.  All systems negative/normal/unremarkable except as stated in the HPI  ____________________________________________   PHYSICAL EXAM:  VITAL SIGNS: ED Triage Vitals [08/31/18 0820]  Enc Vitals Group     BP      Pulse      Resp      Temp      Temp src      SpO2      Weight 200 lb (90.7 kg)     Height 5\' 9"  (1.753 m)     Head Circumference      Peak Flow      Pain Score 8     Pain Loc      Pain Edu?      Excl. in [place]?    Constitutional: Alert and oriented. Well appearing and in no distress. Eyes: Conjunctivae are normal.  Normal extraocular movements. Cardiovascular: Normal rate, regular rhythm. No murmurs, rubs, or gallops. Respiratory: Normal respiratory effort without tachypnea nor retractions. Breath sounds are clear and equal bilaterally. No wheezes/rales/rhonchi. Gastrointestinal: Right flank and right upper quadrant tenderness, no rebound or guarding.  Normal bowel sounds. Musculoskeletal: Nontender with normal range of motion in extremities. No lower extremity tenderness nor edema.  Reproducible right inferior axillary rib tenderness Neurologic:  Normal speech and language. No gross focal neurologic deficits are appreciated.  Skin:  Skin is warm, dry and intact. No rash noted. Psychiatric: Mood and  affect are normal. Speech and behavior are normal.  ____________________________________________  ED COURSE:  As part of my medical decision making, I reviewed the following data within the [place] History obtained from family if available, nursing notes, old chart and ekg, as well as notes from prior ED visits. Patient presented for right upper quadrant pain, we will assess with labs and imaging as indicated at this time.   Procedures ____________________________________________   LABS (pertinent positives/negatives)  Labs Reviewed  COMPREHENSIVE METABOLIC PANEL - Abnormal; Notable for the following components:      Result Value   BUN 24 (*)    Creatinine, Ser 1.15 (*)    Calcium 8.8 (*)    GFR calc non Af Amer 53 (*)    All other components within normal limits  CBC WITH DIFFERENTIAL/PLATELET  LIPASE, BLOOD  URINALYSIS, COMPLETE (UACMP) WITH MICROSCOPIC    RADIOLOGY Images were viewed by me  Right upper quadrant ultrasound IMPRESSION: No acute obstructing urinary tract or ureteral calculus. No acute obstructive uropathy or hydronephrosis.  Indeterminate hepatic hypodensities.  Aortic atherosclerosis without aneurysm  Normal retrocecal appendix  Indeterminate soft tissue prominence/mass like area at the ileocecal valve within the cecum. Difficult to exclude underlying nonobstructing cecal mass. Consider follow-up nonemergent GI/colonoscopy evaluation. IMPRESSION: 1. No acute or focal lesion to explain the patient's abdominal pain. 2. Known hemangioma of the right lobe of the liver.  ____________________________________________  DIFFERENTIAL DIAGNOSIS   Musculoskeletal pain, biliary colic, cholecystitis, renal colic,  pyelonephritis  FINAL ASSESSMENT AND PLAN  Flank pain   Plan: The patient had presented for right flank pain which appears to be musculoskeletal in origin. Patient's labs are unremarkable. Patient's imaging is also  unremarkable.  Patient admits to recently doing some heavy lifting.  I did inform her of the mass within her ileocecal valve and have encouraged colonoscopy.  She will be discharged with NSAIDs, muscle relaxants and is cleared for outpatient follow-up.   Laurence Aly, MD   Note: This note was generated in part or whole with voice recognition software. Voice recognition is usually quite accurate but there are transcription errors that can and very often do occur. I apologize for any typographical errors that were not detected and corrected.     Earleen Newport, MD 08/31/18 1145

## 2018-09-09 ENCOUNTER — Encounter: Payer: Self-pay | Admitting: Nurse Practitioner

## 2018-09-09 ENCOUNTER — Ambulatory Visit (INDEPENDENT_AMBULATORY_CARE_PROVIDER_SITE_OTHER): Payer: Managed Care, Other (non HMO) | Admitting: Nurse Practitioner

## 2018-09-09 VITALS — BP 100/80 | HR 70 | Temp 97.9°F | Resp 16 | Ht 67.32 in | Wt 201.7 lb

## 2018-09-09 DIAGNOSIS — R944 Abnormal results of kidney function studies: Secondary | ICD-10-CM

## 2018-09-09 DIAGNOSIS — E559 Vitamin D deficiency, unspecified: Secondary | ICD-10-CM | POA: Diagnosis not present

## 2018-09-09 DIAGNOSIS — E7841 Elevated Lipoprotein(a): Secondary | ICD-10-CM

## 2018-09-09 DIAGNOSIS — Z0000 Encounter for general adult medical examination without abnormal findings: Secondary | ICD-10-CM

## 2018-09-09 NOTE — Progress Notes (Signed)
Name: Barbara [name]   MRN: [id number]    DOB: 08-Jan-1966   Date:09/09/2018       Progress Note  Subjective  Chief Complaint  Chief Complaint  Patient presents with  . Annual Exam    HPI   Patient presents for annual CPE .  Diet: was doing good earlier this year but has been eating some hamburger and hotdogs. Gets at least 2 fruits and vegetables a day. Likes diet pepsi, working on drinking more water- 2-3 bottles recently  Exercise: 15 minutes a day of walking 4 days a week.   USPSTF grade A and B recommendations    Office Visit from 09/09/2018 in [facility]  AUDIT-C Score  0     Depression:  Depression screen [facility] 2/9 09/09/2018 09/09/2018 01/11/2018 08/16/2017 12/14/2016  Decreased Interest 0 0 0 0 0  Down, Depressed, Hopeless 0 0 0 0 0  PHQ - 2 Score 0 0 0 0 0  Altered sleeping 1 - - - -  Tired, decreased energy 0 - - - -  Change in appetite 1 - - - -  Feeling bad or failure about yourself  0 - - - -  Trouble concentrating 0 - - - -  Moving slowly or fidgety/restless 0 - - - -  Suicidal thoughts 0 - - - -  PHQ-9 Score 2 - - - -  Difficult doing work/chores Not difficult at all - - - -   Hypertension: BP Readings from Last 3 Encounters:  09/09/18 100/80  08/31/18 (!) 122/59  01/11/18 112/62   Obesity: Wt Readings from Last 3 Encounters:  09/09/18 201 lb 11.2 oz (91.5 kg)  08/31/18 200 lb (90.7 kg)  01/11/18 204 lb 8 oz (92.8 kg)   BMI Readings from Last 3 Encounters:  09/09/18 31.29 kg/m  08/31/18 29.53 kg/m  01/11/18 30.87 kg/m    Hep C Screening:  STD testing and prevention (HIV/chl/gon/syphilis): declines  Intimate partner violence: denies  Sexual History/Pain during Intercourse: denies  Menstrual History/LMP/Abnormal Bleeding: denies Incontinence Symptoms: mild stress incontinence   Advanced Care Planning: A voluntary discussion about advance care planning including the explanation and discussion of advance directives.   Discussed health care proxy and Living will, and the patient was able to identify a health care proxy as son, Leightyn Cina.  Patient does not have a living will at present time. If patient does have living will, I have requested they bring this to the clinic to be scanned in to their chart.  Breast cancer: mammogram due 02/20/2019 Cervical cancer screening: due 2021; states has it scheduled for Friday with OBGYN  Lipids:  Lab Results  Component Value Date   CHOL 234 (H) 05/17/2018   CHOL 210 (H) 10/14/2015   Lab Results  Component Value Date   HDL 52 05/17/2018   HDL 49 10/14/2015   Lab Results  Component Value Date   LDLCALC 155 (H) 05/17/2018   LDLCALC 132 (H) 10/14/2015   Lab Results  Component Value Date   TRIG 133 05/17/2018   TRIG 144 10/14/2015   Lab Results  Component Value Date   CHOLHDL 4.3 10/14/2015   No results found for: LDLDIRECT  Glucose:  Glucose  Date Value Ref Range Status  01/11/2018 66 65 - 99 mg/dL Final  08/17/2017 82 65 - 99 mg/dL Final  09/07/2016 81 65 - 99 mg/dL Final   Glucose, Bld  Date Value Ref Range Status  08/31/2018 86 70 - 99  mg/dL Final  09/06/2017 87 65 - 99 mg/dL Final  02/04/2016 92 65 - 99 mg/dL Final    Skin cancer: sees dermatology Dr. Evorn Gong  Colorectal cancer: colonoscopy completed in 2014 but had CT scan 2 weeks ago showings soft tissue mass at ileocecal valve- GI referral placed in ER patient states has appointment with Dr. Tiffany Kocher- GI November 21st for initial consult Lung cancer:  Former smoker 1991  The 10-year ASCVD risk score Mikey Bussing DC Jr., et al., 2013) is: 1.4%   Values used to calculate the score:     Age: 30 years     Sex: Female     Is Non-Hispanic African American: No     Diabetic: No     Tobacco smoker: No     Systolic Blood Pressure: 623 mmHg     Is BP treated: No     HDL Cholesterol: 52 mg/dL     Total Cholesterol: 234 mg/dL    Patient Active Problem List   Diagnosis Date Noted  . Renal  insufficiency 01/11/2018  . 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 Family hx of melanoma 01/11/2018  . Hx of malignant melanoma 01/11/2018  . Hyperplastic colon polyp 09/27/2017  . Medication monitoring encounter 09/05/2016  . Therapeutic drug monitoring 09/05/2016  . Chest pain 02/09/2016  . Hyperlipidemia 02/09/2016  . Obesity 02/09/2016  . Insomnia 09/27/2015  . Bipolar 1 disorder, depressed, partial remission ([place]) 09/27/2015  . Encounter for cholesteral screening for cardiovascular disease 09/27/2015  . Other fatigue 09/27/2015    Past Surgical History:  Procedure Laterality Date  . CERVICAL BIOPSY     Dr. Kenton Kingfisher with [facility]  . COLONOSCOPY    . COLONOSCOPY    . DILATION AND CURETTAGE OF UTERUS    . MELANOMA EXCISION    . TONSILLECTOMY      Family History  Problem Relation Age of Onset  . Heart attack Father   . Cancer Father        melanoma  . Cancer Maternal Aunt        breast  . Cancer Paternal Aunt        breast    Social History   Socioeconomic History  . Marital status: Divorced    Spouse name: Not on file  . Number of children: 1  . Years of education: Not on file  . Highest education level: Associate degree: occupational, Hotel manager, or vocational program  Occupational History    Comment: full time  Social Needs  . Financial resource strain: Not hard at all  . Food insecurity:    Worry: Never true    Inability: Never true  . Transportation needs:    Medical: No    Non-medical: No  Tobacco Use  . Smoking status: Former Smoker    Types: Cigarettes    Last attempt to quit: 02/12/1990    Years since quitting: 28.5  . Smokeless tobacco: Never Used  Substance and Sexual Activity  . Alcohol use: No    Alcohol/week: 0.0 standard drinks  . Drug use: No  . Sexual activity: Not Currently  Lifestyle  . Physical activity:    Days per week: 0 days    Minutes per session: 0 min  . Stress: Rather much  Relationships  . Social connections:    Talks on  phone: More than three times a week    Gets together: More than three times a week    Attends religious service: More than 4 times per year  Active member of club or organization: No    Attends meetings of clubs or organizations: Never    Relationship status: Divorced  . Intimate partner violence:    Fear of current or ex partner: No    Emotionally abused: No    Physically abused: No    Forced sexual activity: No  Other Topics Concern  . Not on file  Social History Narrative  . Not on file     Current Outpatient Medications:  .  diazepam (VALIUM) 5 MG tablet, Take 1 tablet (5 mg total) by mouth every 8 (eight) hours as needed for muscle spasms., Disp: 20 tablet, Rfl: 0 .  divalproex (DEPAKOTE ER) 500 MG 24 hr tablet, Take 2 tablets (1,000 mg total) by mouth daily., Disp: 180 tablet, Rfl: 1 .  hydrOXYzine (ATARAX/VISTARIL) 25 MG tablet, Take 1 tablet (25 mg total) by mouth at bedtime as needed (sleep)., Disp: 90 tablet, Rfl: 1 .  ibuprofen (ADVIL,MOTRIN) 800 MG tablet, Take 1 tablet (800 mg total) by mouth every 8 (eight) hours as needed., Disp: 30 tablet, Rfl: 0 .  Lurasidone HCl 60 MG TABS, Take 1 tablet (60 mg total) by mouth daily with supper., Disp: 90 tablet, Rfl: 1 .  neomycin-polymyxin b-dexamethasone (MAXITROL) 3.5-10000-0.1 SUSP, INSTILL 1 DROP INTO LEFT EYE 4 TIMES A DAY, Disp: , Rfl: 0 .  pimecrolimus (ELIDEL) 1 % cream, Apply topically., Disp: , Rfl:  .  traZODone (DESYREL) 100 MG tablet, Take 1.5 tablets (150 mg total) by mouth at bedtime., Disp: 135 tablet, Rfl: 1  Allergies  Allergen Reactions  . Penicillins Hives and Other (See Comments)    delusional  . Sulfa Antibiotics Hives and Other (See Comments)    dellusional  . Prednisone Other (See Comments)    Heart racing     ROS    Objective  Vitals:   09/09/18 1340  BP: 100/80  Pulse: 70  Resp: 16  Temp: 97.9 F (36.6 C)  TempSrc: Oral  SpO2: 99%  Weight: 201 lb 11.2 oz (91.5 kg)  Height: 5' 7.32"  (1.71 m)    Body mass index is 31.29 kg/m.  Physical Exam  Constitutional: Patient appears well-developed and well-nourished. No distress.  HENT: Head: Normocephalic and atraumatic. Ears: B TMs ok, no erythema or effusion; Nose: Nose normal. Mouth/Throat: Oropharynx is clear and moist. No oropharyngeal exudate.  Eyes: Conjunctivae and EOM are normal. Pupils are equal, round, and reactive to light. No scleral icterus.  Neck: Normal range of motion. Neck supple. No JVD present. No thyromegaly present.  Cardiovascular: Normal rate, regular rhythm and normal heart sounds.  No murmur heard. No BLE edema. Pulmonary/Chest: Effort normal and breath sounds normal. No respiratory distress. Abdominal: Soft. Bowel sounds are normal, no distension. There is no tenderness. no masses Breast: no lumps or masses, no nipple discharge or rashes FEMALE GENITALIA: deferred Musculoskeletal: Normal range of motion, no joint effusions. No gross deformities. Some lowe back tenderness with deep palpation- seeing chiropracter Neurological: he is alert and oriented to person, place, and time. No cranial nerve deficit. Coordination, balance, strength, speech and gait are normal.  Skin: Skin is warm and dry. Rash noted on abdomen, under bilateral breasts and by left ear- Darier Disease. No erythema.  Psychiatric: Patient has a normal mood and affect. behavior is normal. Judgment and thought content normal.  Recent Results (from the past 2160 hour(s))  CBC with Differential     Status: None   Collection Time: 08/31/18  8:59 AM  Result Value Ref Range   WBC 6.0 4.0 - 10.5 K/uL   RBC 4.69 3.87 - 5.11 MIL/uL   Hemoglobin 14.0 12.0 - 15.0 g/dL   HCT 43.3 36.0 - 46.0 %   MCV 92.3 80.0 - 100.0 fL   MCH 29.9 26.0 - 34.0 pg   MCHC 32.3 30.0 - 36.0 g/dL   RDW 13.1 11.5 - 15.5 %   Platelets 176 150 - 400 K/uL   nRBC 0.0 0.0 - 0.2 %   Neutrophils Relative % 43 %   Neutro Abs 2.7 1.7 - 7.7 K/uL   Lymphocytes Relative 42  %   Lymphs Abs 2.5 0.7 - 4.0 K/uL   Monocytes Relative 10 %   Monocytes Absolute 0.6 0.1 - 1.0 K/uL   Eosinophils Relative 3 %   Eosinophils Absolute 0.2 0.0 - 0.5 K/uL   Basophils Relative 1 %   Basophils Absolute 0.0 0.0 - 0.1 K/uL   Immature Granulocytes 1 %   Abs Immature Granulocytes 0.03 0.00 - 0.07 K/uL    Comment: Performed at [facility], [address] [place], [place] [address]  Comprehensive metabolic panel     Status: Abnormal   Collection Time: 08/31/18  8:59 AM  Result Value Ref Range   Sodium 139 135 - 145 mmol/L   Potassium 4.7 3.5 - 5.1 mmol/L   Chloride 106 98 - 111 mmol/L   CO2 23 22 - 32 mmol/L   Glucose, Bld 86 70 - 99 mg/dL   BUN 24 (H) 6 - 20 mg/dL   Creatinine, Ser 1.15 (H) 0.44 - 1.00 mg/dL   Calcium 8.8 (L) 8.9 - 10.3 mg/dL   Total Protein 6.9 6.5 - 8.1 g/dL   Albumin 3.8 3.5 - 5.0 g/dL   AST 20 15 - 41 U/L   ALT 12 0 - 44 U/L   Alkaline Phosphatase 38 38 - 126 U/L   Total Bilirubin 0.6 0.3 - 1.2 mg/dL   GFR calc non Af Amer 53 (L) >60 mL/min   GFR calc Af Amer >60 >60 mL/min    Comment: (NOTE) The eGFR has been calculated using the CKD EPI equation. This calculation has not been validated in all clinical situations. eGFR's persistently <60 mL/min signify possible Chronic Kidney Disease.    Anion gap 10 5 - 15    Comment: Performed at [facility], [address] [place], [place] [address]  Lipase, blood     Status: None   Collection Time: 08/31/18  8:59 AM  Result Value Ref Range   Lipase 36 11 - 51 U/L    Comment: Performed at [facility], [address] [place], [place] [address]  Urinalysis, Complete w Microscopic     Status: Abnormal   Collection Time: 08/31/18 12:13 PM  Result Value Ref Range   Color, Urine YELLOW YELLOW   APPearance CLEAR CLEAR   Specific Gravity, Urine >1.030 (H) 1.005 - 1.030   pH 6.0 5.0 - 8.0   Glucose, UA NEGATIVE NEGATIVE mg/dL   Hgb urine dipstick NEGATIVE NEGATIVE    Bilirubin Urine NEGATIVE NEGATIVE   Ketones, ur NEGATIVE NEGATIVE mg/dL   Protein, ur NEGATIVE NEGATIVE mg/dL   Nitrite NEGATIVE NEGATIVE   Leukocytes, UA NEGATIVE NEGATIVE   Squamous Epithelial / LPF 0-5 0 - 5   WBC, UA 0-5 0 - 5 WBC/hpf   RBC / HPF NONE SEEN 0 - 5 RBC/hpf   Bacteria, UA NONE SEEN NONE SEEN    Comment:  Performed at [facility], [address] [place], [place] [address]     PHQ2/9: Depression screen [facility] 2/9 09/09/2018 09/09/2018 01/11/2018 08/16/2017 12/14/2016  Decreased Interest 0 0 0 0 0  Down, Depressed, Hopeless 0 0 0 0 0  PHQ - 2 Score 0 0 0 0 0  Altered sleeping 1 - - - -  Tired, decreased energy 0 - - - -  Change in appetite 1 - - - -  Feeling bad or failure about yourself  0 - - - -  Trouble concentrating 0 - - - -  Moving slowly or fidgety/restless 0 - - - -  Suicidal thoughts 0 - - - -  PHQ-9 Score 2 - - - -  Difficult doing work/chores Not difficult at all - - - -    Fall Risk: Fall Risk  09/09/2018 01/11/2018 08/16/2017 12/14/2016 09/05/2016  Falls in the past year? No No No No No    Functional Status Survey: Is the patient deaf or have difficulty hearing?: No Does the patient have difficulty concentrating, remembering, or making decisions?: No Does the patient have difficulty walking or climbing stairs?: No Does the patient have difficulty dressing or bathing?: No Does the patient have difficulty doing errands alone such as visiting a doctor's office or shopping?: No Assessment & Plan  1. Well adult exam  - Basic Metabolic Panel (BMET) - Vitamin D (25 hydroxy)  - Lipid Profile  2. Vitamin D deficiency  - Vitamin D (25 hydroxy)  3. Decreased GFR  - Basic Metabolic Panel (BMET)  4. Elevated lipoprotein(a)  - Lipid Profile   -USPSTF grade A and B recommendations reviewed with patient; age-appropriate recommendations, preventive care, screening tests, etc discussed and encouraged; healthy living encouraged; see AVS for patient  education given to patient -Discussed importance of 150 minutes of physical activity weekly, eat two servings of fish weekly, eat one serving of tree nuts ( cashews, pistachios, pecans, almonds.Marland Kitchen) every other day, eat 6 servings of fruit/vegetables daily and drink plenty of water and avoid sweet beverages.

## 2018-09-09 NOTE — Patient Instructions (Addendum)
-  Please get your fasting cholesterol panel completed in 3 months after you work diet and exercise.  General recommendations: 150 minutes of physical activity weekly, eat two servings of fish weekly, eat one serving of tree nuts ( cashews, pistachios, pecans, almonds.Marland Kitchen) every other day, eat 6 servings of fruit/vegetables daily and drink plenty of water and avoid sweet beverages.   Bad cholesterol, also called low-density lipoprotein (LDL), carries cholesterol and other fats that your liver makes to your body tissue. If it builds up in blood vessels, LDL can cause heart disease and other health problems. Your LDL level should be below 100.  Eat: Eat 20 to 30 grams of soluble fiber every day. Foods such as fruits and vegetables, whole grains, beans, peas, nuts, and seeds can help lower LDL. Avoid: Saturated fats (Dairy foods - such as butter, cream, ghee, regular-fat milk and cheese. Meat - such as fatty cuts of beef, pork and lamb, processed meats like salami, sausages and the skin on chicken. Lard., fatty snack foods, cakes, biscuits, pies and deep fried foods) Avoid smoking    Kegel Exercises Kegel exercises help strengthen the muscles that support the rectum, vagina, small intestine, bladder, and uterus. Doing Kegel exercises can help:  Improve bladder and bowel control.  Improve sexual response.  Reduce problems and discomfort during pregnancy.  Kegel exercises involve squeezing your pelvic floor muscles, which are the same muscles you squeeze when you try to stop the flow of urine. The exercises can be done while sitting, standing, or lying down, but it is best to vary your position. Phase 1 exercises 1. Squeeze your pelvic floor muscles tight. You should feel a tight lift in your rectal area. If you are a female, you should also feel a tightness in your vaginal area. Keep your stomach, buttocks, and legs relaxed. 2. Hold the muscles tight for up to 10 seconds. 3. Relax your  muscles. Repeat this exercise 50 times a day or as many times as told by your health care provider. Continue to do this exercise for at least 4-6 weeks or for as long as told by your health care provider. This information is not intended to replace advice given to you by your health care provider. Make sure you discuss any questions you have with your health care provider. Document Released: 10/16/2012 Document Revised: 06/24/2016 Document Reviewed: 09/19/2015 Elsevier Interactive Patient Education  [employer].

## 2018-09-13 ENCOUNTER — Ambulatory Visit: Payer: Self-pay | Admitting: Obstetrics & Gynecology

## 2018-09-13 ENCOUNTER — Ambulatory Visit (INDEPENDENT_AMBULATORY_CARE_PROVIDER_SITE_OTHER): Payer: Managed Care, Other (non HMO) | Admitting: Obstetrics & Gynecology

## 2018-09-13 ENCOUNTER — Encounter: Payer: Self-pay | Admitting: Obstetrics & Gynecology

## 2018-09-13 VITALS — BP 100/60 | Ht 69.0 in | Wt 204.0 lb

## 2018-09-13 DIAGNOSIS — Z01419 Encounter for gynecological examination (general) (routine) without abnormal findings: Secondary | ICD-10-CM | POA: Diagnosis not present

## 2018-09-13 DIAGNOSIS — Z1239 Encounter for other screening for malignant neoplasm of breast: Secondary | ICD-10-CM

## 2018-09-13 DIAGNOSIS — Z0000 Encounter for general adult medical examination without abnormal findings: Secondary | ICD-10-CM

## 2018-09-13 DIAGNOSIS — Z124 Encounter for screening for malignant neoplasm of cervix: Secondary | ICD-10-CM

## 2018-09-13 NOTE — Progress Notes (Signed)
HPI:      Barbara [name] is a 52 y.o. G1P1001 who LMP was in the past, she presents today for her annual examination.  The patient has no complaints today. The patient is sexually active. Herlast pap: approximate date 2016 and was normal and last mammogram: approximate date 2019 and was normal.  The patient does perform self breast exams.  There is no notable family history of breast or ovarian cancer in her family. The patient is not taking hormone replacement therapy. Patient denies post-menopausal vaginal bleeding.   The patient has regular exercise: yes. The patient denies current symptoms of depression.    GYN Hx: Last Colonoscopy:6 years ago. Normal.  Last DEXA: never ago.    PMHx: Past Medical History:  Diagnosis Date  . Acid reflux   . Adnexal mass   . Bipolar disorder ([place])   . Depression   . Depression   . Herpes   . Irregular menstrual bleeding   . Melanoma [facility])    Past Surgical History:  Procedure Laterality Date  . CERVICAL BIOPSY     Dr. Kenton Kingfisher with [facility]  . COLONOSCOPY    . COLONOSCOPY    . DILATION AND CURETTAGE OF UTERUS    . MELANOMA EXCISION    . TONSILLECTOMY     Family History  Problem Relation Age of Onset  . Heart attack Father   . Cancer Father        melanoma  . Cancer Maternal Aunt        breast  . Cancer Paternal Aunt        breast   Social History   Tobacco Use  . Smoking status: Former Smoker    Types: Cigarettes    Last attempt to quit: 02/12/1990    Years since quitting: 28.6  . Smokeless tobacco: Never Used  Substance Use Topics  . Alcohol use: No    Alcohol/week: 0.0 standard drinks  . Drug use: No    Current Outpatient Medications:  .  diazepam (VALIUM) 5 MG tablet, Take 1 tablet (5 mg total) by mouth every 8 (eight) hours as needed for muscle spasms., Disp: 20 tablet, Rfl: 0 .  divalproex (DEPAKOTE ER) 500 MG 24 hr tablet, Take 2 tablets (1,000 mg total) by mouth daily., Disp: 180 tablet, Rfl: 1 .  hydrOXYzine  (ATARAX/VISTARIL) 25 MG tablet, Take 1 tablet (25 mg total) by mouth at bedtime as needed (sleep)., Disp: 90 tablet, Rfl: 1 .  ibuprofen (ADVIL,MOTRIN) 800 MG tablet, Take 1 tablet (800 mg total) by mouth every 8 (eight) hours as needed., Disp: 30 tablet, Rfl: 0 .  Lurasidone HCl 60 MG TABS, Take 1 tablet (60 mg total) by mouth daily with supper., Disp: 90 tablet, Rfl: 1 .  neomycin-polymyxin b-dexamethasone (MAXITROL) 3.5-10000-0.1 SUSP, INSTILL 1 DROP INTO LEFT EYE 4 TIMES A DAY, Disp: , Rfl: 0 .  pimecrolimus (ELIDEL) 1 % cream, Apply topically., Disp: , Rfl:  .  traZODone (DESYREL) 100 MG tablet, Take 1.5 tablets (150 mg total) by mouth at bedtime., Disp: 135 tablet, Rfl: 1 Allergies: Penicillins; Sulfa antibiotics; and Prednisone  Review of Systems  Constitutional: Positive for malaise/fatigue. Negative for chills and fever.  HENT: Negative for congestion, sinus pain and sore throat.   Eyes: Negative for blurred vision and pain.  Respiratory: Negative for cough and wheezing.   Cardiovascular: Negative for chest pain and leg swelling.  Gastrointestinal: Negative for abdominal pain, constipation, diarrhea, heartburn, nausea and vomiting.  Genitourinary:  Negative for dysuria, frequency, hematuria and urgency.  Musculoskeletal: Negative for back pain, joint pain, myalgias and neck pain.  Skin: Positive for rash. Negative for itching.  Neurological: Negative for dizziness, tremors and weakness.  Endo/Heme/Allergies: Does not bruise/bleed easily.  Psychiatric/Behavioral: Negative for depression. The patient is not nervous/anxious and does not have insomnia.    Objective: BP 100/60   Ht 5\' 9"  (1.753 m)   Wt 204 lb (92.5 kg)   BMI 30.13 kg/m   Filed Weights   09/13/18 1100  Weight: 204 lb (92.5 kg)   Body mass index is 30.13 kg/m. Physical Exam  Constitutional: She is oriented to person, place, and time. She appears well-developed and well-nourished. No distress.  Genitourinary:  Rectum normal, vagina normal and uterus normal. Pelvic exam was performed with patient supine. There is no rash or lesion on the right labia. There is no rash or lesion on the left labia. Vagina exhibits no lesion. No bleeding in the vagina. Right adnexum does not display mass and does not display tenderness. Left adnexum does not display mass and does not display tenderness. Cervix does not exhibit motion tenderness, lesion, friability or polyp.   Uterus is mobile and midaxial. Uterus is not enlarged or exhibiting a mass.  HENT:  Head: Normocephalic and atraumatic. Head is without laceration.  Right Ear: Hearing normal.  Left Ear: Hearing normal.  Nose: No epistaxis.  No foreign bodies.  Mouth/Throat: Uvula is midline, oropharynx is clear and moist and mucous membranes are normal.  Eyes: Pupils are equal, round, and reactive to light.  Neck: Normal range of motion. Neck supple. No thyromegaly present.  Cardiovascular: Normal rate and regular rhythm. Exam reveals no gallop and no friction rub.  No murmur heard. Pulmonary/Chest: Effort normal and breath sounds normal. No respiratory distress. She has no wheezes. Right breast exhibits no mass, no skin change and no tenderness. Left breast exhibits no mass, no skin change and no tenderness.  Abdominal: Soft. Bowel sounds are normal. She exhibits no distension. There is no tenderness. There is no rebound.  Musculoskeletal: Normal range of motion.  Neurological: She is alert and oriented to person, place, and time. No cranial nerve deficit.  Skin: Skin is warm and dry. Rash noted.  Psychiatric: She has a normal mood and affect. Judgment normal.  Vitals reviewed.  Assessment: Annual Exam 1. Annual physical exam   2. Screening for cervical cancer   3. Screening for breast cancer    Plan:            1.  Cervical Screening-  Pap smear done today  2. Breast screening- Exam annually and mammogram scheduled  3. Colonoscopy every 10 years,  Hemoccult testing after age 11  4. Labs managed by PCP  5. Counseling for hormonal therapy: none.    6. Obesity, will adjust diet and consider Weight Watchers  7. Flu shot declined     F/U  Return in about 1 year (around 09/14/2019) for Annual.  Barnett Applebaum, MD, [facility], [facility] 09/13/2018  11:32 AM

## 2018-09-13 NOTE — Patient Instructions (Signed)
PAP every three years Mammogram every year, due after February 20, 2019    Call [phone] to schedule at [facility] Colonoscopy soon as scheduled Labs yearly (with PCP)

## 2018-09-14 LAB — BASIC METABOLIC PANEL
BUN/Creatinine Ratio: 26 — ABNORMAL HIGH (ref 9–23)
BUN: 28 mg/dL — ABNORMAL HIGH (ref 6–24)
CALCIUM: 9.3 mg/dL (ref 8.7–10.2)
CHLORIDE: 102 mmol/L (ref 96–106)
CO2: 25 mmol/L (ref 20–29)
Creatinine, Ser: 1.06 mg/dL — ABNORMAL HIGH (ref 0.57–1.00)
GFR calc non Af Amer: 60 mL/min/{1.73_m2} (ref >59)
GFR, EST AFRICAN AMERICAN: 69 mL/min/{1.73_m2} (ref >59)
GLUCOSE: 70 mg/dL (ref 65–99)
POTASSIUM: 4.9 mmol/L (ref 3.5–5.2)
Sodium: 142 mmol/L (ref 134–144)

## 2018-09-14 LAB — VITAMIN D 25 HYDROXY (VIT D DEFICIENCY, FRACTURES): Vit D, 25-Hydroxy: 35.5 ng/mL (ref 30.0–100.0)

## 2018-09-19 LAB — IGP, APTIMA HPV: HPV Aptima: NEGATIVE

## 2018-10-08 ENCOUNTER — Ambulatory Visit: Payer: 59 | Admitting: Psychiatry

## 2018-10-31 LAB — HM COLONOSCOPY

## 2019-01-19 ENCOUNTER — Other Ambulatory Visit: Payer: Self-pay | Admitting: Psychiatry

## 2019-02-04 ENCOUNTER — Encounter: Payer: Self-pay | Admitting: Psychiatry

## 2019-02-04 ENCOUNTER — Ambulatory Visit (INDEPENDENT_AMBULATORY_CARE_PROVIDER_SITE_OTHER): Payer: 59 | Admitting: Psychiatry

## 2019-02-04 ENCOUNTER — Other Ambulatory Visit: Payer: Self-pay

## 2019-02-04 DIAGNOSIS — F5105 Insomnia due to other mental disorder: Secondary | ICD-10-CM | POA: Diagnosis not present

## 2019-02-04 DIAGNOSIS — F319 Bipolar disorder, unspecified: Secondary | ICD-10-CM | POA: Diagnosis not present

## 2019-02-04 MED ORDER — LURASIDONE HCL 60 MG PO TABS: ORAL_TABLET | ORAL | 1 refills | Status: DC

## 2019-02-04 MED ORDER — HYDROXYZINE HCL 25 MG PO TABS: 25.0000 mg | ORAL_TABLET | Freq: Every evening | ORAL | 1 refills | Status: DC | PRN

## 2019-02-04 MED ORDER — DIVALPROEX SODIUM ER 500 MG PO TB24: 1000.0000 mg | ORAL_TABLET | Freq: Every day | ORAL | 1 refills | Status: DC

## 2019-02-04 NOTE — Progress Notes (Signed)
Virtual Visit via Telephone Note  I connected with Barbara [name] on 02/05/19 at  3:30 PM EDT by telephone and verified that I am speaking with the correct person using two identifiers.   I discussed the limitations, risks, security and privacy concerns of performing an evaluation and management service by telephone and the availability of in person appointments. I also discussed with the patient that there may be a patient responsible charge related to this service. The patient expressed understanding and agreed to proceed.   I discussed the assessment and treatment plan with the patient. The patient was provided an opportunity to ask questions and all were answered. The patient agreed with the plan and demonstrated an understanding of the instructions.   The patient was advised to call back or seek an in-person evaluation if the symptoms worsen or if the condition fails to improve as anticipated.  I provided 15 minutes of non-face-to-face time during this encounter.    [place] MD  OP Progress Note  02/04/2019 3:59 PM Barbara [name]  MRN:  [id number]  Chief Complaint: ' Follow up." Chief Complaint    Follow-up     HPI: Barbara [name] is a 53 year old Caucasian female, lives in [place], employed, divorced, has a history of bipolar disorder , was evaluated by phone.  Patient today reports she is anxious about the corona virus outbreak.  She however reports she is trying to stay calm by making use of her social support system.  Her parents live close to her and she has been visiting them.  She reports she is also happy that she can still work at [employer].  They have offered her a letter that will help her to drive to her work if they order more restrictions statewide.  Patient reports her mood symptoms are stable on the current medication regimen.  She is currently taking Latuda with her meals.  She however has noticed some recent headaches and wonders whether it is due to the [place].  She  also does not know if it is her seasonal allergies.  Discussed with her to monitor her symptoms closely.  Patient denies any suicidality, homicidality or perceptual disturbances.  She reports her sleep and appetite is fair.  Patient denies any other concerns today. Visit Diagnosis:    ICD-10-CM   1. Bipolar I disorder, most recent episode depressed with anxious distress (HCC) F31.9 divalproex (DEPAKOTE ER) 500 MG 24 hr tablet    Lurasidone HCl (LATUDA) 60 MG TABS  2. Insomnia due to mental disorder F51.05 hydrOXYzine (ATARAX/VISTARIL) 25 MG tablet    Past Psychiatric History: Reviewed past psychiatric history from my progress note on 02/12/2018.  Past Medical History:  Past Medical History:  Diagnosis Date  . Acid reflux   . Adnexal mass   . Bipolar disorder ([place])   . Depression   . Depression   . Herpes   . Irregular menstrual bleeding   . Melanoma [facility])     Past Surgical History:  Procedure Laterality Date  . CERVICAL BIOPSY     Dr. Kenton Kingfisher with [facility]  . COLONOSCOPY    . COLONOSCOPY    . DILATION AND CURETTAGE OF UTERUS    . MELANOMA EXCISION    . TONSILLECTOMY      Family Psychiatric History: Reviewed family psychiatric history from my progress note on 02/12/2018  Family History:  Family History  Problem Relation Age of Onset  . Heart attack Father   . Cancer Father  melanoma  . Cancer Maternal Aunt        breast  . Cancer Paternal Aunt        breast    Social History: I have reviewed social history from my progress note on 02/12/2018. Social History   Socioeconomic History  . Marital status: Divorced    Spouse name: Not on file  . Number of children: 1  . Years of education: Not on file  . Highest education level: Associate degree: occupational, Hotel manager, or vocational program  Occupational History    Comment: full time  Social Needs  . Financial resource strain: Not hard at all  . Food insecurity:    Worry: Never true    Inability:  Never true  . Transportation needs:    Medical: No    Non-medical: No  Tobacco Use  . Smoking status: Former Smoker    Types: Cigarettes    Last attempt to quit: 02/12/1990    Years since quitting: 29.0  . Smokeless tobacco: Never Used  Substance and Sexual Activity  . Alcohol use: No    Alcohol/week: 0.0 standard drinks  . Drug use: No  . Sexual activity: Not Currently  Lifestyle  . Physical activity:    Days per week: 0 days    Minutes per session: 0 min  . Stress: Rather much  Relationships  . Social connections:    Talks on phone: More than three times a week    Gets together: More than three times a week    Attends religious service: More than 4 times per year    Active member of club or organization: No    Attends meetings of clubs or organizations: Never    Relationship status: Divorced  Other Topics Concern  . Not on file  Social History Narrative  . Not on file    Allergies:  Allergies  Allergen Reactions  . Penicillins Hives and Other (See Comments)    delusional  . Sulfa Antibiotics Hives and Other (See Comments)    dellusional  . Prednisone Other (See Comments)    Heart racing    Metabolic Disorder Labs: Lab Results  Component Value Date   HGBA1C 5.4 05/17/2018   Lab Results  Component Value Date   PROLACTIN 11.6 05/17/2018   Lab Results  Component Value Date   CHOL 234 (H) 05/17/2018   TRIG 133 05/17/2018   HDL 52 05/17/2018   CHOLHDL 4.3 10/14/2015   LDLCALC 155 (H) 05/17/2018   LDLCALC 132 (H) 10/14/2015   Lab Results  Component Value Date   TSH 4.030 08/17/2017   TSH 2.770 10/14/2015    Therapeutic Level Labs: No results found for: LITHIUM Lab Results  Component Value Date   VALPROATE 87 05/17/2018   VALPROATE 63 08/17/2017   No components found for:  CBMZ  Current Medications: Current Outpatient Medications  Medication Sig Dispense Refill  . acetaminophen (TYLENOL) 650 MG CR tablet Take by mouth.    Marland Kitchen azelastine  (ASTELIN) 0.1 % nasal spray PLACE 1 SPRAY INTO BOTH NOSTRILS 2 (TWO) TIMES DAILY    . Cholecalciferol (VITAMIN D-1000 MAX ST) 25 MCG (1000 UT) tablet Take by mouth.    . diazepam (VALIUM) 5 MG tablet Take 1 tablet (5 mg total) by mouth every 8 (eight) hours as needed for muscle spasms. 20 tablet 0  . divalproex (DEPAKOTE ER) 500 MG 24 hr tablet Take 2 tablets (1,000 mg total) by mouth daily. 180 tablet 1  . hydrOXYzine (ATARAX/VISTARIL) 25  MG tablet Take 1 tablet (25 mg total) by mouth at bedtime as needed (sleep). 90 tablet 1  . ibuprofen (ADVIL,MOTRIN) 800 MG tablet Take 1 tablet (800 mg total) by mouth every 8 (eight) hours as needed. 30 tablet 0  . Lurasidone HCl (LATUDA) 60 MG TABS TAKE 1 TABLET BY MOUTH  DAILY WITH SUPPER 90 tablet 1  . neomycin-polymyxin b-dexamethasone (MAXITROL) 3.5-10000-0.1 SUSP INSTILL 1 DROP INTO LEFT EYE 4 TIMES A DAY  0  . pimecrolimus (ELIDEL) 1 % cream Apply topically.    . traZODone (DESYREL) 100 MG tablet TAKE 1 AND 1/2 TABLETS BY  MOUTH AT BEDTIME 135 tablet 1   No current facility-administered medications for this visit.      Musculoskeletal: Strength & Muscle Tone: unable to assess Gait & Station: unable to assess Patient leans: unable to assess  Psychiatric Specialty Exam: Review of Systems  Psychiatric/Behavioral: The patient is nervous/anxious.   All other systems reviewed and are negative.   There were no vitals taken for this visit.There is no height or weight on file to calculate BMI.  General Appearance: unable to assess  Eye Contact:  unable to assess  Speech:  Clear and Coherent  Volume:  Normal  Mood:  Anxious  Affect:  unable to assess  Thought Process:  Goal Directed and Descriptions of Associations: Intact  Orientation:  Full (Time, Place, and Person)  Thought Content: Logical   Suicidal Thoughts:  No  Homicidal Thoughts:  No  Memory:  Immediate;   Fair Recent;   Fair Remote;   Fair  Judgement:  Fair  Insight:  Fair   Psychomotor Activity:  Likely normal  Concentration:  Concentration: Fair and Attention Span: Fair  Recall:  [employer] of Knowledge: Fair  Language: Fair  Akathisia:  No  Handed:  Right  AIMS (if indicated): denies tremors, rigidity,stiffness  Assets:  Communication Skills Desire for Improvement Housing Social Support  ADL's:  Intact  Cognition: WNL  Sleep:  fair   Screenings: AIMS     Office Visit from 02/12/2018 in [place] Total Score  0    PHQ2-9     [facility] Visit from 09/09/2018 in [facility] Office Visit from 01/11/2018 in [facility] Office Visit from 08/16/2017 in [facility] Office Visit from 12/14/2016 in [facility] Office Visit from 09/05/2016 in [facility]  PHQ-2 Total Score  0  0  0  0  0  PHQ-9 Total Score  2  -  -  -  -       Assessment and Plan: Tenise is a 53 yr old Caucasian female, employed, divorced, lives in [place], has a history of bipolar disorder, evaluated today by phone.  Patient is anxious about current corona virus outbreak however otherwise is doing well.  She has good social support system and is employed.  Discussed plan as noted below.  Plan Bipolar type I-stable Depakote ER 1000 mg at bedtime Depakote level-therapeutic-87 on 05/17/2018. Continue Latuda 60 mg p.o. daily with meals.  For insomnia-stable Trazodone 150 mg p.o. nightly She will continue sleep hygiene techniques. She also has hydroxyzine as needed at bedtime.  Follow-up in clinic in 3 to 4 months or sooner if needed.  I have spent atleast 15 minutes non face to face with patient today. More than 50 % of the time was spent for psychoeducation and supportive psychotherapy and care coordination.  This note  was generated in part or whole with voice recognition software. Voice recognition is usually quite accurate but there are transcription  errors that can and very often do occur. I apologize for any typographical errors that were not detected and corrected.        Ursula Alert, MD 02/05/2019, 11:47 AM

## 2019-02-05 ENCOUNTER — Encounter: Payer: Self-pay | Admitting: Psychiatry

## 2019-04-24 ENCOUNTER — Telehealth: Payer: Self-pay

## 2019-04-24 DIAGNOSIS — F99 Mental disorder, not otherwise specified: Secondary | ICD-10-CM

## 2019-04-24 DIAGNOSIS — F5105 Insomnia due to other mental disorder: Secondary | ICD-10-CM

## 2019-04-24 NOTE — Telephone Encounter (Signed)
Patient called requesting a refill on her Trazodone 100mg  stating she is out of medication and her insurance only covers a 90 day supply. She is using [facility] mail service. Patient has scheduled appointment on 05/09/19. Please review and advise. Thank you.

## 2019-04-25 MED ORDER — TRAZODONE HCL 100 MG PO TABS: ORAL_TABLET | ORAL | 1 refills | Status: DC

## 2019-04-25 NOTE — Telephone Encounter (Signed)
Sent trazodone to pharmacy.

## 2019-05-07 ENCOUNTER — Ambulatory Visit: Payer: 59 | Admitting: Psychiatry

## 2019-05-09 ENCOUNTER — Other Ambulatory Visit: Payer: Self-pay

## 2019-05-09 ENCOUNTER — Ambulatory Visit: Payer: 59 | Admitting: Psychiatry

## 2019-07-01 ENCOUNTER — Ambulatory Visit: Payer: Self-pay | Admitting: Psychiatry

## 2019-07-21 ENCOUNTER — Other Ambulatory Visit: Payer: Self-pay | Admitting: Psychiatry

## 2019-07-21 DIAGNOSIS — F319 Bipolar disorder, unspecified: Secondary | ICD-10-CM

## 2019-07-28 ENCOUNTER — Telehealth: Payer: Self-pay

## 2019-07-28 ENCOUNTER — Encounter: Payer: Self-pay | Admitting: Psychiatry

## 2019-07-28 ENCOUNTER — Ambulatory Visit (INDEPENDENT_AMBULATORY_CARE_PROVIDER_SITE_OTHER): Payer: 59 | Admitting: Psychiatry

## 2019-07-28 ENCOUNTER — Other Ambulatory Visit: Payer: Self-pay

## 2019-07-28 DIAGNOSIS — F3175 Bipolar disorder, in partial remission, most recent episode depressed: Secondary | ICD-10-CM

## 2019-07-28 DIAGNOSIS — Z79899 Other long term (current) drug therapy: Secondary | ICD-10-CM | POA: Diagnosis not present

## 2019-07-28 DIAGNOSIS — F319 Bipolar disorder, unspecified: Secondary | ICD-10-CM | POA: Insufficient documentation

## 2019-07-28 DIAGNOSIS — F5105 Insomnia due to other mental disorder: Secondary | ICD-10-CM

## 2019-07-28 NOTE — Telephone Encounter (Signed)
lab work orders mailed out  

## 2019-07-28 NOTE — Progress Notes (Signed)
Virtual Visit via Telephone Note  I connected with Barbara [name] on 07/28/19 at  3:45 PM EDT by telephone and verified that I am speaking with the correct person using two identifiers.   I discussed the limitations, risks, security and privacy concerns of performing an evaluation and management service by telephone and the availability of in person appointments. I also discussed with the patient that there may be a patient responsible charge related to this service. The patient expressed understanding and agreed to proceed.    I discussed the assessment and treatment plan with the patient. The patient was provided an opportunity to ask questions and all were answered. The patient agreed with the plan and demonstrated an understanding of the instructions.   The patient was advised to call back or seek an in-person evaluation if the symptoms worsen or if the condition fails to improve as anticipated.  [place] MD OP Progress Note  07/28/2019 4:55 PM Barbara [name]  MRN:  [id number]  Chief Complaint:  Chief Complaint    Follow-up     HPI: Bentlie is a 53 year old Caucasian female, lives in [place], employed, divorced, has a history of bipolar disorder, was evaluated by phone today.  Patient today reports she continues to work at [employer].  She reports work is going well.  She has been making use of precautions as recommended.  She reports her mood symptoms are stable on the current medication regimen.  He is compliant on Latuda, Depakote and trazodone.  She denies any significant side effects to medications.  Discussed with patient that she has not had Depakote level as well as other metabolic panel done in a while.  She agrees to get it done.  We will print out lab slip and mailed to patient today.  She denies any other concerns today.   Visit Diagnosis:    ICD-10-CM   1. Bipolar disorder, in partial remission, most recent episode depressed ([place])  F31.75   2. Insomnia due to mental  disorder  F51.05   3. High risk medication use  Z79.899 Lipid panel    Prolactin    Valproic acid level    CBC With Differential    Comprehensive metabolic panel    Hemoglobin A1C    Past Psychiatric History: I have reviewed past psychiatric history from my progress note on 02/12/2018.  Past Medical History:  Past Medical History:  Diagnosis Date  . Acid reflux   . Adnexal mass   . Bipolar disorder ([place])   . Depression   . Depression   . Herpes   . Irregular menstrual bleeding   . Melanoma [facility])     Past Surgical History:  Procedure Laterality Date  . CERVICAL BIOPSY     Dr. Kenton Kingfisher with [facility]  . COLONOSCOPY    . COLONOSCOPY    . DILATION AND CURETTAGE OF UTERUS    . MELANOMA EXCISION    . TONSILLECTOMY      Family Psychiatric History: I have reviewed family psychiatric history from my progress note on 02/12/2018  Family History:  Family History  Problem Relation Age of Onset  . Heart attack Father   . Cancer Father        melanoma  . Cancer Maternal Aunt        breast  . Cancer Paternal Aunt        breast    Social History: I have reviewed social history from my progress note on 02/12/2018 Social History   Socioeconomic  History  . Marital status: Divorced    Spouse name: Not on file  . Number of children: 1  . Years of education: Not on file  . Highest education level: Associate degree: occupational, Hotel manager, or vocational program  Occupational History    Comment: full time  Social Needs  . Financial resource strain: Not hard at all  . Food insecurity    Worry: Never true    Inability: Never true  . Transportation needs    Medical: No    Non-medical: No  Tobacco Use  . Smoking status: Former Smoker    Types: Cigarettes    Quit date: 02/12/1990    Years since quitting: 29.4  . Smokeless tobacco: Never Used  Substance and Sexual Activity  . Alcohol use: No    Alcohol/week: 0.0 standard drinks  . Drug use: No  . Sexual activity: Not  Currently  Lifestyle  . Physical activity    Days per week: 0 days    Minutes per session: 0 min  . Stress: Rather much  Relationships  . Social connections    Talks on phone: More than three times a week    Gets together: More than three times a week    Attends religious service: More than 4 times per year    Active member of club or organization: No    Attends meetings of clubs or organizations: Never    Relationship status: Divorced  Other Topics Concern  . Not on file  Social History Narrative  . Not on file    Allergies:  Allergies  Allergen Reactions  . Penicillins Hives and Other (See Comments)    delusional  . Sulfa Antibiotics Hives and Other (See Comments)    dellusional  . Prednisone Other (See Comments)    Heart racing    Metabolic Disorder Labs: Lab Results  Component Value Date   HGBA1C 5.4 05/17/2018   Lab Results  Component Value Date   PROLACTIN 11.6 05/17/2018   Lab Results  Component Value Date   CHOL 234 (H) 05/17/2018   TRIG 133 05/17/2018   HDL 52 05/17/2018   CHOLHDL 4.3 10/14/2015   LDLCALC 155 (H) 05/17/2018   LDLCALC 132 (H) 10/14/2015   Lab Results  Component Value Date   TSH 4.030 08/17/2017   TSH 2.770 10/14/2015    Therapeutic Level Labs: No results found for: LITHIUM Lab Results  Component Value Date   VALPROATE 87 05/17/2018   VALPROATE 63 08/17/2017   No components found for:  CBMZ  Current Medications: Current Outpatient Medications  Medication Sig Dispense Refill  . acetaminophen (TYLENOL) 650 MG CR tablet Take by mouth.    Marland Kitchen azelastine (ASTELIN) 0.1 % nasal spray PLACE 1 SPRAY INTO BOTH NOSTRILS 2 (TWO) TIMES DAILY    . Cholecalciferol (VITAMIN D-1000 MAX ST) 25 MCG (1000 UT) tablet Take by mouth.    . diazepam (VALIUM) 5 MG tablet Take 1 tablet (5 mg total) by mouth every 8 (eight) hours as needed for muscle spasms. 20 tablet 0  . divalproex (DEPAKOTE ER) 500 MG 24 hr tablet TAKE 2 TABLETS BY MOUTH  DAILY 180  tablet 3  . hydrOXYzine (ATARAX/VISTARIL) 25 MG tablet Take 1 tablet (25 mg total) by mouth at bedtime as needed (sleep). 90 tablet 1  . ibuprofen (ADVIL,MOTRIN) 800 MG tablet Take 1 tablet (800 mg total) by mouth every 8 (eight) hours as needed. 30 tablet 0  . LATUDA 60 MG TABS TAKE 1 TABLET BY  MOUTH  DAILY WITH SUPPER 90 tablet 3  . neomycin-polymyxin b-dexamethasone (MAXITROL) 3.5-10000-0.1 SUSP INSTILL 1 DROP INTO LEFT EYE 4 TIMES A DAY  0  . pimecrolimus (ELIDEL) 1 % cream Apply topically.    . traZODone (DESYREL) 100 MG tablet TAKE 1 AND 1/2 TABLETS BY  MOUTH AT BEDTIME 135 tablet 1   No current facility-administered medications for this visit.      Musculoskeletal: Strength & Muscle Tone: UTA Gait & Station: Reports as wnl Patient leans: N/A  Psychiatric Specialty Exam: Review of Systems  Psychiatric/Behavioral: Negative for depression, hallucinations, substance abuse and suicidal ideas. The patient is not nervous/anxious and does not have insomnia.   All other systems reviewed and are negative.   There were no vitals taken for this visit.There is no height or weight on file to calculate BMI.  General Appearance: UTA  Eye Contact:  UTA  Speech:  Clear and Coherent  Volume:  Normal  Mood:  Euthymic  Affect:  UTA  Thought Process:  Goal Directed and Descriptions of Associations: Intact  Orientation:  Full (Time, Place, and Person)  Thought Content: Logical   Suicidal Thoughts:  No  Homicidal Thoughts:  No  Memory:  Immediate;   Fair Recent;   Fair Remote;   Fair  Judgement:  Fair  Insight:  Fair  Psychomotor Activity:  UTA  Concentration:  Concentration: Fair and Attention Span: Fair  Recall:  [employer] of Knowledge: Fair  Language: Fair  Akathisia:  No  Handed:  Right  AIMS (if indicated): Denies tremors, rigidity  Assets:  Communication Skills Desire for Improvement Housing Social Support  ADL's:  Intact  Cognition: WNL  Sleep:  Fair   Screenings: AIMS      Office Visit from 02/12/2018 in [facility] Total Score  0    PHQ2-9     Office Visit from 09/09/2018 in [facility] Office Visit from 01/11/2018 in [facility] Office Visit from 08/16/2017 in [facility] Office Visit from 12/14/2016 in [facility] Office Visit from 09/05/2016 in [facility]  PHQ-2 Total Score  0  0  0  0  0  PHQ-9 Total Score  2  -  -  -  -       Assessment and Plan: Azriella is a 53 year old Caucasian female, employed, divorced, lives in [place], has a history of bipolar disorder, evaluated today by phone.  Patient today reports she is currently making progress.  Plan as noted below.  Plan Bipolar disorder type I-in remission Depakote ER 1000 mg at bedtime Depakote level-therapeutic-87 on 05/17/2018 Continue Latuda 60 mg p.o. daily with meals.  Insomnia-stable Trazodone 150 mg p.o. nightly She will continue sleep hygiene techniques She also has hydroxyzine as needed at bedtime  We will order the following labs- Depakote level, CMP, lipid panel, hemoglobin A1c, prolactin.  Will mail it to her.  Follow-up in clinic in 3 to 4 months or sooner if needed.  December 22 at 4 PM  I have spent atleast 15 minutes non  face to face with patient today. More than 50 % of the time was spent for psychoeducation and supportive psychotherapy and care coordination. This note was generated in part or whole with voice recognition software. Voice recognition is usually quite accurate but there are transcription errors that can and very often do occur. I apologize for any typographical errors that were not detected and corrected.  Ursula Alert, MD 07/28/2019, 4:55 PM

## 2019-08-26 ENCOUNTER — Telehealth: Payer: Self-pay | Admitting: Psychiatry

## 2019-08-26 ENCOUNTER — Other Ambulatory Visit: Payer: Self-pay | Admitting: Obstetrics & Gynecology

## 2019-08-26 DIAGNOSIS — Z1231 Encounter for screening mammogram for malignant neoplasm of breast: Secondary | ICD-10-CM

## 2019-08-26 LAB — LIPID PANEL
Chol/HDL Ratio: 4 ratio (ref 0.0–4.4)
Cholesterol, Total: 222 mg/dL — ABNORMAL HIGH (ref 100–199)
HDL: 56 mg/dL (ref >39)
LDL Chol Calc (NIH): 141 mg/dL — ABNORMAL HIGH (ref 0–99)
Triglycerides: 143 mg/dL (ref 0–149)
VLDL Cholesterol Cal: 25 mg/dL (ref 5–40)

## 2019-08-26 LAB — CBC WITH DIFFERENTIAL
Basophils Absolute: 0.1 10*3/uL (ref 0.0–0.2)
Basos: 1 %
EOS (ABSOLUTE): 0.2 10*3/uL (ref 0.0–0.4)
Eos: 3 %
Hematocrit: 43.8 % (ref 34.0–46.6)
Hemoglobin: 14.3 g/dL (ref 11.1–15.9)
Immature Grans (Abs): 0 10*3/uL (ref 0.0–0.1)
Immature Granulocytes: 1 %
Lymphocytes Absolute: 3.1 10*3/uL (ref 0.7–3.1)
Lymphs: 37 %
MCH: 29.2 pg (ref 26.6–33.0)
MCHC: 32.6 g/dL (ref 31.5–35.7)
MCV: 90 fL (ref 79–97)
Monocytes Absolute: 0.6 10*3/uL (ref 0.1–0.9)
Monocytes: 7 %
Neutrophils Absolute: 4.5 10*3/uL (ref 1.4–7.0)
Neutrophils: 51 %
RBC: 4.89 x10E6/uL (ref 3.77–5.28)
RDW: 13.5 % (ref 11.7–15.4)
WBC: 8.5 10*3/uL (ref 3.4–10.8)

## 2019-08-26 LAB — COMPREHENSIVE METABOLIC PANEL
ALT: 11 IU/L (ref 0–32)
AST: 14 IU/L (ref 0–40)
Albumin/Globulin Ratio: 1.9 (ref 1.2–2.2)
Albumin: 4.6 g/dL (ref 3.8–4.9)
Alkaline Phosphatase: 44 IU/L (ref 39–117)
BUN/Creatinine Ratio: 21 (ref 9–23)
BUN: 23 mg/dL (ref 6–24)
Bilirubin Total: 0.2 mg/dL (ref 0.0–1.2)
CO2: 25 mmol/L (ref 20–29)
Calcium: 9.5 mg/dL (ref 8.7–10.2)
Chloride: 101 mmol/L (ref 96–106)
Creatinine, Ser: 1.09 mg/dL — ABNORMAL HIGH (ref 0.57–1.00)
GFR calc Af Amer: 66 mL/min/{1.73_m2} (ref >59)
GFR calc non Af Amer: 57 mL/min/{1.73_m2} — ABNORMAL LOW (ref >59)
Globulin, Total: 2.4 g/dL (ref 1.5–4.5)
Glucose: 82 mg/dL (ref 65–99)
Potassium: 4.4 mmol/L (ref 3.5–5.2)
Sodium: 140 mmol/L (ref 134–144)
Total Protein: 7 g/dL (ref 6.0–8.5)

## 2019-08-26 LAB — VALPROIC ACID LEVEL: Valproic Acid Lvl: 84 ug/mL (ref 50–100)

## 2019-08-26 LAB — HEMOGLOBIN A1C
Est. average glucose Bld gHb Est-mCnc: 105 mg/dL
Hgb A1c MFr Bld: 5.3 % (ref 4.8–5.6)

## 2019-08-26 LAB — PROLACTIN: Prolactin: 8.2 ng/mL (ref 4.8–23.3)

## 2019-08-26 NOTE — Telephone Encounter (Signed)
labwork faxed and confirmed.

## 2019-08-26 NOTE — Telephone Encounter (Signed)
Called patient to discuss labs.left message. Creatinine slightly elevated .  Lipid panel - abnormal. depakote level - 84- therapeutic Prolactin - wbl - 8.2 hba1c- wnl - 5.3 AST/ALT- WNL CBC - wnl

## 2019-08-28 ENCOUNTER — Telehealth: Payer: Self-pay

## 2019-08-28 NOTE — Telephone Encounter (Signed)
pcp was faxed and confirmed a copy of the labwork. pt was called and told to follow up with pcp and that pcp was faxed information.

## 2019-08-29 ENCOUNTER — Other Ambulatory Visit: Payer: Self-pay | Admitting: Psychiatry

## 2019-08-29 DIAGNOSIS — F5105 Insomnia due to other mental disorder: Secondary | ICD-10-CM

## 2019-08-29 DIAGNOSIS — F99 Mental disorder, not otherwise specified: Secondary | ICD-10-CM

## 2019-09-12 ENCOUNTER — Encounter: Payer: Self-pay | Admitting: Family Medicine

## 2019-10-08 ENCOUNTER — Ambulatory Visit: Payer: Self-pay | Admitting: Family Medicine

## 2019-10-15 ENCOUNTER — Ambulatory Visit (INDEPENDENT_AMBULATORY_CARE_PROVIDER_SITE_OTHER): Payer: Managed Care, Other (non HMO) | Admitting: Family Medicine

## 2019-10-15 ENCOUNTER — Encounter: Payer: Self-pay | Admitting: Obstetrics & Gynecology

## 2019-10-15 ENCOUNTER — Ambulatory Visit (INDEPENDENT_AMBULATORY_CARE_PROVIDER_SITE_OTHER): Payer: Managed Care, Other (non HMO) | Admitting: Obstetrics & Gynecology

## 2019-10-15 ENCOUNTER — Encounter: Payer: Self-pay | Admitting: Family Medicine

## 2019-10-15 ENCOUNTER — Other Ambulatory Visit: Payer: Self-pay

## 2019-10-15 VITALS — BP 102/62 | Ht 69.0 in | Wt 201.0 lb

## 2019-10-15 DIAGNOSIS — E782 Mixed hyperlipidemia: Secondary | ICD-10-CM | POA: Diagnosis not present

## 2019-10-15 DIAGNOSIS — F319 Bipolar disorder, unspecified: Secondary | ICD-10-CM | POA: Diagnosis not present

## 2019-10-15 DIAGNOSIS — N289 Disorder of kidney and ureter, unspecified: Secondary | ICD-10-CM

## 2019-10-15 DIAGNOSIS — Z01419 Encounter for gynecological examination (general) (routine) without abnormal findings: Secondary | ICD-10-CM | POA: Diagnosis not present

## 2019-10-15 DIAGNOSIS — Q828 Other specified congenital malformations of skin: Secondary | ICD-10-CM | POA: Diagnosis not present

## 2019-10-15 DIAGNOSIS — F5105 Insomnia due to other mental disorder: Secondary | ICD-10-CM

## 2019-10-15 NOTE — Patient Instructions (Signed)
PAP every three years Mammogram every year Colonoscopy every 10 years Labs yearly (with PCP)   

## 2019-10-15 NOTE — Progress Notes (Signed)
HPI:      [name] is a 53 y.o. G1P1001 who LMP was in the past, she presents today for her annual examination.  The patient has no complaints today. The patient is not currently sexually active. Herlast pap: approximate date 2019 and was normal and last mammogram: approximate date 2019 and was normal.  The patient does perform self breast exams.  There is no notable family history of breast or ovarian cancer in her family. The patient is not taking hormone replacement therapy. Patient denies post-menopausal vaginal bleeding.   The patient has regular exercise: yes. The patient denies current symptoms of depression.    GYN Hx: Last Colonoscopy:1 year ago. Normal.  Last DEXA: never ago.    PMHx: Past Medical History:  Diagnosis Date  . Acid reflux   . Adnexal mass   . Bipolar disorder ([place])   . Depression   . Depression   . Herpes   . Irregular menstrual bleeding   . Melanoma [facility])    Past Surgical History:  Procedure Laterality Date  . CERVICAL BIOPSY     Dr. Kenton Kingfisher with [facility]  . COLONOSCOPY    . COLONOSCOPY    . DILATION AND CURETTAGE OF UTERUS    . MELANOMA EXCISION    . TONSILLECTOMY     Family History  Problem Relation Age of Onset  . Heart attack Father   . Cancer Father        melanoma  . Cancer Maternal Aunt        breast  . Cancer Paternal Aunt        breast   Social History   Tobacco Use  . Smoking status: Former Smoker    Types: Cigarettes    Quit date: 02/12/1990    Years since quitting: 29.6  . Smokeless tobacco: Never Used  Substance Use Topics  . Alcohol use: No    Alcohol/week: 0.0 standard drinks  . Drug use: No    Current Outpatient Medications:  .  acetaminophen (TYLENOL) 650 MG CR tablet, Take by mouth., Disp: , Rfl:  .  azelastine (ASTELIN) 0.1 % nasal spray, PLACE 1 SPRAY INTO BOTH NOSTRILS 2 (TWO) TIMES DAILY, Disp: , Rfl:  .  Cholecalciferol (VITAMIN D-1000 MAX ST) 25 MCG (1000 UT) tablet, Take by mouth., Disp: , Rfl:   .  diazepam (VALIUM) 5 MG tablet, Take 1 tablet (5 mg total) by mouth every 8 (eight) hours as needed for muscle spasms., Disp: 20 tablet, Rfl: 0 .  divalproex (DEPAKOTE ER) 500 MG 24 hr tablet, TAKE 2 TABLETS BY MOUTH  DAILY, Disp: 180 tablet, Rfl: 3 .  hydrOXYzine (ATARAX/VISTARIL) 25 MG tablet, Take 1 tablet (25 mg total) by mouth at bedtime as needed (sleep)., Disp: 90 tablet, Rfl: 1 .  ibuprofen (ADVIL,MOTRIN) 800 MG tablet, Take 1 tablet (800 mg total) by mouth every 8 (eight) hours as needed., Disp: 30 tablet, Rfl: 0 .  LATUDA 60 MG TABS, TAKE 1 TABLET BY MOUTH  DAILY WITH SUPPER, Disp: 90 tablet, Rfl: 3 .  neomycin-polymyxin b-dexamethasone (MAXITROL) 3.5-10000-0.1 SUSP, INSTILL 1 DROP INTO LEFT EYE 4 TIMES A DAY, Disp: , Rfl: 0 .  pimecrolimus (ELIDEL) 1 % cream, Apply topically., Disp: , Rfl:  .  traZODone (DESYREL) 100 MG tablet, TAKE 1 AND 1/2 TABLETS BY  MOUTH AT BEDTIME, Disp: 135 tablet, Rfl: 3 Allergies: Penicillins, Sulfa antibiotics, and Prednisone  Review of Systems  Constitutional: Negative for chills, fever and malaise/fatigue.  HENT: Negative for congestion, sinus pain and sore throat.   Eyes: Negative for blurred vision and pain.  Respiratory: Negative for cough and wheezing.   Cardiovascular: Negative for chest pain and leg swelling.  Gastrointestinal: Negative for abdominal pain, constipation, diarrhea, heartburn, nausea and vomiting.  Genitourinary: Negative for dysuria, frequency, hematuria and urgency.  Musculoskeletal: Negative for back pain, joint pain, myalgias and neck pain.  Skin: Negative for itching and rash.  Neurological: Negative for dizziness, tremors and weakness.  Endo/Heme/Allergies: Does not bruise/bleed easily.  Psychiatric/Behavioral: Negative for depression. The patient is not nervous/anxious and does not have insomnia.     Objective: BP 102/62   Ht 5\' 9"  (1.753 m)   Wt 201 lb (91.2 kg)   BMI 29.68 kg/m   Filed Weights   10/15/19 1521   Weight: 201 lb (91.2 kg)   Body mass index is 29.68 kg/m. Physical Exam Constitutional:      General: She is not in acute distress.    Appearance: She is well-developed.  Genitourinary:     Pelvic exam was performed with patient supine.     Vagina, uterus and rectum normal.     No lesions in the vagina.     No vaginal bleeding.     No cervical motion tenderness, friability, lesion or polyp.     Uterus is mobile.     Uterus is not enlarged.     No uterine mass detected.    Uterus is midaxial.     No right or left adnexal mass present.     Right adnexa not tender.     Left adnexa not tender.  HENT:     Head: Normocephalic and atraumatic. No laceration.     Right Ear: Hearing normal.     Left Ear: Hearing normal.     Mouth/Throat:     Pharynx: Uvula midline.  Eyes:     Pupils: Pupils are equal, round, and reactive to light.  Neck:     Musculoskeletal: Normal range of motion and neck supple.     Thyroid: No thyromegaly.  Cardiovascular:     Rate and Rhythm: Normal rate and regular rhythm.     Heart sounds: No murmur. No friction rub. No gallop.   Pulmonary:     Effort: Pulmonary effort is normal. No respiratory distress.     Breath sounds: Normal breath sounds. No wheezing.  Chest:     Breasts:        Right: No mass, skin change or tenderness.        Left: No mass, skin change or tenderness.  Abdominal:     General: Bowel sounds are normal. There is no distension.     Palpations: Abdomen is soft.     Tenderness: There is no abdominal tenderness. There is no rebound.  Musculoskeletal: Normal range of motion.  Neurological:     Mental Status: She is alert and oriented to person, place, and time.     Cranial Nerves: No cranial nerve deficit.  Skin:    General: Skin is warm and dry.  Psychiatric:        Judgment: Judgment normal.  Vitals signs reviewed.     Assessment: Annual Exam 1. Women's annual routine gynecological examination     Plan:            1.   Cervical Screening-  Pap smear schedule reviewed with patient  2. Breast screening- Exam annually and mammogram scheduled  3. Colonoscopy every 10 years, Hemoccult  testing after age 43  4. Labs managed by PCP  5. Counseling for hormonal therapy: none              6. FRAX - FRAX score for assessing the 10 year probability for fracture calculated and discussed today.  Based on age and score today, DEXA is not currently scheduled.    F/U  Return in about 1 year (around 10/14/2020) for Annual.  Barnett Applebaum, MD, [facility], [facility] 10/15/2019  3:56 PM

## 2019-10-15 NOTE — Patient Instructions (Signed)
Your LDL is above normal.  The LDL is the "lousy" or bad cholesterol. Over time and in combination with inflammation and other factors, this contributes to plaque which in turn may lead to stroke and/or heart attack down the road.  Sometimes high LDL is primarily genetic, and people might be eating all the right foods but still have high numbers.  Other times, there is room for improvement in one's diet and eating healthier can bring this number down and potentially reduce one's risk of heart attack and/or stroke.  Your LDL level should be below 100. If you have diabetes or a possible heart problem, your LDL should be below 70.  Some strategies to focus on to help improve your LDL levels:  - Eat 20 to 30 grams of fiber every day.  - Eat Foods such as fruits and vegetables, whole grains, beans, peas, nuts, and seeds can help lower LDL. - Avoid Saturated fats - Dairy foods - such as butter, cream, ghee, regular-fat milk and cheese. Meat - such as fatty cuts of beef, pork and lamb, processed meats like salami, sausages and the skin on chicken. Lard., fatty snack foods, cakes, biscuits, pies and deep fried foods) - Avoid smoking

## 2019-10-15 NOTE — Progress Notes (Signed)
Name: Barbara [name]   MRN: [id number]    DOB: [birth year]   Date:10/15/2019       Progress Note  Subjective  Chief Complaint  Chief Complaint  Patient presents with  . Follow-up    from [place]    I connected with  Barbara [name] on 10/15/19 at 10:00 AM EST by telephone and verified that I am speaking with the correct person using two identifiers.   I discussed the limitations, risks, security and privacy concerns of performing an evaluation and management service by telephone and the availability of in person appointments. Staff also discussed with the patient that there may be a patient responsible charge related to this service. Patient Location: Home Provider Location: Home Office Additional Individuals present: None  HPI  Pt presents for follow up - she is new to me today, was a former patient of Dr. Delight [name], has not been seen in over a year.  Bipolar Type 1 and Insomnia: Seeing psychiatry - Dr. Shea [name].  She is taking Latuda, trazodone, and depakote, and PRN hydroxyzine.  She feels like her symptoms are well controlled at this time.  She is not going to counseling at this time.  Denies SI/HI, auditory/visual/tactile hallucinations.  Energy levels have been slowly improving.  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  She is seeing Dr. Evorn [name] and feels like things are under good control.  She does have history melanoma - has regular skin surveys.   HLD: Had labs done 08/25/2019.  LDL was 141 - she does not want to take statin therapy at this time.  She is not current exercising - willing to start and would like to start by walking indoors at work. Discussed lifestyle management in detail including significant dietary changes.  Renal insufficiency: Recent labs show stable kidney function. Avoiding NSAIDs.   HM: She is going to Dr. Kenton [name] today for Pap. Mammogram and annual CPE are scheduled. Flu shot UTD.   Will call to check with insurance about shingles shot and we will administer if covered at our office.  Patient Active Problem List   Diagnosis Date Noted  . Bipolar I disorder, most recent episode depressed with anxious distress ([place]) 07/28/2019  . High risk medication use 07/28/2019  . Renal insufficiency 01/11/2018  . 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 Family hx of melanoma 01/11/2018  . Hx of malignant melanoma 01/11/2018  . Hyperplastic colon polyp 09/27/2017  . Medication monitoring encounter 09/05/2016  . Therapeutic drug monitoring 09/05/2016  . Encounter for therapeutic drug level monitoring 09/05/2016  . Chest pain 02/09/2016  . Hyperlipidemia 02/09/2016  . Obesity 02/09/2016  . 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 Bipolar 1 disorder, depressed, partial remission ([place]) 09/27/2015  . Encounter for cholesteral screening for cardiovascular disease 09/27/2015  . Other fatigue 09/27/2015    Social History   Tobacco Use  . Smoking status: Former Smoker    Types: Cigarettes    Quit date: 02/12/1990    Years since quitting: 29.6  . Smokeless tobacco: Never Used  Substance Use Topics  . Alcohol use: No    Alcohol/week: 0.0 standard drinks     Current Outpatient Medications:  .  acetaminophen (TYLENOL) 650 MG CR tablet, Take by mouth., Disp: , Rfl:  .  azelastine (ASTELIN) 0.1 % nasal spray, PLACE 1 SPRAY INTO BOTH NOSTRILS 2 (TWO) TIMES DAILY, Disp: , Rfl:  .  Cholecalciferol (VITAMIN D-1000 MAX  ST) 25 MCG (1000 UT) tablet, Take by mouth., Disp: , Rfl:  .  diazepam (VALIUM) 5 MG tablet, Take 1 tablet (5 mg total) by mouth every 8 (eight) hours as needed for muscle spasms., Disp: 20 tablet, Rfl: 0 .  divalproex (DEPAKOTE ER) 500 MG 24 hr tablet, TAKE 2 TABLETS BY MOUTH  DAILY, Disp: 180 tablet, Rfl: 3 .  hydrOXYzine (ATARAX/VISTARIL) 25 MG tablet, Take 1 tablet (25 mg total) by mouth at bedtime as needed (sleep)., Disp: 90 tablet, Rfl: 1 .  ibuprofen (ADVIL,MOTRIN) 800 MG  tablet, Take 1 tablet (800 mg total) by mouth every 8 (eight) hours as needed., Disp: 30 tablet, Rfl: 0 .  LATUDA 60 MG TABS, TAKE 1 TABLET BY MOUTH  DAILY WITH SUPPER, Disp: 90 tablet, Rfl: 3 .  neomycin-polymyxin b-dexamethasone (MAXITROL) 3.5-10000-0.1 SUSP, INSTILL 1 DROP INTO LEFT EYE 4 TIMES A DAY, Disp: , Rfl: 0 .  pimecrolimus (ELIDEL) 1 % cream, Apply topically., Disp: , Rfl:  .  traZODone (DESYREL) 100 MG tablet, TAKE 1 AND 1/2 TABLETS BY  MOUTH AT BEDTIME, Disp: 135 tablet, Rfl: 3  Allergies  Allergen Reactions  . Penicillins Hives and Other (See Comments)    delusional  . Sulfa Antibiotics Hives and Other (See Comments)    dellusional  . Prednisone Other (See Comments)    Heart racing    I personally reviewed active problem list, medication list, allergies, health maintenance, lab results with the patient/caregiver today.  ROS  Ten systems reviewed and is negative except as mentioned in HPI  Objective  Virtual encounter, vitals not obtained.  There is no height or weight on file to calculate BMI.  Nursing Note and Vital Signs reviewed.  Physical Exam  Pulmonary/Chest: Effort normal. No respiratory distress. Speaking in complete sentences Neurological: Pt is alert and oriented to person, place, and time. Speech is normal Psychiatric: Patient has a normal mood and affect. behavior is normal. Judgment and thought content normal.    No results found for this or any previous visit (from the past 72 hour(s)).  Assessment & Plan  1. Bipolar I disorder, most recent episode depressed with anxious distress ([place]) - Continue follow up with psychiatry  2. Mixed hyperlipidemia - Declines statin therapy, discussed intesnsive lifestyle management.  3. Renal insufficiency - Stable, avoid NSAIDs, drink plenty of water.  4. Darier disease - Keep follow up with Dr. Evorn [name] with Derm.  5. Insomnia due to mental disorder - Fatigue is improved, she is feeling well overall  and sleeping better with current regimen  -Red flags and when to present for emergency care or RTC including fever >101.18F, chest pain, shortness of breath, new/worsening/un-resolving symptoms, reviewed with patient at time of visit. Follow up and care instructions discussed and provided in AVS. - I discussed the assessment and treatment plan with the patient. The patient was provided an opportunity to ask questions and all were answered. The patient agreed with the plan and demonstrated an understanding of the instructions.  - The patient was advised to call back or seek an in-person evaluation if the symptoms worsen or if the condition fails to improve as anticipated.  I provided 18 minutes of non-face-to-face time during this encounter.  Hubbard Hartshorn, FNP

## 2019-11-03 ENCOUNTER — Encounter: Payer: Self-pay | Admitting: Family Medicine

## 2019-11-03 ENCOUNTER — Ambulatory Visit: Payer: Self-pay

## 2019-11-04 ENCOUNTER — Encounter: Payer: Self-pay | Admitting: Psychiatry

## 2019-11-04 ENCOUNTER — Ambulatory Visit (INDEPENDENT_AMBULATORY_CARE_PROVIDER_SITE_OTHER): Payer: 59 | Admitting: Psychiatry

## 2019-11-04 ENCOUNTER — Other Ambulatory Visit: Payer: Self-pay

## 2019-11-04 ENCOUNTER — Ambulatory Visit
Admission: RE | Admit: 2019-11-04 | Discharge: 2019-11-04 | Disposition: A | Discharge status: Home / Self Care | Payer: Managed Care, Other (non HMO) | Source: Ambulatory Visit | Attending: Obstetrics & Gynecology | Admitting: Obstetrics & Gynecology

## 2019-11-04 DIAGNOSIS — F5105 Insomnia due to other mental disorder: Secondary | ICD-10-CM

## 2019-11-04 DIAGNOSIS — Z1231 Encounter for screening mammogram for malignant neoplasm of breast: Secondary | ICD-10-CM | POA: Insufficient documentation

## 2019-11-04 DIAGNOSIS — F3176 Bipolar disorder, in full remission, most recent episode depressed: Secondary | ICD-10-CM | POA: Diagnosis not present

## 2019-11-04 NOTE — Progress Notes (Signed)
Virtual Visit via Video Note  I connected with Barbara [name] on 11/04/19 at  4:00 PM EST by a video enabled telemedicine application and verified that I am speaking with the correct person using two identifiers.   I discussed the limitations of evaluation and management by telemedicine and the availability of in person appointments. The patient expressed understanding and agreed to proceed.    I discussed the assessment and treatment plan with the patient. The patient was provided an opportunity to ask questions and all were answered. The patient agreed with the plan and demonstrated an understanding of the instructions.   The patient was advised to call back or seek an in-person evaluation if the symptoms worsen or if the condition fails to improve as anticipated.  [place] MD OP Progress Note  11/04/2019 5:46 PM Barbara [name]  MRN:  [id number]  Chief Complaint:  Chief Complaint    Follow-up     HPI: Barbara [name] is a 53 year old Caucasian female, lives in [place], employed, divorced, has a history of bipolar disorder was evaluated by telemedicine today.  A video call was initiated however due to connection problem it had to be changed to a phone call.  Patient reports she is currently doing well on the current medication regimen.  She reports mood symptoms are stable.  Patient denies any sleep problems.  She is compliant on her medications as prescribed and denies side effects.  Patient was able to get all her labs done.  Reviewed and discussed labs with her.  Patient advised to go to her primary care provider for follow-up and repeat labs as needed.  Patient reports she is currently taking a break from work for the holidays.  She plans to spend her time with her family.  Patient denies any suicidality, homicidality or perceptual disturbances.  Patient denies any other concerns today. Visit Diagnosis:    ICD-10-CM   1. Bipolar disorder, in full remission, most recent episode  depressed ([name])  F31.76   2. Insomnia due to mental disorder  F51.05     Past Psychiatric History: I have reviewed past psychiatric history from my progress note on 02/12/2018.  Past Medical History:  Past Medical History:  Diagnosis Date  . Acid reflux   . Adnexal mass   . Bipolar disorder ([place])   . Depression   . Depression   . Herpes   . Irregular menstrual bleeding   . Melanoma [facility])     Past Surgical History:  Procedure Laterality Date  . CERVICAL BIOPSY     Dr. Kenton Kingfisher with [facility]  . COLONOSCOPY    . COLONOSCOPY    . DILATION AND CURETTAGE OF UTERUS    . MELANOMA EXCISION    . TONSILLECTOMY      Family Psychiatric History: I have reviewed family psychiatric history from my progress note on 02/12/2018.  Family History:  Family History  Problem Relation Age of Onset  . Heart attack Father   . Cancer Father        melanoma  . Cancer Maternal Aunt        breast  . Cancer Paternal Aunt        breast    Social History: I have reviewed social history from my progress note on 02/12/2018. Social History   Socioeconomic History  . Marital status: Divorced    Spouse name: Not on file  . Number of children: 1  . Years of education: Not on file  . Highest education level:  Associate degree: occupational, technical, or vocational program  Occupational History    Comment: full time  Tobacco Use  . Smoking status: Former Smoker    Types: Cigarettes    Quit date: 02/12/1990    Years since quitting: 29.7  . Smokeless tobacco: Never Used  Substance and Sexual Activity  . Alcohol use: No    Alcohol/week: 0.0 standard drinks  . Drug use: No  . Sexual activity: Not Currently  Other Topics Concern  . Not on file  Social History Narrative  . Not on file   Social Determinants of Health   Financial Resource Strain:   . Difficulty of Paying Living Expenses: Not on file  Food Insecurity:   . Worried About Charity fundraiser in the Last Year: Not on file  . Ran  Out of Food in the Last Year: Not on file  Transportation Needs:   . Lack of Transportation (Medical): Not on file  . Lack of Transportation (Non-Medical): Not on file  Physical Activity:   . Days of Exercise per Week: Not on file  . Minutes of Exercise per Session: Not on file  Stress:   . Feeling of Stress : Not on file  Social Connections:   . Frequency of Communication with Friends and Family: Not on file  . Frequency of Social Gatherings with Friends and Family: Not on file  . Attends Religious Services: Not on file  . Active Member of Clubs or Organizations: Not on file  . Attends Archivist Meetings: Not on file  . Marital Status: Not on file    Allergies:  Allergies  Allergen Reactions  . Penicillins Hives and Other (See Comments)    delusional  . Sulfa Antibiotics Hives and Other (See Comments)    dellusional  . Prednisone Other (See Comments)    Heart racing    Metabolic Disorder Labs: Lab Results  Component Value Date   HGBA1C 5.3 08/25/2019   Lab Results  Component Value Date   PROLACTIN 8.2 08/25/2019   PROLACTIN 11.6 05/17/2018   Lab Results  Component Value Date   CHOL 222 (H) 08/25/2019   TRIG 143 08/25/2019   HDL 56 08/25/2019   CHOLHDL 4.0 08/25/2019   LDLCALC 141 (H) 08/25/2019   LDLCALC 155 (H) 05/17/2018   Lab Results  Component Value Date   TSH 4.030 08/17/2017   TSH 2.770 10/14/2015    Therapeutic Level Labs: No results found for: LITHIUM Lab Results  Component Value Date   VALPROATE 84 08/25/2019   VALPROATE 87 05/17/2018   No components found for:  CBMZ  Current Medications: Current Outpatient Medications  Medication Sig Dispense Refill  . acetaminophen (TYLENOL) 650 MG CR tablet Take by mouth.    Marland Kitchen azelastine (ASTELIN) 0.1 % nasal spray PLACE 1 SPRAY INTO BOTH NOSTRILS 2 (TWO) TIMES DAILY    . Cholecalciferol (VITAMIN D-1000 MAX ST) 25 MCG (1000 UT) tablet Take by mouth.    . diazepam (VALIUM) 5 MG tablet Take 1  tablet (5 mg total) by mouth every 8 (eight) hours as needed for muscle spasms. 20 tablet 0  . divalproex (DEPAKOTE ER) 500 MG 24 hr tablet TAKE 2 TABLETS BY MOUTH  DAILY 180 tablet 3  . hydrOXYzine (ATARAX/VISTARIL) 25 MG tablet Take 1 tablet (25 mg total) by mouth at bedtime as needed (sleep). 90 tablet 1  . ibuprofen (ADVIL,MOTRIN) 800 MG tablet Take 1 tablet (800 mg total) by mouth every 8 (eight) hours as needed.  30 tablet 0  . LATUDA 60 MG TABS TAKE 1 TABLET BY MOUTH  DAILY WITH SUPPER 90 tablet 3  . neomycin-polymyxin b-dexamethasone (MAXITROL) 3.5-10000-0.1 SUSP INSTILL 1 DROP INTO LEFT EYE 4 TIMES A DAY  0  . pimecrolimus (ELIDEL) 1 % cream Apply topically.    . traZODone (DESYREL) 100 MG tablet TAKE 1 AND 1/2 TABLETS BY  MOUTH AT BEDTIME 135 tablet 3   No current facility-administered medications for this visit.     Musculoskeletal: Strength & Muscle Tone: UTA Gait & Station: Reports as WNL Patient leans: N/A  Psychiatric Specialty Exam: Review of Systems  Psychiatric/Behavioral: Negative for agitation, behavioral problems, confusion, decreased concentration, dysphoric mood, hallucinations, self-injury, sleep disturbance and suicidal ideas. The patient is not nervous/anxious and is not hyperactive.   All other systems reviewed and are negative.   There were no vitals taken for this visit.There is no height or weight on file to calculate BMI.  General Appearance: UTA  Eye Contact:  UTA  Speech:  Clear and Coherent  Volume:  Normal  Mood:  Euthymic  Affect:  UTA  Thought Process:  Goal Directed and Descriptions of Associations: Intact  Orientation:  Full (Time, Place, and Person)  Thought Content: Logical   Suicidal Thoughts:  No  Homicidal Thoughts:  No  Memory:  Immediate;   Fair Recent;   Fair Remote;   Fair  Judgement:  Fair  Insight:  Good  Psychomotor Activity:  Normal  Concentration:  Concentration: Fair and Attention Span: Fair  Recall:  [employer] of  Knowledge: Fair  Language: Fair  Akathisia:  No  Handed:  Right  AIMS (if indicated):Denies tremors, rigidity  Assets:  Communication Skills Desire for Improvement Housing Social Support  ADL's:  Intact  Cognition: WNL  Sleep:  Fair   Screenings: AIMS     Office Visit from 02/12/2018 in [facility] Total Score  0    PHQ2-9     Office Visit from 10/15/2019 in [facility] Office Visit from 09/09/2018 in [facility] Office Visit from 01/11/2018 in [facility] Office Visit from 08/16/2017 in [facility] Office Visit from 12/14/2016 in [facility]  PHQ-2 Total Score  0  0  0  0  0  PHQ-9 Total Score  0  2  --  --  --       Assessment and Plan: Barbara [name] is a 53 year old Caucasian female, employed, divorced, lives in [place], has a history of bipolar disorder, was evaluated by telemedicine today.  Patient is currently doing well on the current medication regimen.  Plan as noted below.  Plan Bipolar disorder in remission Depakote ER 1000 mg at bedtime Continue Latuda 60 mg p.o. daily with meals I have reviewed the following labs-discussed with patient. Depakote level-08/25/2019 -84-therapeutic Lipid panel-08/25/2019-total cholesterol-elevated at 222, LDL elevated at 141 Prolactin-08/25/2019-8.2 within normal limits CBC with differential-08/25/2019-within normal limits CMP-08/25/2019-within normal limits except for creatinine elevated at 1.09, GFR-low at 57-improved from a year ago.  Patient however advised to follow-up with PMD. Hemoglobin A1c-5.3 Patient was advised to follow-up with primary care provider when labs reported-patient reports she already has an appointment.   Insomnia-stable Trazodone 150 mg p.o. nightly She also has hydroxyzine as needed at bedtime She will continue sleep hygiene techniques  Follow-up in clinic in 3 to 4 months or  sooner if needed.  March 30 at 4 PM  I have spent  atleast 15 minutes non face to face with patient today. More than 50 % of the time was spent for psychoeducation and supportive psychotherapy and care coordination. This note was generated in part or whole with voice recognition software. Voice recognition is usually quite accurate but there are transcription errors that can and very often do occur. I apologize for any typographical errors that were not detected and corrected.       Ursula Alert, MD 11/04/2019, 5:46 PM

## 2019-11-05 ENCOUNTER — Ambulatory Visit: Payer: Managed Care, Other (non HMO) | Admitting: Obstetrics & Gynecology

## 2019-11-11 ENCOUNTER — Ambulatory Visit (INDEPENDENT_AMBULATORY_CARE_PROVIDER_SITE_OTHER): Payer: Managed Care, Other (non HMO) | Admitting: Family Medicine

## 2019-11-11 ENCOUNTER — Encounter: Payer: Self-pay | Admitting: Family Medicine

## 2019-11-11 ENCOUNTER — Other Ambulatory Visit: Payer: Self-pay

## 2019-11-11 VITALS — BP 120/70 | HR 64 | Temp 97.9°F | Resp 16 | Ht 69.0 in | Wt 200.4 lb

## 2019-11-11 DIAGNOSIS — Z114 Encounter for screening for human immunodeficiency virus [HIV]: Secondary | ICD-10-CM | POA: Diagnosis not present

## 2019-11-11 DIAGNOSIS — Z0000 Encounter for general adult medical examination without abnormal findings: Secondary | ICD-10-CM | POA: Diagnosis not present

## 2019-11-11 DIAGNOSIS — Z1159 Encounter for screening for other viral diseases: Secondary | ICD-10-CM

## 2019-11-11 DIAGNOSIS — E559 Vitamin D deficiency, unspecified: Secondary | ICD-10-CM | POA: Diagnosis not present

## 2019-11-11 DIAGNOSIS — Z1389 Encounter for screening for other disorder: Secondary | ICD-10-CM

## 2019-11-11 NOTE — Addendum Note (Signed)
Addended by: Hubbard Hartshorn on: 11/11/2019 01:20 PM   Modules accepted: Orders

## 2019-11-11 NOTE — Progress Notes (Addendum)
Name: Barbara [name]   MRN: [id number]    DOB: [birth year]   Date:11/11/2019       Progress Note  Subjective  Chief Complaint  Chief Complaint  Patient presents with  . Annual Exam    HPI  Patient presents for annual CPE.  Diet: Has been eating poorly around the holidays, planning to get back on track Exercise: Some walking, otherwise not very active   USPSTF grade A and B recommendations    Office Visit from 11/11/2019 in [facility]  AUDIT-C Score  0    - No use  Depression: Phq 9 is  negative Depression screen [facility] 2/9 11/11/2019 10/15/2019 09/09/2018 09/09/2018 01/11/2018  Decreased Interest 0 0 0 0 0  Down, Depressed, Hopeless 0 0 0 0 0  PHQ - 2 Score 0 0 0 0 0  Altered sleeping 0 0 1 - -  Tired, decreased energy 0 0 0 - -  Change in appetite 0 0 1 - -  Feeling bad or failure about yourself  0 0 0 - -  Trouble concentrating 0 0 0 - -  Moving slowly or fidgety/restless 0 0 0 - -  Suicidal thoughts 0 0 0 - -  PHQ-9 Score 0 0 2 - -  Difficult doing work/chores Not difficult at all Not difficult at all Not difficult at all - -   Hypertension: BP Readings from Last 3 Encounters:  11/11/19 120/70  10/15/19 102/62  09/13/18 100/60   Obesity: Wt Readings from Last 3 Encounters:  11/11/19 200 lb 6.4 oz (90.9 kg)  10/15/19 201 lb (91.2 kg)  09/13/18 204 lb (92.5 kg)   BMI Readings from Last 3 Encounters:  11/11/19 29.59 kg/m  10/15/19 29.68 kg/m  09/13/18 30.13 kg/m    Hep C Screening: Needs testing STD testing and prevention (HIV/chl/gon/syphilis): No new partners; HIV test to be ordered for health maintenance; declines additional testing Intimate partner violence: No concerns Pain during Intercourse: No pain with intercourse - not sexually active.  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 - BRCA gene screening: Maternal and Paternal Aunts both had breast cancer. Mother and sister have not had any breast cancer.  She notes her paternal cousin had genetic testing done and it was negative for familial breast cancer, however patient does not know about maternal side.   Osteoporosis: Discussed high calcium and vitamin D supplementation, weight bearing exercise.  No known family history. Taking 1000IU vitamin D  Cervical cancer screening: due in 2022.  Skin cancer: Discussed monitoring for atypical lesions.  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  Colorectal cancer:  Colonoscopy UTD - due in 2024. Denies family or personal history of colorectal cancer, no changes in BM's - no blood in stool, dark and tarry stool, mucus in stool, or constipation/diarrhea. Lung cancer:  Quit smoking 29 years ago. Low Dose CT Chest recommended if Age 70-80 years, 30 pack-year currently smoking OR have quit w/in 15years. Patient does not qualify.   ECG: Denies chest pain, shortness of breath, palpitations  Advanced Care Planning: A voluntary discussion about advance care planning including  the explanation and discussion of advance directives.  Discussed health care proxy and Living will, and the patient was able to identify a health care proxy as Barbara [name].  Patient does not have a living will at present time. If patient does have living will, I have requested they bring this to the clinic to be scanned in to their chart.  Lipids: Lab Results  Component Value Date   CHOL 222 (H) 08/25/2019   CHOL 234 (H) 05/17/2018   CHOL 210 (H) 10/14/2015   Lab Results  Component Value Date   HDL 56 08/25/2019   HDL 52 05/17/2018   HDL 49 10/14/2015   Lab Results  Component  Value Date   LDLCALC 141 (H) 08/25/2019   LDLCALC 155 (H) 05/17/2018   LDLCALC 132 (H) 10/14/2015   Lab Results  Component Value Date   TRIG 143 08/25/2019   TRIG 133 05/17/2018   TRIG 144 10/14/2015   Lab Results  Component Value Date   CHOLHDL 4.0 08/25/2019   CHOLHDL 4.3 10/14/2015   No results found for: LDLDIRECT  Glucose: Glucose  Date Value Ref Range Status  08/25/2019 82 65 - 99 mg/dL Final  09/13/2018 70 65 - 99 mg/dL Final  01/11/2018 66 65 - 99 mg/dL Final   Glucose, Bld  Date Value Ref Range Status  08/31/2018 86 70 - 99 mg/dL Final  09/06/2017 87 65 - 99 mg/dL Final  02/04/2016 92 65 - 99 mg/dL Final    Patient Active Problem List   Diagnosis Date Noted  . Bipolar I disorder, most recent episode depressed with anxious distress ([place]) 07/28/2019  . High risk medication use 07/28/2019  . Renal insufficiency 01/11/2018  . 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 Family hx of melanoma 01/11/2018  . Hx of malignant melanoma 01/11/2018  . Hyperplastic colon polyp 09/27/2017  . Medication monitoring encounter 09/05/2016  . Hyperlipidemia 02/09/2016  . Obesity 02/09/2016  . 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 Bipolar disorder, in partial remission, most recent episode depressed ([place]) 09/27/2015  . Other fatigue 09/27/2015    Past Surgical History:  Procedure Laterality Date  . CERVICAL BIOPSY     Dr. Kenton [name] with [facility]  . COLONOSCOPY    . COLONOSCOPY    . DILATION AND CURETTAGE OF UTERUS    . MELANOMA EXCISION    . TONSILLECTOMY      Family History  Problem Relation Age of Onset  . Heart attack Father   . Cancer Father        melanoma  . Cancer Maternal Aunt        breast  . Cancer Paternal Aunt        breast    Social History   Socioeconomic History  . Marital status: Divorced    Spouse name: Not on file  . Number of children: 1  . Years of education: Not on file  . Highest education level: Associate degree: occupational,  Hotel manager, or vocational program  Occupational History    Comment: full time  Tobacco Use  . Smoking status: Former Smoker    Types: Cigarettes    Quit date: 02/12/1990    Years since quitting: 29.7  . Smokeless tobacco: Never Used  Substance and Sexual Activity  . Alcohol use: No    Alcohol/week: 0.0 standard drinks  . Drug use: No  . Sexual activity: Not Currently  Other Topics Concern  . Not on file  Social History Narrative  .  Not on file   Social Determinants of Health   Financial Resource Strain:   . Difficulty of Paying Living Expenses: Not on file  Food Insecurity:   . Worried About Charity fundraiser in the Last Year: Not on file  . Ran Out of Food in the Last Year: Not on file  Transportation Needs:   . Lack of Transportation (Medical): Not on file  . Lack of Transportation (Non-Medical): Not on file  Physical Activity:   . Days of Exercise per Week: Not on file  . Minutes of Exercise per Session: Not on file  Stress:   . Feeling of Stress : Not on file  Social Connections:   . Frequency of Communication with Friends and Family: Not on file  . Frequency of Social Gatherings with Friends and Family: Not on file  . Attends Religious Services: Not on file  . Active Member of Clubs or Organizations: Not on file  . Attends Archivist Meetings: Not on file  . Marital Status: Not on file  Intimate Partner Violence:   . Fear of Current or Ex-Partner: Not on file  . Emotionally Abused: Not on file  . Physically Abused: Not on file  . Sexually Abused: Not on file     Current Outpatient Medications:  .  acetaminophen (TYLENOL) 650 MG CR tablet, Take by mouth., Disp: , Rfl:  .  azelastine (ASTELIN) 0.1 % nasal spray, PLACE 1 SPRAY INTO BOTH NOSTRILS 2 (TWO) TIMES DAILY, Disp: , Rfl:  .  Cholecalciferol (VITAMIN D-1000 MAX ST) 25 MCG (1000 UT) tablet, Take by mouth., Disp: , Rfl:  .  diazepam (VALIUM) 5 MG tablet, Take 1 tablet (5 mg total) by mouth every 8  (eight) hours as needed for muscle spasms., Disp: 20 tablet, Rfl: 0 .  divalproex (DEPAKOTE ER) 500 MG 24 hr tablet, TAKE 2 TABLETS BY MOUTH  DAILY, Disp: 180 tablet, Rfl: 3 .  hydrOXYzine (ATARAX/VISTARIL) 25 MG tablet, Take 1 tablet (25 mg total) by mouth at bedtime as needed (sleep)., Disp: 90 tablet, Rfl: 1 .  ibuprofen (ADVIL,MOTRIN) 800 MG tablet, Take 1 tablet (800 mg total) by mouth every 8 (eight) hours as needed., Disp: 30 tablet, Rfl: 0 .  LATUDA 60 MG TABS, TAKE 1 TABLET BY MOUTH  DAILY WITH SUPPER, Disp: 90 tablet, Rfl: 3 .  neomycin-polymyxin b-dexamethasone (MAXITROL) 3.5-10000-0.1 SUSP, INSTILL 1 DROP INTO LEFT EYE 4 TIMES A DAY, Disp: , Rfl: 0 .  pimecrolimus (ELIDEL) 1 % cream, Apply topically., Disp: , Rfl:  .  traZODone (DESYREL) 100 MG tablet, TAKE 1 AND 1/2 TABLETS BY  MOUTH AT BEDTIME, Disp: 135 tablet, Rfl: 3  Allergies  Allergen Reactions  . Penicillins Hives and Other (See Comments)    delusional  . Sulfa Antibiotics Hives and Other (See Comments)    dellusional  . Prednisone Other (See Comments)    Heart racing     ROS  Constitutional: Negative for fever or weight change.  Respiratory: Negative for cough and shortness of breath.   Cardiovascular: Negative for chest pain or palpitations.  Gastrointestinal: Negative for abdominal pain, no bowel changes.  Musculoskeletal: Negative for gait problem or joint swelling.  Skin: Positive for rash.  Has mole on the right outer thigh -  has been present for a few years and has noticed that it has been bleeding recently.  Neurological: Negative for dizziness or headache.  No other specific complaints in a complete review of systems (except as  listed in HPI above).  Objective  Vitals:   11/11/19 1236  BP: 120/70  Pulse: 64  Resp: 16  Temp: 97.9 F (36.6 C)  TempSrc: Temporal  SpO2: 97%  Weight: 200 lb 6.4 oz (90.9 kg)  Height: '5\' 9"'$  (1.753 m)    Body mass index is 29.59 kg/m.  Physical  Exam  Constitutional: Patient appears well-developed and well-nourished. No distress.  HENT: Head: Normocephalic and atraumatic. Ears: B TMs ok, no erythema or effusion; Nose: Nose normal. Mouth/Throat: Oropharynx is clear and moist. No oropharyngeal exudate.  Eyes: Conjunctivae and EOM are normal. Pupils are equal, round, and reactive to light. No scleral icterus.  Neck: Normal range of motion. Neck supple. No JVD present. No thyromegaly present.  Cardiovascular: Normal rate, regular rhythm and normal heart sounds.  No murmur heard. No BLE edema. Pulmonary/Chest: Effort normal and breath sounds normal. No respiratory distress. Abdominal: Soft. Bowel sounds are normal, no distension. There is no tenderness. no masses Breast: Deferred - had breast exam with Dr. Kenton [name] 10/15/2019 and declines additional examination FEMALE GENITALIA: Deferred Musculoskeletal: Normal range of motion, no joint effusions. No gross deformities Neurological: he is alert and oriented to person, place, and time. No cranial nerve deficit. Coordination, balance, strength, speech and gait are normal.  Skin: Skin is warm and dry. Darier's rash noted scattered confluent lesions - erythematous, raised areas . No erythema. There is an approx 67m nevus to the right lateral that is raised, black with small area of dried sanguinous drainage.  Psychiatric: Patient has a normal mood and affect. behavior is normal. Judgment and thought content normal.   Recent Results (from the past 2160 hour(s))  Lipid panel     Status: Abnormal   Collection Time: 08/25/19  1:04 PM  Result Value Ref Range   Cholesterol, Total 222 (H) 100 - 199 mg/dL   Triglycerides 143 0 - 149 mg/dL   HDL 56 >39 mg/dL   VLDL Cholesterol Cal 25 5 - 40 mg/dL   LDL Chol Calc (NIH) 141 (H) 0 - 99 mg/dL   Chol/HDL Ratio 4.0 0.0 - 4.4 ratio    Comment:                                   T. Chol/HDL Ratio                                             Men  Women                                1/2 Avg.Risk  3.4    3.3                                   Avg.Risk  5.0    4.4                                2X Avg.Risk  9.6    7.1  3X Avg.Risk 23.4   11.0   Prolactin     Status: None   Collection Time: 08/25/19  1:04 PM  Result Value Ref Range   Prolactin 8.2 4.8 - 23.3 ng/mL  Valproic acid level     Status: None   Collection Time: 08/25/19  1:04 PM  Result Value Ref Range   Valproic Acid Lvl 84 50 - 100 ug/mL    Comment:                                 Detection Limit = 4                            <4 indicates None Detected Toxicity may occur at levels of 100-500. Measurements of free unbound valproic acid may improve the assess- ment of clinical response.   CBC With Differential     Status: None   Collection Time: 08/25/19  1:04 PM  Result Value Ref Range   WBC 8.5 3.4 - 10.8 x10E3/uL   RBC 4.89 3.77 - 5.28 x10E6/uL   Hemoglobin 14.3 11.1 - 15.9 g/dL   Hematocrit 43.8 34.0 - 46.6 %   MCV 90 79 - 97 fL   MCH 29.2 26.6 - 33.0 pg   MCHC 32.6 31.5 - 35.7 g/dL   RDW 13.5 11.7 - 15.4 %   Neutrophils 51 Not Estab. %   Lymphs 37 Not Estab. %   Monocytes 7 Not Estab. %   Eos 3 Not Estab. %   Basos 1 Not Estab. %   Neutrophils Absolute 4.5 1.4 - 7.0 x10E3/uL   Lymphocytes Absolute 3.1 0.7 - 3.1 x10E3/uL   Monocytes Absolute 0.6 0.1 - 0.9 x10E3/uL   EOS (ABSOLUTE) 0.2 0.0 - 0.4 x10E3/uL   Basophils Absolute 0.1 0.0 - 0.2 x10E3/uL   Immature Granulocytes 1 Not Estab. %   Immature Grans (Abs) 0.0 0.0 - 0.1 x10E3/uL  Comprehensive metabolic panel     Status: Abnormal   Collection Time: 08/25/19  1:04 PM  Result Value Ref Range   Glucose 82 65 - 99 mg/dL   BUN 23 6 - 24 mg/dL   Creatinine, Ser 1.09 (H) 0.57 - 1.00 mg/dL   GFR calc non Af Amer 57 (L) >59 mL/min/1.73   GFR calc Af Amer 66 >59 mL/min/1.73   BUN/Creatinine Ratio 21 9 - 23   Sodium 140 134 - 144 mmol/L   Potassium 4.4 3.5 - 5.2 mmol/L   Chloride 101 96 - 106  mmol/L   CO2 25 20 - 29 mmol/L   Calcium 9.5 8.7 - 10.2 mg/dL   Total Protein 7.0 6.0 - 8.5 g/dL   Albumin 4.6 3.8 - 4.9 g/dL   Globulin, Total 2.4 1.5 - 4.5 g/dL   Albumin/Globulin Ratio 1.9 1.2 - 2.2   Bilirubin Total 0.2 0.0 - 1.2 mg/dL   Alkaline Phosphatase 44 39 - 117 IU/L   AST 14 0 - 40 IU/L   ALT 11 0 - 32 IU/L  Hemoglobin A1C     Status: None   Collection Time: 08/25/19  1:04 PM  Result Value Ref Range   Hgb A1c MFr Bld 5.3 4.8 - 5.6 %    Comment:          Prediabetes: 5.7 - 6.4          Diabetes: >6.4  Glycemic control for adults with diabetes: <7.0    Est. average glucose Bld gHb Est-mCnc 105 mg/dL    Fall Risk: Fall Risk  11/11/2019 10/15/2019 09/09/2018 01/11/2018 08/16/2017  Falls in the past year? 0 0 No No No  Number falls in past yr: 0 0 - - -  Injury with Fall? 0 - - - -  Follow up Falls evaluation completed Falls evaluation completed - - -   Assessment & Plan  1. Well woman exam (no gynecological exam) -USPSTF grade A and B recommendations reviewed with patient; age-appropriate recommendations, preventive care, screening tests, etc discussed and encouraged; healthy living encouraged; see AVS for patient education given to patient -Discussed importance of 150 minutes of physical activity weekly, eat two servings of fish weekly, eat one serving of tree nuts ( cashews, pistachios, pecans, almonds.Marland Kitchen) every other day, eat 6 servings of fruit/vegetables daily and drink plenty of water and avoid sweet beverages.  - Hepatitis C antibody - VITAMIN D 25 Hydroxy (Vit-D Deficiency, Fractures)  2. Need for hepatitis C screening test - Hepatitis C antibody  3. Screening for HIV without presence of risk factors - She declines this screening at the end of our visit - advised may always have ordered later.  4. Vitamin D deficiency - VITAMIN D 25 Hydroxy (Vit-D Deficiency, Fractures)  5. Encounter for surveillance of abnormal nevi - Ambulatory referral to  Dermatology

## 2019-11-11 NOTE — Patient Instructions (Signed)

## 2019-11-19 ENCOUNTER — Ambulatory Visit (INDEPENDENT_AMBULATORY_CARE_PROVIDER_SITE_OTHER): Payer: Managed Care, Other (non HMO) | Admitting: Family Medicine

## 2019-11-19 ENCOUNTER — Ambulatory Visit: Payer: Managed Care, Other (non HMO) | Attending: Internal Medicine

## 2019-11-19 ENCOUNTER — Encounter: Payer: Self-pay | Admitting: Family Medicine

## 2019-11-19 ENCOUNTER — Other Ambulatory Visit: Payer: Self-pay

## 2019-11-19 VITALS — Ht 69.0 in | Wt 200.0 lb

## 2019-11-19 DIAGNOSIS — J209 Acute bronchitis, unspecified: Secondary | ICD-10-CM

## 2019-11-19 DIAGNOSIS — J0101 Acute recurrent maxillary sinusitis: Secondary | ICD-10-CM

## 2019-11-19 DIAGNOSIS — Z20822 Contact with and (suspected) exposure to covid-19: Secondary | ICD-10-CM | POA: Diagnosis not present

## 2019-11-19 MED ORDER — DOXYCYCLINE HYCLATE 100 MG PO CAPS: 100.0000 mg | ORAL_CAPSULE | Freq: Two times a day (BID) | ORAL | 0 refills | Status: DC

## 2019-11-19 MED ORDER — BENZONATATE 100 MG PO CAPS: 100.0000 mg | ORAL_CAPSULE | Freq: Three times a day (TID) | ORAL | 0 refills | Status: DC | PRN

## 2019-11-19 MED ORDER — ALBUTEROL SULFATE HFA 108 (90 BASE) MCG/ACT IN AERS: 2.0000 | INHALATION_SPRAY | RESPIRATORY_TRACT | 1 refills | Status: DC | PRN

## 2019-11-19 MED ORDER — METHYLPREDNISOLONE 4 MG PO TBPK: ORAL_TABLET | ORAL | 0 refills | Status: DC

## 2019-11-19 NOTE — Progress Notes (Signed)
Name: Barbara [name]   MRN: [id number]    DOB: 1966/06/08   Date:11/19/2019       Progress Note  Subjective:    Chief Complaint  Chief Complaint  Patient presents with  . Sinus Problem    patient is congested, feels like something is sitting on her chest, onset 11/14/19-negative covid test on 11/15/19  . Headache    I connected with  [name] on 11/19/19 at 10:20 AM EST by telephone and verified that I am speaking with the correct person using two identifiers.   I discussed the limitations, risks, security and privacy concerns of performing an evaluation and management service by telephone and the availability of in person appointments. Staff also discussed with the patient that there may be a patient responsible charge related to this service. Patient Location: home Provider Location: [facility] clinic Additional Individuals present: none  HPI Patient presents with acute illness onset 5 days ago with possible Covid exposure Pt had possible COVID exposure on 12/26 with good friend who was asymptomatic and later tested positive for Covid -roughly 4 or 5 days later.  The patient is unsure when her friends symptoms started.  I have asked her to get that information so we can determine if there was actually an exposure which would be close contact with her friend in the 2 days prior to symptom onset right now is unclear her level of exposure.  She did take a Covid test the day after she was notified of her friends positive Covid test she began to have some nasal congestion at that time that was on 1 1 roughly 5 days ago.  Rapid test did come back negative.  She discussed her symptoms possible exposure and testing with her employer who suggested she do a office visit to get retested prior to returning to work. Patient also states that the rapid test was done at some kind of urgent care and the evaluation there she says she was told she had a viral illness and she was instructed to take  over-the-counter medicines including vitamin C and cold and cough medicines.  Patient states that she is currently taking elderberry, vit C, Vit D, tylenol for HA, [facility] sinus and congestion meds - DM mucinex decongestan Over the past 5 days her symptoms have gradually worsened and include clear nasal discharge, sneezing, HA, sinus and chest congestion, tenderness to her cheeks bilaterally, chest pressure "like something is sitting on my chest" but also described as chest tightness and difficulty breathing similar to past bronchitis, which she gets about once a year.  She states that she does just think she has a cold and a chest cold which is very typical for her at this time a year.   Patient denies associated fever, H/C chills, sweats, exertional SOB or CP, palpitations, near syncope, Le edema, orthopnea (congestion is a little worse - she's mostly been laying on her side).  She does feel a little tired and has not been doing much.  She does have some medications on her chart for seasonal allergies and rhinitis but she has run out She states that she does not have any history of hypertension or cardiac disease her main problem is cholesterol and psychiatric illness She does not have a way to monitor her blood pressure or heart rate at home    Patient Active Problem List   Diagnosis Date Noted  . Bipolar I disorder, most recent episode depressed with anxious distress ([place]) 07/28/2019  .  High risk medication use 07/28/2019  . Renal insufficiency 01/11/2018  . 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 Family hx of melanoma 01/11/2018  . Hx of malignant melanoma 01/11/2018  . Hyperplastic colon polyp 09/27/2017  . Medication monitoring encounter 09/05/2016  . Hyperlipidemia 02/09/2016  . Obesity 02/09/2016  . 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 Bipolar disorder, in partial remission, most recent episode depressed ([place]) 09/27/2015  . Other fatigue 09/27/2015    Social History   Tobacco Use    . Smoking status: Former Smoker    Types: Cigarettes    Quit date: 02/12/1990    Years since quitting: 29.7  . Smokeless tobacco: Never Used  Substance Use Topics  . Alcohol use: No    Alcohol/week: 0.0 standard drinks     Current Outpatient Medications:  .  acetaminophen (TYLENOL) 650 MG CR tablet, Take by mouth., Disp: , Rfl:  .  Ascorbic Acid (VITAMIN C) 1000 MG tablet, Take 2,000 mg by mouth daily., Disp: , Rfl:  .  Black Elderberry 50 MG/5ML SYRP, Take by mouth., Disp: , Rfl:  .  Cholecalciferol (VITAMIN D-1000 MAX ST) 25 MCG (1000 UT) tablet, Take by mouth., Disp: , Rfl:  .  divalproex (DEPAKOTE ER) 500 MG 24 hr tablet, TAKE 2 TABLETS BY MOUTH  DAILY, Disp: 180 tablet, Rfl: 3 .  pimecrolimus (ELIDEL) 1 % cream, Apply topically., Disp: , Rfl:  .  traZODone (DESYREL) 100 MG tablet, TAKE 1 AND 1/2 TABLETS BY  MOUTH AT BEDTIME, Disp: 135 tablet, Rfl: 3 .  azelastine (ASTELIN) 0.1 % nasal spray, PLACE 1 SPRAY INTO BOTH NOSTRILS 2 (TWO) TIMES DAILY, Disp: , Rfl:  .  diazepam (VALIUM) 5 MG tablet, Take 1 tablet (5 mg total) by mouth every 8 (eight) hours as needed for muscle spasms., Disp: 20 tablet, Rfl: 0 .  hydrOXYzine (ATARAX/VISTARIL) 25 MG tablet, Take 1 tablet (25 mg total) by mouth at bedtime as needed (sleep). (Patient not taking: Reported on 11/19/2019), Disp: 90 tablet, Rfl: 1 .  ibuprofen (ADVIL,MOTRIN) 800 MG tablet, Take 1 tablet (800 mg total) by mouth every 8 (eight) hours as needed. (Patient not taking: Reported on 11/19/2019), Disp: 30 tablet, Rfl: 0 .  LATUDA 60 MG TABS, TAKE 1 TABLET BY MOUTH  DAILY WITH SUPPER (Patient not taking: Reported on 11/19/2019), Disp: 90 tablet, Rfl: 3 .  neomycin-polymyxin b-dexamethasone (MAXITROL) 3.5-10000-0.1 SUSP, INSTILL 1 DROP INTO LEFT EYE 4 TIMES A DAY, Disp: , Rfl: 0  Allergies  Allergen Reactions  . Penicillins Hives and Other (See Comments)    delusional  . Sulfa Antibiotics Hives and Other (See Comments)    dellusional  .  Prednisone Other (See Comments)    Heart racing    I personally reviewed active problem list, medication list, allergies, family history, social history, health maintenance, notes from last encounter, lab results, imaging with the patient/caregiver today.   Review of Systems 10 Systems reviewed and are negative for acute change except as noted in the HPI.   Objective:    Virtual encounter, vitals limited, only able to obtain the following Today's Vitals   11/19/19 1005  Weight: 200 lb (90.7 kg)  Height: 5\' 9"  (1.753 m)  PainSc: 4    Body mass index is 29.53 kg/m. Nursing Note and Vital Signs reviewed.  Physical Exam Vitals and nursing note reviewed.  HENT:     Nose:     Comments: Sounds congested with nasal phonation and frequent sniffing Pulmonary:  Comments: Able to speak in short but full sentences, no audible wheeze or stridor, occasional coughing during encounter Neurological:     Mental Status: She is alert.     PE limited by telephone encounter  No results found for this or any previous visit (from the past 72 hour(s)).  Assessment and Plan:     ICD-10-CM   1. Contact with and (suspected) exposure to covid-19  Z20.822 COVID  2. Acute recurrent maxillary sinusitis  J01.01 doxycycline (VIBRAMYCIN) 100 MG capsule    COVID  3. Acute bronchitis, unspecified organism  J20.9 doxycycline (VIBRAMYCIN) 100 MG capsule    methylPREDNISolone (MEDROL DOSEPAK) 4 MG TBPK tablet    albuterol (VENTOLIN HFA) 108 (90 Base) MCG/ACT inhaler    benzonatate (TESSALON) 100 MG capsule    COVID    Patient reports chest pressure like someone sitting on her chest but also says that similar to when she gets bronchitis which is usually once a year and her symptoms feel typical of when she gets acute bronchitis.  She also is run out of her nasal and allergy medications and has congestion and tenderness to maxillary sinuses.  We will treat for possible acute bacterial sinusitis and  acute bronchitis with doxycycline, for bronchitis also doing a Medrol dose pack because she does not tolerate prednisone well we did discuss at length the expected side effects and the choice to treat her airways and see if she tolerates the side effects of the Dosepak, patient verbalized understanding of the possible side effects.  She generally uses inhaler once a year when she has bronchitis so an inhaler Tessalon Perles were sent in for her.  She was encouraged to use antihistamines and cough suppressants, push fluids rest and continue to isolate until her Covid test comes back.  We will do close follow-up.  Will need further information about her friends symptom onset to see if she did have a close exposure.  Early next week if she is improving and her Covid test is negative we may be able to return her to work since it will be 10 days since her symptom onset  Did review with patient concerning signs and symptoms of chest pain, worsening symptoms which would require in person evaluation but she should not delay going to urgent care or ER for evaluation she verbalized understanding. -Red flags and when to present for emergency care or RTC including fever >101.46F, chest pain, shortness of breath, new/worsening/un-resolving symptoms,  reviewed with patient at time of visit. Follow up and care instructions discussed and provided in AVS. - I discussed the assessment and treatment plan with the patient. The patient was provided an opportunity to ask questions and all were answered. The patient agreed with the plan and demonstrated an understanding of the instructions.  - The patient was advised to call back or seek an in-person evaluation if the symptoms worsen or if the condition fails to improve as anticipated.  Time-based billing for encounter for 15 minutes of nonface-to-face time with patient, chart review more than 5 minutes since patient is new to me, arranging care, prescribing medications arranging  follow-up, chart documentation totaling more than 30 minutes today.  I provided 15 minutes of non-face-to-face time during this encounter.  Delsa Grana, PA-C 11/19/19 10:52 AM

## 2019-11-19 NOTE — Addendum Note (Signed)
Addended by: Delsa Grana on: 11/19/2019 02:17 PM   Modules accepted: Orders

## 2019-11-20 LAB — NOVEL CORONAVIRUS, NAA: SARS-CoV-2, NAA: NOT DETECTED

## 2019-11-21 ENCOUNTER — Telehealth: Payer: Self-pay | Admitting: Family Medicine

## 2019-11-21 NOTE — Telephone Encounter (Signed)
Negative COVID results given. Patient results "NOT Detected." Caller expressed understanding. ° °

## 2019-11-26 ENCOUNTER — Encounter: Payer: Self-pay | Admitting: Family Medicine

## 2019-11-26 ENCOUNTER — Other Ambulatory Visit: Payer: Self-pay

## 2019-11-26 ENCOUNTER — Ambulatory Visit (INDEPENDENT_AMBULATORY_CARE_PROVIDER_SITE_OTHER): Payer: Managed Care, Other (non HMO) | Admitting: Family Medicine

## 2019-11-26 DIAGNOSIS — J209 Acute bronchitis, unspecified: Secondary | ICD-10-CM

## 2019-11-26 NOTE — Progress Notes (Signed)
Name: Barbara [name]   MRN: [id number]    DOB: [birth year]   Date:11/26/2019       Progress Note  Subjective  Chief Complaint  Chief Complaint  Patient presents with  . Follow-up    I connected with  [name] on 11/26/19 at 10:00 AM EST by telephone and verified that I am speaking with the correct person using two identifiers.   I discussed the limitations, risks, security and privacy concerns of performing an evaluation and management service by telephone and the availability of in person appointments. Staff also discussed with the patient that there may be a patient responsible charge related to this service. Patient Location: Home Provider Location: Office Additional Individuals present: None  HPI  Pt presents to follow up on respiratory illness - had COVID-19 exposure - she had negative Rapid at [facility], and negative PCR 11/19/19 after seeing Leisa PA-C for respiratory illness.  She was placed on Doxycycline, steroid taper, albuterol PRN, tesssalon PRN.  Denies chest pain, shortness of breath, body aches, fevers/chills, cough, GI upset. Fatigue is slowly improving, and overall she is feeling significantly bette.r  Patient Active Problem List   Diagnosis Date Noted  . Bipolar I disorder, most recent episode depressed with anxious distress ([place]) 07/28/2019  . High risk medication use 07/28/2019  . Renal insufficiency 01/11/2018  . 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 Family hx of melanoma 01/11/2018  . Hx of malignant melanoma 01/11/2018  . Hyperplastic colon polyp 09/27/2017  . Medication monitoring encounter 09/05/2016  . Hyperlipidemia 02/09/2016  . Obesity 02/09/2016  . 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 Bipolar disorder, in partial remission, most recent episode depressed ([place]) 09/27/2015  . Other fatigue 09/27/2015    Social History   Tobacco Use  . Smoking status: Former Smoker    Types: Cigarettes    Quit date: 02/12/1990    Years since quitting: 29.8  .  Smokeless tobacco: Never Used  Substance Use Topics  . Alcohol use: No    Alcohol/week: 0.0 standard drinks     Current Outpatient Medications:  .  acetaminophen (TYLENOL) 650 MG CR tablet, Take by mouth., Disp: , Rfl:  .  albuterol (VENTOLIN HFA) 108 (90 Base) MCG/ACT inhaler, Inhale 2 puffs into the lungs every 4 (four) hours as needed for wheezing or shortness of breath., Disp: 18 g, Rfl: 1 .  Ascorbic Acid (VITAMIN C) 1000 MG tablet, Take 2,000 mg by mouth daily., Disp: , Rfl:  .  azelastine (ASTELIN) 0.1 % nasal spray, PLACE 1 SPRAY INTO BOTH NOSTRILS 2 (TWO) TIMES DAILY, Disp: , Rfl:  .  benzonatate (TESSALON) 100 MG capsule, Take 1 capsule (100 mg total) by mouth 3 (three) times daily as needed for cough., Disp: 30 capsule, Rfl: 0 .  Black Elderberry 50 MG/5ML SYRP, Take by mouth., Disp: , Rfl:  .  Cholecalciferol (VITAMIN D-1000 MAX ST) 25 MCG (1000 UT) tablet, Take by mouth., Disp: , Rfl:  .  diazepam (VALIUM) 5 MG tablet, Take 1 tablet (5 mg total) by mouth every 8 (eight) hours as needed for muscle spasms., Disp: 20 tablet, Rfl: 0 .  divalproex (DEPAKOTE ER) 500 MG 24 hr tablet, TAKE 2 TABLETS BY MOUTH  DAILY, Disp: 180 tablet, Rfl: 3 .  doxycycline (VIBRAMYCIN) 100 MG capsule, Take 1 capsule (100 mg total) by mouth 2 (two) times daily., Disp: 20 capsule, Rfl: 0 .  neomycin-polymyxin b-dexamethasone (MAXITROL) 3.5-10000-0.1 SUSP, INSTILL 1 DROP INTO LEFT EYE 4 TIMES  A DAY, Disp: , Rfl: 0 .  pimecrolimus (ELIDEL) 1 % cream, Apply topically., Disp: , Rfl:  .  traZODone (DESYREL) 100 MG tablet, TAKE 1 AND 1/2 TABLETS BY  MOUTH AT BEDTIME, Disp: 135 tablet, Rfl: 3 .  hydrOXYzine (ATARAX/VISTARIL) 25 MG tablet, Take 1 tablet (25 mg total) by mouth at bedtime as needed (sleep). (Patient not taking: Reported on 11/19/2019), Disp: 90 tablet, Rfl: 1 .  ibuprofen (ADVIL,MOTRIN) 800 MG tablet, Take 1 tablet (800 mg total) by mouth every 8 (eight) hours as needed. (Patient not taking: Reported on  11/19/2019), Disp: 30 tablet, Rfl: 0 .  LATUDA 60 MG TABS, TAKE 1 TABLET BY MOUTH  DAILY WITH SUPPER (Patient not taking: Reported on 11/19/2019), Disp: 90 tablet, Rfl: 3 .  methylPREDNISolone (MEDROL DOSEPAK) 4 MG TBPK tablet, Take as directed.  6 tabs po on day 1, 5 tabs po day 2... 1 tab po day 6 (Patient not taking: Reported on 11/26/2019), Disp: 21 tablet, Rfl: 0  Allergies  Allergen Reactions  . Penicillins Hives and Other (See Comments)    delusional  . Sulfa Antibiotics Hives and Other (See Comments)    dellusional  . Prednisone Other (See Comments)    Heart racing    I personally reviewed active problem list, medication list, allergies, notes from last encounter, lab results with the patient/caregiver today.  ROS  Ten systems reviewed and is negative except as mentioned in HPI  Objective  Virtual encounter, vitals not obtained.  There is no height or weight on file to calculate BMI.  Nursing Note and Vital Signs reviewed.  Physical Exam  Pulmonary/Chest: Effort normal. No respiratory distress. Speaking in complete sentences Neurological: Pt is alert and oriented to person, place, and time. Speech is normal Psychiatric: Patient has a normal mood and affect. behavior is normal. Judgment and thought content normal.    No results found for this or any previous visit (from the past 72 hour(s)).  Assessment & Plan  1. Acute bronchitis, unspecified organism - Resolving; fatigue is still present but much improved; has a few days left on doxycycline that she will complete.  -Red flags and when to present for emergency care or RTC including fever >101.47F, chest pain, shortness of breath, new/worsening/un-resolving symptoms, reviewed with patient at time of visit. Follow up and care instructions discussed and provided in AVS. - I discussed the assessment and treatment plan with the patient. The patient was provided an opportunity to ask questions and all were answered. The  patient agreed with the plan and demonstrated an understanding of the instructions.  - The patient was advised to call back or seek an in-person evaluation if the symptoms worsen or if the condition fails to improve as anticipated.  I provided 9 minutes of non-face-to-face time during this encounter.  Hubbard Hartshorn, FNP

## 2019-12-02 ENCOUNTER — Telehealth: Payer: Self-pay

## 2019-12-02 DIAGNOSIS — F3176 Bipolar disorder, in full remission, most recent episode depressed: Secondary | ICD-10-CM

## 2019-12-02 MED ORDER — LURASIDONE HCL 60 MG PO TABS: 60.0000 mg | ORAL_TABLET | Freq: Every day | ORAL | 0 refills | Status: DC

## 2019-12-02 NOTE — Telephone Encounter (Signed)
pt called states that [facility] will not fill her medication because [facility] just filled medication. she wanted something else similar that she can take

## 2019-12-02 NOTE — Telephone Encounter (Signed)
spoke with pharmacy and they stated that pt would have to call her insurance company and explain what happen and that a new rx was sent to [facility] because she still have not received her [facility] rx order. they would only given me the name of the insurance was [employer]. pharmacy will not give any other info.

## 2019-12-02 NOTE — Telephone Encounter (Signed)
Sent a short supply of Latuda to [facility]

## 2019-12-02 NOTE — Telephone Encounter (Signed)
OK 

## 2019-12-02 NOTE — Telephone Encounter (Signed)
pt called states that [facility] has not sent out her latuda yet. and she is out. she states that she going to start using [facility] in [place] instead of [facility] because it is being delayed due to covid. so if you can please send into [facility] instead.

## 2019-12-02 NOTE — Telephone Encounter (Signed)
spoke with pt and gave her the info that pharamcy gave me. pt states that she doesnt have a [employer] she has a [employer]. pt was advised to call [facility] and give them the insurance info and see if it goes threw. if not she will have to call the insurance company and tell them what happen that [facility] order has not come in yet and that you almost out of medication.

## 2019-12-18 ENCOUNTER — Other Ambulatory Visit: Payer: Self-pay | Admitting: Obstetrics & Gynecology

## 2019-12-25 ENCOUNTER — Other Ambulatory Visit: Payer: Self-pay

## 2019-12-25 ENCOUNTER — Encounter: Payer: Self-pay | Admitting: Obstetrics & Gynecology

## 2019-12-25 ENCOUNTER — Ambulatory Visit
Admission: RE | Admit: 2019-12-25 | Discharge: 2019-12-25 | Disposition: A | Discharge status: Home / Self Care | Payer: Managed Care, Other (non HMO) | Source: Ambulatory Visit | Attending: Obstetrics & Gynecology | Admitting: Obstetrics & Gynecology

## 2019-12-25 DIAGNOSIS — Z1231 Encounter for screening mammogram for malignant neoplasm of breast: Secondary | ICD-10-CM | POA: Insufficient documentation

## 2019-12-26 LAB — HEPATITIS C ANTIBODY: Hep C Virus Ab: <0.1 s/co ratio (ref 0.0–0.9)

## 2019-12-26 LAB — VITAMIN D 25 HYDROXY (VIT D DEFICIENCY, FRACTURES): Vit D, 25-Hydroxy: 62.7 ng/mL (ref 30.0–100.0)

## 2020-02-10 ENCOUNTER — Other Ambulatory Visit: Payer: Self-pay

## 2020-02-10 ENCOUNTER — Ambulatory Visit (INDEPENDENT_AMBULATORY_CARE_PROVIDER_SITE_OTHER): Payer: 59 | Admitting: Psychiatry

## 2020-02-10 ENCOUNTER — Encounter: Payer: Self-pay | Admitting: Psychiatry

## 2020-02-10 DIAGNOSIS — F5105 Insomnia due to other mental disorder: Secondary | ICD-10-CM | POA: Diagnosis not present

## 2020-02-10 DIAGNOSIS — F3176 Bipolar disorder, in full remission, most recent episode depressed: Secondary | ICD-10-CM | POA: Diagnosis not present

## 2020-02-10 NOTE — Progress Notes (Signed)
Provider Location : [facility] Patient Location : Home  Virtual Visit via Telephone Note  I connected with Barbara [name] on 02/10/20 at  4:00 PM EDT by telephone and verified that I am speaking with the correct person using two identifiers.   I discussed the limitations, risks, security and privacy concerns of performing an evaluation and management service by telephone and the availability of in person appointments. I also discussed with the patient that there may be a patient responsible charge related to this service. The patient expressed understanding and agreed to proceed.      I discussed the assessment and treatment plan with the patient. The patient was provided an opportunity to ask questions and all were answered. The patient agreed with the plan and demonstrated an understanding of the instructions.   The patient was advised to call back or seek an in-person evaluation if the symptoms worsen or if the condition fails to improve as anticipated.   [place] MD OP Progress Note  02/10/2020 5:46 PM Barbara [name]  MRN:  [id number]  Chief Complaint:  Chief Complaint    Follow-up     HPI: Barbara [name] is a 54 year old Caucasian female, lives in [place], employed, divorced, has a history of bipolar disorder was evaluated by phone today.  Patient preferred to do a phone call.  Patient today reports she is currently doing well with regards to her mood.  She is compliant on medications as prescribed.  Patient reports sleep and appetite is fair.  Patient denies any suicidality, homicidality or perceptual disturbances.  Patient reports her work continues to go well.  She continues to stay safe from COVID-19.  She has not been able to get her vaccination yet.  Patient denies any other concerns today. Visit Diagnosis:    ICD-10-CM   1. Bipolar disorder, in full remission, most recent episode depressed ([place])  F31.76   2. Insomnia due to mental disorder  F51.05     Past Psychiatric History:  I have reviewed past psychiatric history from my progress note on 02/12/2018.  Past Medical History:  Past Medical History:  Diagnosis Date  . Acid reflux   . Adnexal mass   . Bipolar disorder ([place])   . Depression   . Depression   . Herpes   . Irregular menstrual bleeding   . Melanoma [facility])     Past Surgical History:  Procedure Laterality Date  . CERVICAL BIOPSY     Dr. Kenton Kingfisher with [facility]  . COLONOSCOPY    . COLONOSCOPY    . DILATION AND CURETTAGE OF UTERUS    . MELANOMA EXCISION    . TONSILLECTOMY      Family Psychiatric History: I have reviewed family psychiatric history from my progress note on 02/12/2018.  Family History:  Family History  Problem Relation Age of Onset  . Heart attack Father   . Cancer Father        melanoma  . Cancer Maternal Aunt        breast  . Breast cancer Maternal Aunt   . Cancer Paternal Aunt        breast  . Breast cancer Paternal Aunt   . Breast cancer Cousin     Social History: Reviewed social history from my progress note on 02/12/2018. Social History   Socioeconomic History  . Marital status: Divorced    Spouse name: Not on file  . Number of children: 1  . Years of education: Not on file  . Highest education  level: Associate degree: occupational, Hotel manager, or vocational program  Occupational History    Comment: full time  Tobacco Use  . Smoking status: Former Smoker    Types: Cigarettes    Quit date: 02/12/1990    Years since quitting: 30.0  . Smokeless tobacco: Never Used  Substance and Sexual Activity  . Alcohol use: No    Alcohol/week: 0.0 standard drinks  . Drug use: No  . Sexual activity: Not Currently  Other Topics Concern  . Not on file  Social History Narrative  . Not on file   Social Determinants of Health   Financial Resource Strain:   . Difficulty of Paying Living Expenses:   Food Insecurity:   . Worried About Charity fundraiser in the Last Year:   . Arboriculturist in the Last Year:    Transportation Needs:   . Film/video editor (Medical):   Marland Kitchen Lack of Transportation (Non-Medical):   Physical Activity:   . Days of Exercise per Week:   . Minutes of Exercise per Session:   Stress:   . Feeling of Stress :   Social Connections:   . Frequency of Communication with Friends and Family:   . Frequency of Social Gatherings with Friends and Family:   . Attends Religious Services:   . Active Member of Clubs or Organizations:   . Attends Archivist Meetings:   Marland Kitchen Marital Status:     Allergies:  Allergies  Allergen Reactions  . Penicillins Hives and Other (See Comments)    delusional  . Sulfa Antibiotics Hives and Other (See Comments)    dellusional  . Prednisone Other (See Comments)    Heart racing    Metabolic Disorder Labs: Lab Results  Component Value Date   HGBA1C 5.3 08/25/2019   Lab Results  Component Value Date   PROLACTIN 8.2 08/25/2019   PROLACTIN 11.6 05/17/2018   Lab Results  Component Value Date   CHOL 222 (H) 08/25/2019   TRIG 143 08/25/2019   HDL 56 08/25/2019   CHOLHDL 4.0 08/25/2019   LDLCALC 141 (H) 08/25/2019   LDLCALC 155 (H) 05/17/2018   Lab Results  Component Value Date   TSH 4.030 08/17/2017   TSH 2.770 10/14/2015    Therapeutic Level Labs: No results found for: LITHIUM Lab Results  Component Value Date   VALPROATE 84 08/25/2019   VALPROATE 87 05/17/2018   No components found for:  CBMZ  Current Medications: Current Outpatient Medications  Medication Sig Dispense Refill  . acetaminophen (TYLENOL) 650 MG CR tablet Take by mouth.    Marland Kitchen albuterol (VENTOLIN HFA) 108 (90 Base) MCG/ACT inhaler Inhale 2 puffs into the lungs every 4 (four) hours as needed for wheezing or shortness of breath. 18 g 1  . Ascorbic Acid (VITAMIN C) 1000 MG tablet Take 2,000 mg by mouth daily.    Marland Kitchen azelastine (ASTELIN) 0.1 % nasal spray PLACE 1 SPRAY INTO BOTH NOSTRILS 2 (TWO) TIMES DAILY    . benzonatate (TESSALON) 100 MG capsule Take 1  capsule (100 mg total) by mouth 3 (three) times daily as needed for cough. 30 capsule 0  . Black Elderberry 50 MG/5ML SYRP Take by mouth.    . Cholecalciferol (VITAMIN D-1000 MAX ST) 25 MCG (1000 UT) tablet Take by mouth.    . diazepam (VALIUM) 5 MG tablet Take 1 tablet (5 mg total) by mouth every 8 (eight) hours as needed for muscle spasms. 20 tablet 0  . divalproex (DEPAKOTE ER)  500 MG 24 hr tablet TAKE 2 TABLETS BY MOUTH  DAILY 180 tablet 3  . doxycycline (VIBRAMYCIN) 100 MG capsule Take 1 capsule (100 mg total) by mouth 2 (two) times daily. 20 capsule 0  . hydrOXYzine (ATARAX/VISTARIL) 25 MG tablet Take 1 tablet (25 mg total) by mouth at bedtime as needed (sleep). (Patient not taking: Reported on 11/19/2019) 90 tablet 1  . ibuprofen (ADVIL,MOTRIN) 800 MG tablet Take 1 tablet (800 mg total) by mouth every 8 (eight) hours as needed. (Patient not taking: Reported on 11/19/2019) 30 tablet 0  . LATUDA 60 MG TABS TAKE 1 TABLET BY MOUTH  DAILY WITH SUPPER (Patient not taking: Reported on 11/19/2019) 90 tablet 3  . Lurasidone HCl 60 MG TABS Take 1 tablet (60 mg total) by mouth at bedtime. 30 tablet 0  . methylPREDNISolone (MEDROL DOSEPAK) 4 MG TBPK tablet Take as directed.  6 tabs po on day 1, 5 tabs po day 2... 1 tab po day 6 (Patient not taking: Reported on 11/26/2019) 21 tablet 0  . neomycin-polymyxin b-dexamethasone (MAXITROL) 3.5-10000-0.1 SUSP INSTILL 1 DROP INTO LEFT EYE 4 TIMES A DAY  0  . pimecrolimus (ELIDEL) 1 % cream Apply topically.    . traZODone (DESYREL) 100 MG tablet TAKE 1 AND 1/2 TABLETS BY  MOUTH AT BEDTIME 135 tablet 3  . triamcinolone cream (KENALOG) 0.1 % APPLY TO AFFECTED AREA TWICE A DAY     No current facility-administered medications for this visit.     Musculoskeletal: Strength & Muscle Tone: UTA Gait & Station: UTA Patient leans: N/A  Psychiatric Specialty Exam: Review of Systems  Psychiatric/Behavioral: Negative for agitation, behavioral problems, confusion, decreased  concentration, dysphoric mood, hallucinations, self-injury, sleep disturbance and suicidal ideas. The patient is not nervous/anxious and is not hyperactive.   All other systems reviewed and are negative.   There were no vitals taken for this visit.There is no height or weight on file to calculate BMI.  General Appearance: UTA  Eye Contact:  UTA  Speech:  Clear and Coherent  Volume:  UTA  Mood:  Euthymic  Affect:  UTA  Thought Process:  Goal Directed and Descriptions of Associations: Intact  Orientation:  Full (Time, Place, and Person)  Thought Content: Logical   Suicidal Thoughts:  No  Homicidal Thoughts:  No  Memory:  Immediate;   Fair Recent;   Fair Remote;   Fair  Judgement:  Fair  Insight:  Fair  Psychomotor Activity:  Normal  Concentration:  Concentration: Fair and Attention Span: Fair  Recall:  [employer] of Knowledge: Fair  Language: Fair  Akathisia:  No  Handed:  Right  AIMS (if indicated):UTA  Assets:  Communication Skills Desire for Improvement Housing Social Support  ADL's:  Intact  Cognition: WNL  Sleep:  Fair   Screenings: AIMS     Office Visit from 02/12/2018 in [place] Total Score  0    PHQ2-9     Office Visit from 11/26/2019 in [facility] Office Visit from 11/19/2019 in [facility] Office Visit from 11/11/2019 in [facility] Office Visit from 10/15/2019 in [facility] Office Visit from 09/09/2018 in [facility]  PHQ-2 Total Score  0  0  0  0  0  PHQ-9 Total Score  0  0  0  0  2       Assessment and Plan: Berklie is a 54 year old Caucasian female, employed, divorced,  lives in [place], has a history of bipolar disorder was evaluated by phone today.  Patient is currently stable on current medication regimen.  Plan as noted below.  Plan Bipolar disorder in remission Depakote ER 1000 mg at bedtime Latuda 60 mg p.o.  daily with meals Depakote level on 08/25/2019 -84-therapeutic  Insomnia-stable Trazodone 150 mg p.o. nightly Patient also has hydroxyzine as needed at bedtime She will continue sleep hygiene techniques  Follow-up in clinic in 3 to 4 months or sooner if needed.  I have spent atleast 20 minutes non face to face with patient today. More than 50 % of the time was spent for preparing to see the patient ( e.g., review of test, records ),ordering medications and test ,psychoeducation and supportive psychotherapy and care coordination,as well as documenting clinical information in electronic health record.This note was generated in part or whole with voice recognition software. Voice recognition is usually quite accurate but there are transcription errors that can and very often do occur. I apologize for any typographical errors that were not detected and corrected.       Ursula Alert, MD 02/10/2020, 5:46 PM

## 2020-05-18 ENCOUNTER — Other Ambulatory Visit: Payer: Self-pay

## 2020-05-18 ENCOUNTER — Encounter: Payer: Self-pay | Admitting: Psychiatry

## 2020-05-18 ENCOUNTER — Telehealth (INDEPENDENT_AMBULATORY_CARE_PROVIDER_SITE_OTHER): Payer: 59 | Admitting: Psychiatry

## 2020-05-18 DIAGNOSIS — F5105 Insomnia due to other mental disorder: Secondary | ICD-10-CM

## 2020-05-18 DIAGNOSIS — Z79899 Other long term (current) drug therapy: Secondary | ICD-10-CM

## 2020-05-18 DIAGNOSIS — F3176 Bipolar disorder, in full remission, most recent episode depressed: Secondary | ICD-10-CM

## 2020-05-18 DIAGNOSIS — F99 Mental disorder, not otherwise specified: Secondary | ICD-10-CM | POA: Diagnosis not present

## 2020-05-18 MED ORDER — TRAZODONE HCL 100 MG PO TABS: ORAL_TABLET | ORAL | 1 refills | Status: DC

## 2020-05-18 MED ORDER — DIVALPROEX SODIUM ER 500 MG PO TB24: 1000.0000 mg | ORAL_TABLET | Freq: Every day | ORAL | 1 refills | Status: DC

## 2020-05-18 MED ORDER — LURASIDONE HCL 60 MG PO TABS: 60.0000 mg | ORAL_TABLET | Freq: Every day | ORAL | 1 refills | Status: DC

## 2020-05-18 NOTE — Progress Notes (Signed)
Provider Location : [facility] Patient Location : Home Virtual Visit via Telephone Note  I connected with [name] on 05/18/20 at  4:00 PM EDT by telephone and verified that I am speaking with the correct person using two identifiers.   I discussed the limitations, risks, security and privacy concerns of performing an evaluation and management service by telephone and the availability of in person appointments. I also discussed with the patient that there may be a patient responsible charge related to this service. The patient expressed understanding and agreed to proceed.   I discussed the assessment and treatment plan with the patient. The patient was provided an opportunity to ask questions and all were answered. The patient agreed with the plan and demonstrated an understanding of the instructions.   The patient was advised to call back or seek an in-person evaluation if the symptoms worsen or if the condition fails to improve as anticipated.   [place] MD OP Progress Note  05/18/2020 5:28 PM Barbara [name]  MRN:  [id number]  Chief Complaint:  Chief Complaint    Follow-up     HPI: Barbara [name] is a 54 year old Caucasian female, lives in [place], employed, divorced, has a history of bipolar disorder was evaluated by phone today.  Patient preferred to do a phone call.  Patient today reports she is currently doing well on the current medication regimen.  Denies any symptoms of depression or mania.  She reports sleep is good.  She reports appetite is fair.  She is compliant on medications as prescribed.  She reports she is not interested in tapering off of the [place] and would like to stay on the same dosage since she is worried about decompensation if she were to come off of it.  Patient denies any other concerns today.  Visit Diagnosis:    ICD-10-CM   1. Bipolar disorder, in full remission, most recent episode depressed ([facility])  F31.76 divalproex (DEPAKOTE ER) 500 MG 24 hr  tablet    Lurasidone HCl 60 MG TABS    TSH  2. Insomnia due to other mental disorder  F51.05 traZODone (DESYREL) 100 MG tablet   F99 TSH  3. High risk medication use  Z79.899 Valproic acid level    CBC With Differential    Comprehensive metabolic panel    Prolactin    Hemoglobin A1C    Past Psychiatric History: I have reviewed past psychiatric history from my progress note from 02/12/2018.  Past Medical History:  Past Medical History:  Diagnosis Date  . Acid reflux   . Adnexal mass   . Bipolar disorder ([place])   . Depression   . Depression   . Herpes   . Irregular menstrual bleeding   . Melanoma [facility]     Past Surgical History:  Procedure Laterality Date  . CERVICAL BIOPSY     Dr. Kenton Kingfisher with [facility]  . COLONOSCOPY    . COLONOSCOPY    . DILATION AND CURETTAGE OF UTERUS    . MELANOMA EXCISION    . TONSILLECTOMY      Family Psychiatric History: I have reviewed family psychiatric history from my progress note on 02/12/2018  Family History:  Family History  Problem Relation Age of Onset  . Heart attack Father   . Cancer Father        melanoma  . Cancer Maternal Aunt        breast  . Breast cancer Maternal Aunt   . Cancer Paternal Aunt  breast  . Breast cancer Paternal Aunt   . Breast cancer Cousin     Social History: I have reviewed social history from my progress note on 02/12/2018 Social History   Socioeconomic History  . Marital status: Divorced    Spouse name: Not on file  . Number of children: 1  . Years of education: Not on file  . Highest education level: Associate degree: occupational, Hotel manager, or vocational program  Occupational History    Comment: full time  Tobacco Use  . Smoking status: Former Smoker    Types: Cigarettes    Quit date: 02/12/1990    Years since quitting: 30.2  . Smokeless tobacco: Never Used  Vaping Use  . Vaping Use: Never used  Substance and Sexual Activity  . Alcohol use: No    Alcohol/week: 0.0 standard  drinks  . Drug use: No  . Sexual activity: Not Currently  Other Topics Concern  . Not on file  Social History Narrative  . Not on file   Social Determinants of Health   Financial Resource Strain:   . Difficulty of Paying Living Expenses:   Food Insecurity:   . Worried About Charity fundraiser in the Last Year:   . Arboriculturist in the Last Year:   Transportation Needs:   . Film/video editor (Medical):   Marland Kitchen Lack of Transportation (Non-Medical):   Physical Activity:   . Days of Exercise per Week:   . Minutes of Exercise per Session:   Stress:   . Feeling of Stress :   Social Connections:   . Frequency of Communication with Friends and Family:   . Frequency of Social Gatherings with Friends and Family:   . Attends Religious Services:   . Active Member of Clubs or Organizations:   . Attends Archivist Meetings:   Marland Kitchen Marital Status:     Allergies:  Allergies  Allergen Reactions  . Penicillins Hives and Other (See Comments)    delusional  . Sulfa Antibiotics Hives and Other (See Comments)    dellusional  . Prednisone Other (See Comments)    Heart racing    Metabolic Disorder Labs: Lab Results  Component Value Date   HGBA1C 5.3 08/25/2019   Lab Results  Component Value Date   PROLACTIN 8.2 08/25/2019   PROLACTIN 11.6 05/17/2018   Lab Results  Component Value Date   CHOL 222 (H) 08/25/2019   TRIG 143 08/25/2019   HDL 56 08/25/2019   CHOLHDL 4.0 08/25/2019   LDLCALC 141 (H) 08/25/2019   LDLCALC 155 (H) 05/17/2018   Lab Results  Component Value Date   TSH 4.030 08/17/2017   TSH 2.770 10/14/2015    Therapeutic Level Labs: No results found for: LITHIUM Lab Results  Component Value Date   VALPROATE 84 08/25/2019   VALPROATE 87 05/17/2018   No components found for:  CBMZ  Current Medications: Current Outpatient Medications  Medication Sig Dispense Refill  . acetaminophen (TYLENOL) 650 MG CR tablet Take by mouth.    Marland Kitchen albuterol  (VENTOLIN HFA) 108 (90 Base) MCG/ACT inhaler Inhale 2 puffs into the lungs every 4 (four) hours as needed for wheezing or shortness of breath. 18 g 1  . Ascorbic Acid (VITAMIN C) 1000 MG tablet Take 2,000 mg by mouth daily.    Marland Kitchen azelastine (ASTELIN) 0.1 % nasal spray PLACE 1 SPRAY INTO BOTH NOSTRILS 2 (TWO) TIMES DAILY    . benzonatate (TESSALON) 100 MG capsule Take 1 capsule (100  mg total) by mouth 3 (three) times daily as needed for cough. 30 capsule 0  . Black Elderberry 50 MG/5ML SYRP Take by mouth.    . Cholecalciferol (VITAMIN D-1000 MAX ST) 25 MCG (1000 UT) tablet Take by mouth.    . diazepam (VALIUM) 5 MG tablet Take 1 tablet (5 mg total) by mouth every 8 (eight) hours as needed for muscle spasms. 20 tablet 0  . divalproex (DEPAKOTE ER) 500 MG 24 hr tablet Take 2 tablets (1,000 mg total) by mouth daily. 180 tablet 1  . doxycycline (VIBRAMYCIN) 100 MG capsule Take 1 capsule (100 mg total) by mouth 2 (two) times daily. 20 capsule 0  . Lurasidone HCl 60 MG TABS Take 1 tablet (60 mg total) by mouth daily with supper. 90 tablet 1  . methylPREDNISolone (MEDROL DOSEPAK) 4 MG TBPK tablet Take as directed.  6 tabs po on day 1, 5 tabs po day 2... 1 tab po day 6 (Patient not taking: Reported on 11/26/2019) 21 tablet 0  . neomycin-polymyxin b-dexamethasone (MAXITROL) 3.5-10000-0.1 SUSP INSTILL 1 DROP INTO LEFT EYE 4 TIMES A DAY  0  . pimecrolimus (ELIDEL) 1 % cream Apply topically.    . traZODone (DESYREL) 100 MG tablet Take 1 tablet at bedtime 135 tablet 1  . triamcinolone cream (KENALOG) 0.1 % APPLY TO AFFECTED AREA TWICE A DAY     No current facility-administered medications for this visit.     Musculoskeletal: Strength & Muscle Tone: UTA Gait & Station: normal as reported Patient leans: N/A  Psychiatric Specialty Exam: Review of Systems  Psychiatric/Behavioral: Negative for agitation, behavioral problems, confusion, decreased concentration, dysphoric mood, hallucinations, self-injury, sleep  disturbance and suicidal ideas. The patient is not nervous/anxious and is not hyperactive.   All other systems reviewed and are negative.   There were no vitals taken for this visit.There is no height or weight on file to calculate BMI.  General Appearance: UTA  Eye Contact:  UTA  Speech:  Clear and Coherent  Volume:  Normal  Mood:  Euthymic  Affect:  UTA  Thought Process:  Goal Directed and Descriptions of Associations: Intact  Orientation:  Full (Time, Place, and Person)  Thought Content: Logical   Suicidal Thoughts:  No  Homicidal Thoughts:  No  Memory:  Immediate;   Fair Recent;   Fair Remote;   Fair  Judgement:  Fair  Insight:  Fair  Psychomotor Activity:  UTA  Concentration:  Concentration: Fair and Attention Span: Fair  Recall:  [employer] of Knowledge: Fair  Language: Fair  Akathisia:  No  Handed:  Right  AIMS (if indicated): UTA  Assets:  Communication Skills Desire for Improvement Housing Social Support  ADL's:  Intact  Cognition: WNL  Sleep:  Fair   Screenings: AIMS     Office Visit from 02/12/2018 in [place] Total Score 0    PHQ2-9     Office Visit from 11/26/2019 in [facility] Office Visit from 11/19/2019 in [facility] Office Visit from 11/11/2019 in [facility] Office Visit from 10/15/2019 in [facility] Office Visit from 09/09/2018 in [facility]  PHQ-2 Total Score 0 0 0 0 0  PHQ-9 Total Score 0 0 0 0 2       Assessment and Plan: ADELA ESTEBAN is a 54 year old Caucasian female, employed, divorced, lives in [place], has a history of bipolar disorder was evaluated by phone today.  Patient is currently stable on current medication regimen.  Plan as noted below.  Plan Bipolar disorder in remission Depakote ER 1000 mg at bedtime. Latuda 60 mg p.o. daily with meals Depakote level on 08/25/2019  -84-therapeutic  Insomnia-stable Trazodone 150 mg p.o. nightly Continue sleep hygiene techniques Patient also has hydroxyzine available at bedtime as needed  High risk medication use-will order Depakote level, CMP, CBC with differential, prolactin, hemoglobin A1c, TSH.  Patient will get it done.  Follow-up in clinic in 3 to 4 months or sooner if needed.  I have spent atleast 20 minutes non face to face with patient today. More than 50 % of the time was spent for preparing to see the patient ( e.g., review of test, records ), ordering medications and test ,psychoeducation and supportive psychotherapy and care coordination,as well as documenting clinical information in electronic health record. This note was generated in part or whole with voice recognition software. Voice recognition is usually quite accurate but there are transcription errors that can and very often do occur. I apologize for any typographical errors that were not detected and corrected.        Ursula Alert, MD 05/18/2020, 5:28 PM

## 2020-05-19 ENCOUNTER — Telehealth: Payer: Self-pay

## 2020-05-19 NOTE — Telephone Encounter (Signed)
lab work orders mailed out  

## 2020-08-13 ENCOUNTER — Ambulatory Visit (INDEPENDENT_AMBULATORY_CARE_PROVIDER_SITE_OTHER): Payer: Managed Care, Other (non HMO) | Admitting: Internal Medicine

## 2020-08-13 ENCOUNTER — Encounter: Payer: Self-pay | Admitting: Internal Medicine

## 2020-08-13 ENCOUNTER — Other Ambulatory Visit: Payer: Self-pay

## 2020-08-13 VITALS — BP 108/68 | HR 87 | Temp 98.0°F | Resp 16 | Ht 69.0 in | Wt 200.0 lb

## 2020-08-13 DIAGNOSIS — E663 Overweight: Secondary | ICD-10-CM | POA: Diagnosis not present

## 2020-08-13 NOTE — Progress Notes (Signed)
Patient ID: Barbara [name], female    DOB: 1966/08/23, 54 y.o.   MRN: [id number]  PCP: Hubbard Hartshorn, FNP  Chief Complaint  Patient presents with  . Weight Check    Per her employeer she did not meet the weigh requirements in July and has to have a form filled out from [employer]    Subjective:   Barbara [name] is a 54 y.o. female, presents to clinic with CC of the following:  Chief Complaint  Patient presents with  . Weight Check    Per her employeer she did not meet the weigh requirements in July and has to have a form filled out from [employer]    HPI:  Patient is a 54 year old female former patient of Raelyn Ensign Last visit at [facility] was in January 2021 Follows up today with a form she needs completed for [facility].  She notes that she did not meet the BMI requirement of less than 30 when she was assessed in late July, and has a form to complete now for her to return to hopefully not have to pay more money for her insurance this coming year.  ( $600 more) She notes she has been trying to lose weight since, and has had some success through dietary modifications and trying to increase physical activity levels. Her weight has decreased and now her BMI today is less than 30 (29.53)  The form was reviewed with the patient, and she needed to complete and sign the upper portion.  She notes that in general, she feels well, and has no specific complaints today otherwise.  Wt Readings from Last 3 Encounters:  08/13/20 200 lb (90.7 kg)  11/19/19 200 lb (90.7 kg)  11/11/19 200 lb 6.4 oz (90.9 kg)     Patient Active Problem List   Diagnosis Date Noted  . Bipolar disorder, in full remission, most recent episode depressed ([place]) 02/10/2020  . Bipolar I disorder, most recent episode depressed with anxious distress ([place]) 07/28/2019  . High risk medication use 07/28/2019  . Renal insufficiency 01/11/2018  . 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 Family hx of melanoma 01/11/2018  . Hx of malignant  melanoma 01/11/2018  . Hyperplastic colon polyp 09/27/2017  . Medication monitoring encounter 09/05/2016  . Hyperlipidemia 02/09/2016  . Obesity 02/09/2016  . 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 Bipolar disorder, in partial remission, most recent episode depressed ([place]) 09/27/2015  . Other fatigue 09/27/2015      Current Outpatient Medications:  .  acetaminophen (TYLENOL) 650 MG CR tablet, Take by mouth., Disp: , Rfl:  .  albuterol (VENTOLIN HFA) 108 (90 Base) MCG/ACT inhaler, Inhale 2 puffs into the lungs every 4 (four) hours as needed for wheezing or shortness of breath., Disp: 18 g, Rfl: 1 .  Ascorbic Acid (VITAMIN C) 1000 MG tablet, Take 2,000 mg by mouth daily., Disp: , Rfl:  .  azelastine (ASTELIN) 0.1 % nasal spray, PLACE 1 SPRAY INTO BOTH NOSTRILS 2 (TWO) TIMES DAILY, Disp: , Rfl:  .  benzonatate (TESSALON) 100 MG capsule, Take 1 capsule (100 mg total) by mouth 3 (three) times daily as needed for cough., Disp: 30 capsule, Rfl: 0 .  Black Elderberry 50 MG/5ML SYRP, Take by mouth., Disp: , Rfl:  .  Cholecalciferol (VITAMIN D-1000 MAX ST) 25 MCG (1000 UT) tablet, Take by mouth., Disp: , Rfl:  .  diazepam (VALIUM) 5 MG tablet, Take 1 tablet (5 mg total) by mouth every 8 (  eight) hours as needed for muscle spasms., Disp: 20 tablet, Rfl: 0 .  divalproex (DEPAKOTE ER) 500 MG 24 hr tablet, Take 2 tablets (1,000 mg total) by mouth daily., Disp: 180 tablet, Rfl: 1 .  doxycycline (VIBRAMYCIN) 100 MG capsule, Take 1 capsule (100 mg total) by mouth 2 (two) times daily., Disp: 20 capsule, Rfl: 0 .  Lurasidone HCl 60 MG TABS, Take 1 tablet (60 mg total) by mouth daily with supper., Disp: 90 tablet, Rfl: 1 .  methylPREDNISolone (MEDROL DOSEPAK) 4 MG TBPK tablet, Take as directed.  6 tabs po on day 1, 5 tabs po day 2... 1 tab po day 6 (Patient not taking: Reported on 11/26/2019), Disp: 21 tablet, Rfl: 0 .  neomycin-polymyxin b-dexamethasone (MAXITROL) 3.5-10000-0.1 SUSP, INSTILL 1 DROP INTO  LEFT EYE 4 TIMES A DAY, Disp: , Rfl: 0 .  pimecrolimus (ELIDEL) 1 % cream, Apply topically., Disp: , Rfl:  .  traZODone (DESYREL) 100 MG tablet, Take 1 tablet at bedtime, Disp: 135 tablet, Rfl: 1 .  triamcinolone cream (KENALOG) 0.1 %, APPLY TO AFFECTED AREA TWICE A DAY, Disp: , Rfl:    Allergies  Allergen Reactions  . Penicillins Hives and Other (See Comments)    delusional  . Sulfa Antibiotics Hives and Other (See Comments)    dellusional  . Prednisone Other (See Comments)    Heart racing     Past Surgical History:  Procedure Laterality Date  . CERVICAL BIOPSY     Dr. Kenton Kingfisher with [facility]  . COLONOSCOPY    . COLONOSCOPY    . DILATION AND CURETTAGE OF UTERUS    . MELANOMA EXCISION    . TONSILLECTOMY       Family History  Problem Relation Age of Onset  . Heart attack Father   . Cancer Father        melanoma  . Cancer Maternal Aunt        breast  . Breast cancer Maternal Aunt   . Cancer Paternal Aunt        breast  . Breast cancer Paternal Aunt   . Breast cancer Cousin      Social History   Tobacco Use  . Smoking status: Former Smoker    Types: Cigarettes    Quit date: 02/12/1990    Years since quitting: 30.5  . Smokeless tobacco: Never Used  Substance Use Topics  . Alcohol use: No    Alcohol/week: 0.0 standard drinks    With staff assistance, above reviewed with the patient today.  ROS: As per HPI, otherwise no specific complaints on a limited and focused system review   No results found for this or any previous visit (from the past 72 hour(s)).   PHQ2/9: Depression screen [facility] 2/9 08/13/2020 11/26/2019 11/19/2019 11/11/2019 10/15/2019  Decreased Interest 0 0 0 0 0  Down, Depressed, Hopeless 0 0 0 0 0  PHQ - 2 Score 0 0 0 0 0  Altered sleeping - 0 0 0 0  Tired, decreased energy - 0 0 0 0  Change in appetite - 0 0 0 0  Feeling bad or failure about yourself  - 0 0 0 0  Trouble concentrating - 0 0 0 0  Moving slowly or fidgety/restless - 0 0 0 0    Suicidal thoughts - 0 0 0 0  PHQ-9 Score - 0 0 0 0  Difficult doing work/chores - Not difficult at all Not difficult at all Not difficult at all Not difficult at all  PHQ-2/9 Result is neg  Fall Risk: Fall Risk  08/13/2020 11/26/2019 11/19/2019 11/11/2019 10/15/2019  Falls in the past year? 0 0 0 0 0  Number falls in past yr: 0 0 0 0 0  Injury with Fall? 0 0 1 0 -  Follow up Falls evaluation completed Falls evaluation completed - Falls evaluation completed Falls evaluation completed      Objective:   Vitals:   08/13/20 1314  BP: 108/68  Pulse: 87  Resp: 16  Temp: 98 F (36.7 C)  TempSrc: Oral  SpO2: 99%  Weight: 200 lb (90.7 kg)  Height: 5\' 9"  (1.753 m)    Body mass index is 29.53 kg/m.  Physical Exam   NAD, masked, pleasant HEENT - [place]/AT, sclera anicteric,  Neuro/psychiatric - affect was not flat, appropriate with conversation  Alert with normal speech    Results for orders placed or performed in visit on 11/19/19  Novel Coronavirus, NAA ([facility])   Specimen: Nasopharyngeal(NP) swabs in vial transport medium   NASOPHARYNGE  TESTING  Result Value Ref Range   SARS-CoV-2, NAA Not Detected Not Detected       Assessment & Plan:    1. Overweight (BMI 25.0-29.9) Noted with her BMI less than 30, technically she falls into the overweight category. Completed the form for her noting on the form that her BMI today was 29.53, less than 30, and she has had some success losing some weight here in the very recent past. Also noted she will continue to try to lose weight with a weight program which includes dietary modifications and trying to be more physically active as we discussed today. Copy of the form will be made for the chart, and she will send the form back as instructed for review.  She will follow-up if there are more concerns with this prior we can be more helpful in the near future.       Towanda Malkin, MD 08/13/20 1:34 PM

## 2020-09-06 ENCOUNTER — Encounter: Payer: Self-pay | Admitting: Psychiatry

## 2020-09-06 ENCOUNTER — Telehealth (INDEPENDENT_AMBULATORY_CARE_PROVIDER_SITE_OTHER): Payer: 59 | Admitting: Psychiatry

## 2020-09-06 ENCOUNTER — Other Ambulatory Visit: Payer: Self-pay

## 2020-09-06 DIAGNOSIS — F5105 Insomnia due to other mental disorder: Secondary | ICD-10-CM

## 2020-09-06 DIAGNOSIS — Z79899 Other long term (current) drug therapy: Secondary | ICD-10-CM | POA: Diagnosis not present

## 2020-09-06 DIAGNOSIS — F99 Mental disorder, not otherwise specified: Secondary | ICD-10-CM

## 2020-09-06 DIAGNOSIS — F3176 Bipolar disorder, in full remission, most recent episode depressed: Secondary | ICD-10-CM

## 2020-09-06 NOTE — Progress Notes (Signed)
Virtual Visit via Telephone Note  I connected with Barbara [name] on 09/06/20 at  4:30 PM EDT by telephone and verified that I am speaking with the correct person using two identifiers.  Location Provider Location : [facility] Patient Location : Work  Participants: Patient , Provider   I discussed the limitations, risks, security and privacy concerns of performing an evaluation and management service by telephone and the availability of in person appointments. I also discussed with the patient that there may be a patient responsible charge related to this service. The patient expressed understanding and agreed to proceed.    I discussed the assessment and treatment plan with the patient. The patient was provided an opportunity to ask questions and all were answered. The patient agreed with the plan and demonstrated an understanding of the instructions.   The patient was advised to call back or seek an in-person evaluation if the symptoms worsen or if the condition fails to improve as anticipated.  [place] MD OP Progress Note  09/06/2020 4:43 PM Barbara [name]  MRN:  [id number]  Chief Complaint:  Chief Complaint    Follow-up     HPI: Barbara [name] is a 54 year old Caucasian female, lives in [place], employed, divorced, has a history of bipolar disorder, was evaluated by phone today.  Patient preferred to do a phone call.  Patient today reports she is currently doing well.  She denies any significant mood swings.  She is compliant on medications and denies side effects.  She reports sleep and appetite is fair.  Patient denies any suicidality, homicidality or perceptual disturbances.  Patient today reports she was able to get her Depakote labs done and can fax the report to [place].   Patient denies any other concerns today.  Visit Diagnosis:    ICD-10-CM   1. Bipolar disorder, in full remission, most recent episode depressed ([place])  F31.76   2. Insomnia due to other mental disorder   F51.05    F99   3. High risk medication use  Z79.899     Past Psychiatric History: I have reviewed past psychiatric history from my progress note on 02/12/2018.  Past Medical History:  Past Medical History:  Diagnosis Date  . Acid reflux   . Adnexal mass   . Bipolar disorder ([place])   . Depression   . Depression   . Herpes   . Irregular menstrual bleeding   . Melanoma [facility])     Past Surgical History:  Procedure Laterality Date  . CERVICAL BIOPSY     Dr. Kenton Kingfisher with [facility]  . COLONOSCOPY    . COLONOSCOPY    . DILATION AND CURETTAGE OF UTERUS    . MELANOMA EXCISION    . TONSILLECTOMY      Family Psychiatric History: I have reviewed family psychiatric history from my progress note on 02/12/2018.  Family History:  Family History  Problem Relation Age of Onset  . Heart attack Father   . Cancer Father        melanoma  . Cancer Maternal Aunt        breast  . Breast cancer Maternal Aunt   . Cancer Paternal Aunt        breast  . Breast cancer Paternal Aunt   . Breast cancer Cousin     Social History: I have reviewed social history from my progress note on 02/12/2018. Social History   Socioeconomic History  . Marital status: Divorced    Spouse name: Not on  file  . Number of children: 1  . Years of education: Not on file  . Highest education level: Associate degree: occupational, Hotel manager, or vocational program  Occupational History    Comment: full time  Tobacco Use  . Smoking status: Former Smoker    Types: Cigarettes    Quit date: 02/12/1990    Years since quitting: 30.5  . Smokeless tobacco: Never Used  Vaping Use  . Vaping Use: Never used  Substance and Sexual Activity  . Alcohol use: No    Alcohol/week: 0.0 standard drinks  . Drug use: No  . Sexual activity: Not Currently  Other Topics Concern  . Not on file  Social History Narrative  . Not on file   Social Determinants of Health   Financial Resource Strain:   . Difficulty of Paying Living  Expenses: Not on file  Food Insecurity:   . Worried About Charity fundraiser in the Last Year: Not on file  . Ran Out of Food in the Last Year: Not on file  Transportation Needs:   . Lack of Transportation (Medical): Not on file  . Lack of Transportation (Non-Medical): Not on file  Physical Activity:   . Days of Exercise per Week: Not on file  . Minutes of Exercise per Session: Not on file  Stress:   . Feeling of Stress : Not on file  Social Connections:   . Frequency of Communication with Friends and Family: Not on file  . Frequency of Social Gatherings with Friends and Family: Not on file  . Attends Religious Services: Not on file  . Active Member of Clubs or Organizations: Not on file  . Attends Archivist Meetings: Not on file  . Marital Status: Not on file    Allergies:  Allergies  Allergen Reactions  . Penicillins Hives and Other (See Comments)    delusional  . Sulfa Antibiotics Hives and Other (See Comments)    dellusional  . Prednisone Other (See Comments)    Heart racing    Metabolic Disorder Labs: Lab Results  Component Value Date   HGBA1C 5.3 08/25/2019   Lab Results  Component Value Date   PROLACTIN 8.2 08/25/2019   PROLACTIN 11.6 05/17/2018   Lab Results  Component Value Date   CHOL 222 (H) 08/25/2019   TRIG 143 08/25/2019   HDL 56 08/25/2019   CHOLHDL 4.0 08/25/2019   LDLCALC 141 (H) 08/25/2019   LDLCALC 155 (H) 05/17/2018   Lab Results  Component Value Date   TSH 4.030 08/17/2017   TSH 2.770 10/14/2015    Therapeutic Level Labs: No results found for: LITHIUM Lab Results  Component Value Date   VALPROATE 84 08/25/2019   VALPROATE 87 05/17/2018   No components found for:  CBMZ  Current Medications: Current Outpatient Medications  Medication Sig Dispense Refill  . acetaminophen (TYLENOL) 650 MG CR tablet Take by mouth.    Marland Kitchen albuterol (VENTOLIN HFA) 108 (90 Base) MCG/ACT inhaler Inhale 2 puffs into the lungs every 4 (four)  hours as needed for wheezing or shortness of breath. 18 g 1  . Ascorbic Acid (VITAMIN C) 1000 MG tablet Take 2,000 mg by mouth daily.    Marland Kitchen azelastine (ASTELIN) 0.1 % nasal spray PLACE 1 SPRAY INTO BOTH NOSTRILS 2 (TWO) TIMES DAILY    . benzonatate (TESSALON) 100 MG capsule Take 1 capsule (100 mg total) by mouth 3 (three) times daily as needed for cough. 30 capsule 0  . Black Elderberry 50  MG/5ML SYRP Take by mouth.    . Cholecalciferol (VITAMIN D-1000 MAX ST) 25 MCG (1000 UT) tablet Take by mouth.    . diazepam (VALIUM) 5 MG tablet Take 1 tablet (5 mg total) by mouth every 8 (eight) hours as needed for muscle spasms. 20 tablet 0  . divalproex (DEPAKOTE ER) 500 MG 24 hr tablet Take 2 tablets (1,000 mg total) by mouth daily. 180 tablet 1  . doxycycline (VIBRAMYCIN) 100 MG capsule Take 1 capsule (100 mg total) by mouth 2 (two) times daily. 20 capsule 0  . Lurasidone HCl 60 MG TABS Take 1 tablet (60 mg total) by mouth daily with supper. 90 tablet 1  . methylPREDNISolone (MEDROL DOSEPAK) 4 MG TBPK tablet Take as directed.  6 tabs po on day 1, 5 tabs po day 2... 1 tab po day 6 (Patient not taking: Reported on 11/26/2019) 21 tablet 0  . neomycin-polymyxin b-dexamethasone (MAXITROL) 3.5-10000-0.1 SUSP INSTILL 1 DROP INTO LEFT EYE 4 TIMES A DAY  0  . pimecrolimus (ELIDEL) 1 % cream Apply topically.    . traZODone (DESYREL) 100 MG tablet Take 1 tablet at bedtime 135 tablet 1  . triamcinolone cream (KENALOG) 0.1 % APPLY TO AFFECTED AREA TWICE A DAY     No current facility-administered medications for this visit.     Musculoskeletal: Strength & Muscle Tone: UTA Gait & Station: UTA Patient leans: N/A  Psychiatric Specialty Exam: Review of Systems  Psychiatric/Behavioral: Negative for agitation, behavioral problems, confusion, decreased concentration, dysphoric mood, hallucinations, self-injury, sleep disturbance and suicidal ideas. The patient is not nervous/anxious and is not hyperactive.   All other  systems reviewed and are negative.   There were no vitals taken for this visit.There is no height or weight on file to calculate BMI.  General Appearance: UTA  Eye Contact:  UTA  Speech:  Normal Rate  Volume:  Normal  Mood:  Euthymic  Affect:  UTA  Thought Process:  Goal Directed and Descriptions of Associations: Intact  Orientation:  Full (Time, Place, and Person)  Thought Content: Logical   Suicidal Thoughts:  No  Homicidal Thoughts:  No  Memory:  Immediate;   Fair Recent;   Fair Remote;   Fair  Judgement:  Fair  Insight:  Fair  Psychomotor Activity:  UTA  Concentration:  Concentration: Fair and Attention Span: Fair  Recall:  [employer] of Knowledge: Fair  Language: Fair  Akathisia:  No  Handed:  Right  AIMS (if indicated): UTA  Assets:  Communication Skills Desire for [place] Talents/Skills  ADL's:  Intact  Cognition: WNL  Sleep:  Fair   Screenings: AIMS     Office Visit from 02/12/2018 in [place] Total Score 0    PHQ2-9     Office Visit from 08/13/2020 in [facility] Office Visit from 11/26/2019 in [facility] Office Visit from 11/19/2019 in [facility] Office Visit from 11/11/2019 in [facility] Office Visit from 10/15/2019 in [facility]  PHQ-2 Total Score 0 0 0 0 0  PHQ-9 Total Score -- 0 0 0 0       Assessment and Plan:  Barbara [name] is a 54 year old Caucasian female, employed, divorced, lives in [place], has a history of bipolar disorder was evaluated by phone today.  Patient is currently stable.  Plan as noted below.  Plan Bipolar disorder in remission Depakote ER 1000 mg at bedtime  Latuda 60 mg p.o. daily with meals.  Insomnia-stable Trazodone 150 mg p.o. nightly Patient also has hydroxyzine as needed at bedtime.  High risk medication use-last Depakote level on 08/25/2019  20-84-therapeutic.  Patient was advised to get labs-Depakote level, CMP, CBC, prolactin, hemoglobin A1c, TSH done.  Pending.  Patient agrees to fax report to [place].  Provided fax number for clinic.  Follow-up in clinic in 3 months or sooner if needed.  I have spent atleast 12 minutes non face to face  with patient today. More than 50 % of the time was spent for preparing to see the patient ( e.g., review of test, records ),  ordering medications and test ,psychoeducation and supportive psychotherapy and care coordination,as well as documenting clinical information in electronic health record. This note was generated in part or whole with voice recognition software. Voice recognition is usually quite accurate but there are transcription errors that can and very often do occur. I apologize for any typographical errors that were not detected and corrected.        Ursula Alert, MD 09/07/2020, 8:31 AM

## 2020-09-15 ENCOUNTER — Other Ambulatory Visit: Payer: Self-pay | Admitting: Obstetrics & Gynecology

## 2020-09-15 DIAGNOSIS — Z1231 Encounter for screening mammogram for malignant neoplasm of breast: Secondary | ICD-10-CM

## 2020-10-27 ENCOUNTER — Encounter: Payer: Self-pay | Admitting: Obstetrics & Gynecology

## 2020-10-27 ENCOUNTER — Other Ambulatory Visit: Payer: Self-pay

## 2020-10-27 ENCOUNTER — Ambulatory Visit (INDEPENDENT_AMBULATORY_CARE_PROVIDER_SITE_OTHER): Payer: Managed Care, Other (non HMO) | Admitting: Obstetrics & Gynecology

## 2020-10-27 VITALS — BP 120/70 | Ht 69.0 in | Wt 200.0 lb

## 2020-10-27 DIAGNOSIS — Z1211 Encounter for screening for malignant neoplasm of colon: Secondary | ICD-10-CM

## 2020-10-27 DIAGNOSIS — Z01419 Encounter for gynecological examination (general) (routine) without abnormal findings: Secondary | ICD-10-CM | POA: Diagnosis not present

## 2020-10-27 MED ORDER — TOLTERODINE TARTRATE ER 2 MG PO CP24: 2.0000 mg | ORAL_CAPSULE | Freq: Every day | ORAL | 11 refills | Status: DC

## 2020-10-27 NOTE — Patient Instructions (Signed)
Tolterodine tablets What is this medicine? TOLTERODINE (tole TER a deen) is used to treat overactive bladder. This medicine reduces the amount of bathroom visits. It may also help to control wetting accidents. This medicine may be used for other purposes; ask your health care provider or pharmacist if you have questions. COMMON BRAND NAME(S): Detrol What should I tell my health care provider before I take this medicine? They need to know if you have any of these conditions:  difficulty passing urine  glaucoma  intestinal obstruction  irregular heartbeat or you have a family member with irregular heartbeat  kidney disease  liver disease  myasthenia gravis  an unusual or allergic reaction to tolterodine, fesoterodine, other medicines, foods, dyes, or preservatives  pregnant or trying to get pregnant  breast-feeding How should I use this medicine? Take this medicine by mouth with a glass of water. Follow the directions on the prescription label. Take your doses at regular intervals. Do not take your medicine more often than directed. Talk to your pediatrician regarding the use of this medicine in children. Special care may be needed. Overdosage: If you think you have taken too much of this medicine contact a poison control center or emergency room at once. NOTE: This medicine is only for you. Do not share this medicine with others. What if I miss a dose? If you miss a dose, take it as soon as you can. If it is almost time for your next dose, take only that dose. Do not take double or extra doses. What may interact with this medicine?  clarithromycin  cyclosporine  erythromycin  fluoxetine  medicines for fungal infections, like fluconazole, itraconazole, ketoconazole or voriconazole  vinblastine This list may not describe all possible interactions. Give your health care provider a list of all the medicines, herbs, non-prescription drugs, or dietary supplements you use. Also  tell them if you smoke, drink alcohol, or use illegal drugs. Some items may interact with your medicine. What should I watch for while using this medicine? It may take 2 or 3 months to notice the full benefit from this medicine. You may need to limit your intake tea, coffee, caffeinated sodas, and alcohol. These drinks may make your symptoms worse. You may get drowsy or dizzy. Do not drive, use machinery, or do anything that needs mental alertness until you know how this drug affects you. Do not stand or sit up quickly, especially if you are an older patient. This reduces the risk of dizzy or fainting spells. Your mouth may get dry. Chewing sugarless gum or sucking hard candy, and drinking plenty of water may help. Contact your doctor if the problem does not go away or is severe. This medicine may cause dry eyes and blurred vision. If you wear contact lenses you may feel some discomfort. Lubricating drops may help. See your eye doctor if the problem does not go away or is severe. Avoid extreme heat. This medicine can cause you to sweat less than normal. Your body temperature could increase to dangerous levels, which may lead to heat stroke. What side effects may I notice from receiving this medicine? Side effects that you should report to your doctor or health care professional as soon as possible:  allergic reactions like skin rash, itching or hives, swelling of the face, lips, or tongue  breathing problems  confusion  difficulty passing urine  fast, irregular heartbeat  hallucinations  swelling in feet, hands Side effects that usually do not require medical attention (report to  your doctor or health care professional if they continue or are bothersome):  changes in vision  constipation  dry eyes, mouth  headache  dizziness, drowsiness  stomach upset This list may not describe all possible side effects. Call your doctor for medical advice about side effects. You may report side  effects to FDA at 1-800-FDA-1088. Where should I keep my medicine? Keep out of the reach of children. Store at room temperature between 15 and 30 degrees C (59 and 86 degrees F). Throw away any unused medicine after the expiration date. NOTE: This sheet is a summary. It may not cover all possible information. If you have questions about this medicine, talk to your doctor, pharmacist, or health care provider.  2020 Elsevier/Gold Standard (2010-08-09 17:19:08)

## 2020-10-27 NOTE — Progress Notes (Signed)
HPI:      [name] is a 54 y.o. G1P1001 who LMP was in the past, she presents today for her annual examination.  The patient has no complaints today other than urinary urgency, frequency, nocturia (2-3 times) and stress incontinence (mild, but bothersome). The patient is sexually active. Herlast pap: approximate date 2019 and was normal and last mammogram: approximate date 2021 and was normal.  The patient does perform self breast exams.  There is notable family history of breast or ovarian cancer in her family. The patient is not taking hormone replacement therapy. Patient denies post-menopausal vaginal bleeding.   The patient has regular exercise: yes. The patient denies current symptoms of depression.    GYN Hx: Last Colonoscopy:2 years ago. Normal.  Last DEXA: never ago.    PMHx: Past Medical History:  Diagnosis Date  . Acid reflux   . Adnexal mass   . Bipolar disorder ([place])   . Depression   . Depression   . Herpes   . Irregular menstrual bleeding   . Melanoma [facility])    Past Surgical History:  Procedure Laterality Date  . CERVICAL BIOPSY     Dr. Kenton Kingfisher with [facility]  . COLONOSCOPY    . COLONOSCOPY    . DILATION AND CURETTAGE OF UTERUS    . MELANOMA EXCISION    . TONSILLECTOMY     Family History  Problem Relation Age of Onset  . Heart attack Father   . Cancer Father        melanoma  . Cancer Maternal Aunt        breast  . Breast cancer Maternal Aunt   . Cancer Paternal Aunt        breast  . Breast cancer Paternal Aunt   . Breast cancer Cousin    Social History   Tobacco Use  . Smoking status: Former Smoker    Types: Cigarettes    Quit date: 02/12/1990    Years since quitting: 30.7  . Smokeless tobacco: Never Used  Vaping Use  . Vaping Use: Never used  Substance Use Topics  . Alcohol use: No    Alcohol/week: 0.0 standard drinks  . Drug use: No    Current Outpatient Medications:  .  acetaminophen (TYLENOL) 650 MG CR tablet, Take by mouth.,  Disp: , Rfl:  .  albuterol (VENTOLIN HFA) 108 (90 Base) MCG/ACT inhaler, Inhale 2 puffs into the lungs every 4 (four) hours as needed for wheezing or shortness of breath., Disp: 18 g, Rfl: 1 .  Ascorbic Acid (VITAMIN C) 1000 MG tablet, Take 2,000 mg by mouth daily., Disp: , Rfl:  .  azelastine (ASTELIN) 0.1 % nasal spray, PLACE 1 SPRAY INTO BOTH NOSTRILS 2 (TWO) TIMES DAILY, Disp: , Rfl:  .  benzonatate (TESSALON) 100 MG capsule, Take 1 capsule (100 mg total) by mouth 3 (three) times daily as needed for cough., Disp: 30 capsule, Rfl: 0 .  Black Elderberry 50 MG/5ML SYRP, Take by mouth., Disp: , Rfl:  .  Cholecalciferol 25 MCG (1000 UT) tablet, Take by mouth., Disp: , Rfl:  .  diazepam (VALIUM) 5 MG tablet, Take 1 tablet (5 mg total) by mouth every 8 (eight) hours as needed for muscle spasms., Disp: 20 tablet, Rfl: 0 .  divalproex (DEPAKOTE ER) 500 MG 24 hr tablet, Take 2 tablets (1,000 mg total) by mouth daily., Disp: 180 tablet, Rfl: 1 .  doxycycline (VIBRAMYCIN) 100 MG capsule, Take 1 capsule (100 mg total) by  mouth 2 (two) times daily., Disp: 20 capsule, Rfl: 0 .  Lurasidone HCl 60 MG TABS, Take 1 tablet (60 mg total) by mouth daily with supper., Disp: 90 tablet, Rfl: 1 .  methylPREDNISolone (MEDROL DOSEPAK) 4 MG TBPK tablet, Take as directed.  6 tabs po on day 1, 5 tabs po day 2... 1 tab po day 6 (Patient taking differently: Take as directed.  6 tabs po on day 1, 5 tabs po day 2... 1 tab po day 6), Disp: 21 tablet, Rfl: 0 .  neomycin-polymyxin b-dexamethasone (MAXITROL) 3.5-10000-0.1 SUSP, INSTILL 1 DROP INTO LEFT EYE 4 TIMES A DAY, Disp: , Rfl: 0 .  pimecrolimus (ELIDEL) 1 % cream, Apply topically., Disp: , Rfl:  .  traZODone (DESYREL) 100 MG tablet, Take 1 tablet at bedtime, Disp: 135 tablet, Rfl: 1 .  triamcinolone cream (KENALOG) 0.1 %, APPLY TO AFFECTED AREA TWICE A DAY, Disp: , Rfl:  .  tolterodine (DETROL LA) 2 MG 24 hr capsule, Take 1 capsule (2 mg total) by mouth daily., Disp: 30  capsule, Rfl: 11 Allergies: Penicillins, Sulfa antibiotics, and Prednisone  Review of Systems  Constitutional: Negative for chills, fever and malaise/fatigue.  HENT: Negative for congestion, sinus pain and sore throat.   Eyes: Negative for blurred vision and pain.  Respiratory: Negative for cough and wheezing.   Cardiovascular: Negative for chest pain and leg swelling.  Gastrointestinal: Negative for abdominal pain, constipation, diarrhea, heartburn, nausea and vomiting.  Genitourinary: Positive for frequency and urgency. Negative for dysuria and hematuria.  Musculoskeletal: Negative for back pain, joint pain, myalgias and neck pain.  Skin: Negative for itching and rash.  Neurological: Negative for dizziness, tremors and weakness.  Endo/Heme/Allergies: Does not bruise/bleed easily.  Psychiatric/Behavioral: Negative for depression. The patient is not nervous/anxious and does not have insomnia.     Objective: BP 120/70   Ht 5\' 9"  (1.753 m)   Wt 200 lb (90.7 kg)   BMI 29.53 kg/m   Filed Weights   10/27/20 1530  Weight: 200 lb (90.7 kg)   Body mass index is 29.53 kg/m. Physical Exam Constitutional:      General: She is not in acute distress.    Appearance: She is well-developed and well-nourished.  Genitourinary:     Bladder, vagina, uterus, rectum and urethral meatus normal.     There is no rash or lesion on the right labia.     There is no rash or lesion on the left labia.    No lesions in the vagina.     No vaginal bleeding.      Right Adnexa: not tender and no mass present.    Left Adnexa: not tender and no mass present.    No cervical motion tenderness, friability, lesion or polyp.     Uterus is mobile.     Uterus is not enlarged.     No uterine mass detected.    Uterus exam comments: Small mobile min prolapse.     Uterus is midaxial.     No urethral tenderness or mass present.     Bladder exam comments: Min cystocele.     Pelvic exam was performed with patient  supine.  Breasts:     Right: No mass, skin change or tenderness.     Left: No mass, skin change or tenderness.    HENT:     Head: Normocephalic and atraumatic. No laceration.     Right Ear: Hearing normal.     Left Ear: Hearing normal.  Nose: No epistaxis or foreign body.     Mouth/Throat:     Mouth: Oropharynx is clear and moist and mucous membranes are normal.     Pharynx: Uvula midline.  Eyes:     Pupils: Pupils are equal, round, and reactive to light.  Neck:     Thyroid: No thyromegaly.  Cardiovascular:     Rate and Rhythm: Normal rate and regular rhythm.     Heart sounds: No murmur heard. No friction rub. No gallop.   Pulmonary:     Effort: Pulmonary effort is normal. No respiratory distress.     Breath sounds: Normal breath sounds. No wheezing.  Abdominal:     General: Bowel sounds are normal. There is no distension.     Palpations: Abdomen is soft.     Tenderness: There is no abdominal tenderness. There is no rebound.  Musculoskeletal:        General: Normal range of motion.     Cervical back: Normal range of motion and neck supple.  Neurological:     Mental Status: She is alert and oriented to person, place, and time.     Cranial Nerves: No cranial nerve deficit.  Skin:    General: Skin is warm and dry.  Psychiatric:        Mood and Affect: Mood and affect normal.        Judgment: Judgment normal.  Vitals reviewed.     Assessment: Annual Exam 1. Women's annual routine gynecological examination   2. Screen for colon cancer     Plan:            1.  Cervical Screening-  Pap smear schedule reviewed with patient  2. Breast screening- Exam annually and mammogram scheduled  3. Colonoscopy every 10 years, Hemoccult testing after age 8  4. Labs managed by PCP  5. Counseling for hormonal therapy: none              6. FRAX - FRAX score for assessing the 10 year probability for fracture calculated and discussed today.  Based on age and score today, DEXA is  not currently scheduled.   7. Mixed Urinary Incontinence Plan Detrol LA for DI component to address first. Cystocele mild and mow likelihood for help w sling or pessary    F/U  Return in about 2 months (around 12/28/2020) for Follow up virtual.  Barnett Applebaum, MD, [facility], [facility] 10/27/2020  3:57 PM

## 2020-12-06 ENCOUNTER — Other Ambulatory Visit: Payer: Self-pay | Admitting: Psychiatry

## 2020-12-06 DIAGNOSIS — F3176 Bipolar disorder, in full remission, most recent episode depressed: Secondary | ICD-10-CM

## 2020-12-09 ENCOUNTER — Telehealth: Payer: Self-pay | Admitting: Psychiatry

## 2020-12-09 ENCOUNTER — Other Ambulatory Visit: Payer: Self-pay

## 2020-12-09 ENCOUNTER — Telehealth (INDEPENDENT_AMBULATORY_CARE_PROVIDER_SITE_OTHER): Payer: 59 | Admitting: Psychiatry

## 2020-12-09 ENCOUNTER — Encounter: Payer: Self-pay | Admitting: Psychiatry

## 2020-12-09 DIAGNOSIS — F5105 Insomnia due to other mental disorder: Secondary | ICD-10-CM

## 2020-12-09 DIAGNOSIS — Z79899 Other long term (current) drug therapy: Secondary | ICD-10-CM

## 2020-12-09 DIAGNOSIS — F99 Mental disorder, not otherwise specified: Secondary | ICD-10-CM | POA: Diagnosis not present

## 2020-12-09 DIAGNOSIS — F3176 Bipolar disorder, in full remission, most recent episode depressed: Secondary | ICD-10-CM

## 2020-12-09 MED ORDER — TRAZODONE HCL 100 MG PO TABS
ORAL_TABLET | ORAL | 1 refills | Status: DC
Start: 2020-12-09 — End: 2021-07-04

## 2020-12-09 NOTE — Telephone Encounter (Signed)
Please mail lab slip for Depakote level, TSH, prolactin, CMP, CBC, lipid panel, hemoglobin A1c for this patient.  In your mailbox.

## 2020-12-09 NOTE — Progress Notes (Addendum)
Virtual Visit via Telephone Note  I connected with Barbara [name] on 12/09/20 at  4:30 PM EST by telephone and verified that I am speaking with the correct person using two identifiers.  Location Provider Location : [facility] Patient Location : Work  Participants: Patient , Provider    I discussed the limitations, risks, security and privacy concerns of performing an evaluation and management service by telephone and the availability of in person appointments. I also discussed with the patient that there may be a patient responsible charge related to this service. The patient expressed understanding and agreed to proceed.   I discussed the assessment and treatment plan with the patient. The patient was provided an opportunity to ask questions and all were answered. The patient agreed with the plan and demonstrated an understanding of the instructions.   The patient was advised to call back or seek an in-person evaluation if the symptoms worsen or if the condition fails to improve as anticipated.    Tripp MD OP Progress Note  12/09/2020 5:20 PM Barbara [name]  MRN:  [id number]  Chief Complaint:  Chief Complaint    Follow-up     HPI: Barbara [name] is a 55 year old Caucasian female, lives in [place], employed, divorced, has a history of bipolar disorder was evaluated by telemedicine today.  Patient today reports she is currently doing well.  She denies any depression, manic symptoms.  She reports sleep as good.  She denies any appetite changes.  She reports the holidays went well for her.  Patient denies any suicidality, homicidality or perceptual disturbances.  Patient reports she is compliant on her medications and denies side effects.  Patient denies any other concerns today.  Visit Diagnosis:    ICD-10-CM   1. Bipolar disorder, in full remission, most recent episode depressed ([place])  F31.76   2. Insomnia due to other mental disorder  F51.05 traZODone (DESYREL) 100 MG  tablet   F99   3. High risk medication use  Z79.899     Past Psychiatric History: I have reviewed past psychiatric history from my progress note on 02/12/2018.  Past Medical History:  Past Medical History:  Diagnosis Date  . Acid reflux   . Adnexal mass   . Bipolar disorder ([employer])   . Depression   . Depression   . Herpes   . Irregular menstrual bleeding   . Melanoma [facility])     Past Surgical History:  Procedure Laterality Date  . CERVICAL BIOPSY     Dr. Kenton Kingfisher with [facility]  . COLONOSCOPY    . COLONOSCOPY    . DILATION AND CURETTAGE OF UTERUS    . MELANOMA EXCISION    . TONSILLECTOMY      Family Psychiatric History: I have reviewed family psychiatric history from my progress note on 02/12/2018.  Family History:  Family History  Problem Relation Age of Onset  . Heart attack Father   . Cancer Father        melanoma  . Cancer Maternal Aunt        breast  . Breast cancer Maternal Aunt   . Cancer Paternal Aunt        breast  . Breast cancer Paternal Aunt   . Breast cancer Cousin     Social History: I have reviewed social history from my progress note on 02/12/2018. Social History   Socioeconomic History  . Marital status: Divorced    Spouse name: Not on file  . Number of children: 1  .  Years of education: Not on file  . Highest education level: Associate degree: occupational, Hotel manager, or vocational program  Occupational History    Comment: full time  Tobacco Use  . Smoking status: Former Smoker    Types: Cigarettes    Quit date: 02/12/1990    Years since quitting: 30.8  . Smokeless tobacco: Never Used  Vaping Use  . Vaping Use: Never used  Substance and Sexual Activity  . Alcohol use: No    Alcohol/week: 0.0 standard drinks  . Drug use: No  . Sexual activity: Not Currently  Other Topics Concern  . Not on file  Social History Narrative  . Not on file   Social Determinants of Health   Financial Resource Strain: Not on file  Food Insecurity: Not on  file  Transportation Needs: Not on file  Physical Activity: Not on file  Stress: Not on file  Social Connections: Not on file    Allergies:  Allergies  Allergen Reactions  . Penicillins Hives and Other (See Comments)    delusional  . Sulfa Antibiotics Hives and Other (See Comments)    dellusional  . Prednisone Other (See Comments)    Heart racing    Metabolic Disorder Labs: Lab Results  Component Value Date   HGBA1C 5.3 08/25/2019   Lab Results  Component Value Date   PROLACTIN 8.2 08/25/2019   PROLACTIN 11.6 05/17/2018   Lab Results  Component Value Date   CHOL 222 (H) 08/25/2019   TRIG 143 08/25/2019   HDL 56 08/25/2019   CHOLHDL 4.0 08/25/2019   LDLCALC 141 (H) 08/25/2019   LDLCALC 155 (H) 05/17/2018   Lab Results  Component Value Date   TSH 4.030 08/17/2017   TSH 2.770 10/14/2015    Therapeutic Level Labs: No results found for: LITHIUM Lab Results  Component Value Date   VALPROATE 84 08/25/2019   VALPROATE 87 05/17/2018   No components found for:  CBMZ  Current Medications: Current Outpatient Medications  Medication Sig Dispense Refill  . acetaminophen (TYLENOL) 650 MG CR tablet Take by mouth.    Marland Kitchen albuterol (VENTOLIN HFA) 108 (90 Base) MCG/ACT inhaler Inhale 2 puffs into the lungs every 4 (four) hours as needed for wheezing or shortness of breath. 18 g 1  . Ascorbic Acid (VITAMIN C) 1000 MG tablet Take 2,000 mg by mouth daily.    Marland Kitchen azelastine (ASTELIN) 0.1 % nasal spray PLACE 1 SPRAY INTO BOTH NOSTRILS 2 (TWO) TIMES DAILY    . benzonatate (TESSALON) 100 MG capsule Take 1 capsule (100 mg total) by mouth 3 (three) times daily as needed for cough. 30 capsule 0  . Black Elderberry 50 MG/5ML SYRP Take by mouth.    . Cholecalciferol 25 MCG (1000 UT) tablet Take by mouth.    . diazepam (VALIUM) 5 MG tablet Take 1 tablet (5 mg total) by mouth every 8 (eight) hours as needed for muscle spasms. 20 tablet 0  . divalproex (DEPAKOTE ER) 500 MG 24 hr tablet TAKE  2 TABLETS BY MOUTH  DAILY 180 tablet 3  . doxycycline (VIBRAMYCIN) 100 MG capsule Take 1 capsule (100 mg total) by mouth 2 (two) times daily. 20 capsule 0  . LATUDA 60 MG TABS TAKE 1 TABLET BY MOUTH  DAILY WITH SUPPER 90 tablet 3  . levofloxacin (LEVAQUIN) 500 MG tablet Take 500 mg by mouth daily.    . methylPREDNISolone (MEDROL DOSEPAK) 4 MG TBPK tablet Take as directed.  6 tabs po on day 1, 5 tabs  po day 2... 1 tab po day 6 (Patient taking differently: Take as directed.  6 tabs po on day 1, 5 tabs po day 2... 1 tab po day 6) 21 tablet 0  . neomycin-polymyxin b-dexamethasone (MAXITROL) 3.5-10000-0.1 SUSP INSTILL 1 DROP INTO LEFT EYE 4 TIMES A DAY  0  . pimecrolimus (ELIDEL) 1 % cream Apply topically.    . tolterodine (DETROL LA) 2 MG 24 hr capsule Take 1 capsule (2 mg total) by mouth daily. 30 capsule 11  . traZODone (DESYREL) 100 MG tablet Take 1 tablet at bedtime 135 tablet 1  . triamcinolone cream (KENALOG) 0.1 % APPLY TO AFFECTED AREA TWICE A DAY     No current facility-administered medications for this visit.     Musculoskeletal: Strength & Muscle Tone: UTA Gait & Station: UTA Patient leans: N/A  Psychiatric Specialty Exam: Review of Systems  Psychiatric/Behavioral: Negative for agitation, behavioral problems, confusion, decreased concentration, dysphoric mood, hallucinations, self-injury, sleep disturbance and suicidal ideas. The patient is not nervous/anxious and is not hyperactive.   All other systems reviewed and are negative.   There were no vitals taken for this visit.There is no height or weight on file to calculate BMI.  General Appearance: UTA  Eye Contact:  UTA  Speech:  Clear and Coherent  Volume:  Normal  Mood:  Euthymic  Affect:  UTA  Thought Process:  Goal Directed and Descriptions of Associations: Intact  Orientation:  Full (Time, Place, and Person)  Thought Content: Logical   Suicidal Thoughts:  No  Homicidal Thoughts:  No  Memory:  Immediate;    Fair Recent;   Fair Remote;   Fair  Judgement:  Fair  Insight:  Fair  Psychomotor Activity:  UTA  Concentration:  Concentration: Fair and Attention Span: Fair  Recall:  [employer] of Knowledge: Fair  Language: Fair  Akathisia:  No  Handed:  Right  AIMS (if indicated): UTA  Assets:  Communication Skills Desire for [employer] Talents/Skills Transportation Vocational/Educational  ADL's:  Intact  Cognition: WNL  Sleep:  Fair   Screenings: [place] Office Visit from 02/12/2018 in [place] Total Score 0    PHQ2-9   [place] Office Visit from 08/13/2020 in [facility] Office Visit from 11/26/2019 in [facility] Office Visit from 11/19/2019 in [facility] Office Visit from 11/11/2019 in [facility] Office Visit from 10/15/2019 in [facility]  PHQ-2 Total Score 0 0 0 0 0  PHQ-9 Total Score -- 0 0 0 0       Assessment and Plan: Barbara [name] is a 55 year old Caucasian female, employed, divorced, lives in [place], has a history of bipolar disorder was evaluated by telemedicine today.  Patient is currently stable on current medication regimen.  Plan as noted below.  Plan Bipolar disorder in remission Depakote ER 1000 mg at bedtime Depakote level-dated 08/25/2019 -84-therapeutic. Latuda 60 mg p.o. daily with meals.  Insomnia-stable Trazodone 150 mg p.o. nightly Continue hydroxyzine as needed.  High risk medication use-patient has been noncompliant with labs. Will order Depakote level, CMP, CBC, prolactin, hemoglobin A1c, TSH.  Will mail it to her.  Follow-up in clinic in 3 to 4 months or sooner if needed.  I have spent atleast 20 minutes non face to face with patient today. More than 50 % of the time was spent for preparing to see the patient ( e.g.,  review of test, records ), ordering medications and  test ,psychoeducation and supportive psychotherapy and care coordination,as well as documenting clinical information in electronic health record. This note was generated in part or whole with voice recognition software. Voice recognition is usually quite accurate but there are transcription errors that can and very often do occur. I apologize for any typographical errors that were not detected and corrected.         Ursula Alert, MD 12/09/2020, 5:20 PM

## 2020-12-10 NOTE — Telephone Encounter (Signed)
This was mailed.

## 2020-12-17 ENCOUNTER — Other Ambulatory Visit: Payer: Self-pay | Admitting: Psychiatry

## 2020-12-18 LAB — COMPREHENSIVE METABOLIC PANEL
ALT: 13 IU/L (ref 0–32)
AST: 14 IU/L (ref 0–40)
Albumin/Globulin Ratio: 1.8 (ref 1.2–2.2)
Albumin: 4.6 g/dL (ref 3.8–4.9)
Alkaline Phosphatase: 46 IU/L (ref 44–121)
BUN/Creatinine Ratio: 17 (ref 9–23)
BUN: 19 mg/dL (ref 6–24)
Bilirubin Total: <0.2 mg/dL (ref 0.0–1.2)
CO2: 26 mmol/L (ref 20–29)
Calcium: 9.8 mg/dL (ref 8.7–10.2)
Chloride: 102 mmol/L (ref 96–106)
Creatinine, Ser: 1.15 mg/dL — ABNORMAL HIGH (ref 0.57–1.00)
GFR calc Af Amer: 61 mL/min/{1.73_m2} (ref >59)
GFR calc non Af Amer: 53 mL/min/{1.73_m2} — ABNORMAL LOW (ref >59)
Globulin, Total: 2.5 g/dL (ref 1.5–4.5)
Glucose: 82 mg/dL (ref 65–99)
Potassium: 4.5 mmol/L (ref 3.5–5.2)
Sodium: 144 mmol/L (ref 134–144)
Total Protein: 7.1 g/dL (ref 6.0–8.5)

## 2020-12-18 LAB — TSH: TSH: 3.53 u[IU]/mL (ref 0.450–4.500)

## 2020-12-18 LAB — CBC WITH DIFFERENTIAL
Basophils Absolute: 0 10*3/uL (ref 0.0–0.2)
Basos: 1 %
EOS (ABSOLUTE): 0.2 10*3/uL (ref 0.0–0.4)
Eos: 2 %
Hematocrit: 41.6 % (ref 34.0–46.6)
Hemoglobin: 13.9 g/dL (ref 11.1–15.9)
Immature Grans (Abs): 0 10*3/uL (ref 0.0–0.1)
Immature Granulocytes: 0 %
Lymphocytes Absolute: 3 10*3/uL (ref 0.7–3.1)
Lymphs: 43 %
MCH: 29.7 pg (ref 26.6–33.0)
MCHC: 33.4 g/dL (ref 31.5–35.7)
MCV: 89 fL (ref 79–97)
Monocytes Absolute: 0.6 10*3/uL (ref 0.1–0.9)
Monocytes: 8 %
Neutrophils Absolute: 3.2 10*3/uL (ref 1.4–7.0)
Neutrophils: 46 %
RBC: 4.68 x10E6/uL (ref 3.77–5.28)
RDW: 13.4 % (ref 11.7–15.4)
WBC: 6.9 10*3/uL (ref 3.4–10.8)

## 2020-12-18 LAB — VALPROIC ACID LEVEL: Valproic Acid Lvl: 17 ug/mL — ABNORMAL LOW (ref 50–100)

## 2020-12-18 LAB — HEMOGLOBIN A1C
Est. average glucose Bld gHb Est-mCnc: 105 mg/dL
Hgb A1c MFr Bld: 5.3 % (ref 4.8–5.6)

## 2020-12-18 LAB — PROLACTIN: Prolactin: 10.6 ng/mL (ref 4.8–23.3)

## 2020-12-24 ENCOUNTER — Telehealth: Payer: Self-pay | Admitting: Psychiatry

## 2020-12-24 NOTE — Telephone Encounter (Signed)
Contacted patient.  Discussed her labs.  Discussed creatinine high at 1.15. GFR-53-low.   Depakote level-17-subtherapeutic.  However since she is stable will not make any medication readjustment.  Hemoglobin A1c-5.3 TSH-3.5 Prolactin-10.6 All within normal limits.  Patient advised to contact primary care provider for further management of creatinine level as needed.

## 2020-12-29 ENCOUNTER — Telehealth: Payer: Self-pay

## 2020-12-29 NOTE — Telephone Encounter (Signed)
She does not need f/u specifically for her labs but does appear she is due for her annual exam.

## 2020-12-29 NOTE — Telephone Encounter (Signed)
Copied from [facility] [id number]. Topic: Quick Communication - See Telephone Encounter >> Dec 29, 2020 12:08 PM Barbara [name] wrote: CRM for notification. See Telephone encounter for: 12/29/20.pls fu with pt, She wanted her PCP to look at labs ordered by [facility] and see if it is neccesary for her to make an appt as [place] seems to have told her she needs to FU with her primary and she does not think she should. Shared with pt , since PCP not at the office that she does needed to sch a visit as to be established with someone if there are any concerns. She wants the office nurse to give her a call if they feel it is important for her to be seen. She really does not think it is important enough for a OV try wk # 1st at [phone] or 2nd cell at [phone]

## 2021-01-03 NOTE — Telephone Encounter (Signed)
Patient called. She verbalized understanding.

## 2021-01-21 ENCOUNTER — Ambulatory Visit
Admission: RE | Admit: 2021-01-21 | Discharge: 2021-01-21 | Disposition: A | Discharge status: Home / Self Care | Payer: Managed Care, Other (non HMO) | Source: Ambulatory Visit | Attending: Obstetrics & Gynecology | Admitting: Obstetrics & Gynecology

## 2021-01-21 ENCOUNTER — Other Ambulatory Visit: Payer: Self-pay

## 2021-01-21 DIAGNOSIS — Z1231 Encounter for screening mammogram for malignant neoplasm of breast: Secondary | ICD-10-CM | POA: Insufficient documentation

## 2021-03-24 ENCOUNTER — Other Ambulatory Visit: Payer: Self-pay

## 2021-03-24 ENCOUNTER — Telehealth (INDEPENDENT_AMBULATORY_CARE_PROVIDER_SITE_OTHER): Payer: 59 | Admitting: Psychiatry

## 2021-03-24 ENCOUNTER — Encounter: Payer: Self-pay | Admitting: Psychiatry

## 2021-03-24 DIAGNOSIS — F99 Mental disorder, not otherwise specified: Secondary | ICD-10-CM

## 2021-03-24 DIAGNOSIS — F5105 Insomnia due to other mental disorder: Secondary | ICD-10-CM

## 2021-03-24 DIAGNOSIS — F3176 Bipolar disorder, in full remission, most recent episode depressed: Secondary | ICD-10-CM

## 2021-03-24 NOTE — Progress Notes (Signed)
Virtual Visit via Telephone Note  I connected with Barbara [name] on 03/24/21 at  4:40 PM EDT by telephone and verified that I am speaking with the correct person using two identifiers.  Location Provider Location : [facility] Patient Location : Work  Participants: Patient , Provider    I discussed the limitations, risks, security and privacy concerns of performing an evaluation and management service by telephone and the availability of in person appointments. I also discussed with the patient that there may be a patient responsible charge related to this service. The patient expressed understanding and agreed to proceed.    I discussed the assessment and treatment plan with the patient. The patient was provided an opportunity to ask questions and all were answered. The patient agreed with the plan and demonstrated an understanding of the instructions.   The patient was advised to call back or seek an in-person evaluation if the symptoms worsen or if the condition fails to improve as anticipated.   [place] MD OP Progress Note  03/24/2021 5:15 PM CHYAN CARNERO  MRN:  [id number]  Chief Complaint:  Chief Complaint    Follow-up     HPI: Barbara [name] is a 55 year old Caucasian female, lives in [place], employed, divorced, has a history of bipolar disorder was evaluated by telemedicine today.  Patient today reports he is currently doing well.  She reports she is compliant on medications.  Denies any sadness.  Denies any anxiety attacks.  She reports sleep as good.  She reports work is going well.  Patient denies any suicidality, homicidality or perceptual disturbances.  Patient denies any other concerns today.  Visit Diagnosis:    ICD-10-CM   1. Bipolar disorder, in full remission, most recent episode depressed ([place])  F31.76   2. Insomnia due to other mental disorder  F51.05    F99     Past Psychiatric History: I have reviewed past psychiatric history from progress note on  02/12/2018.  Past Medical History:  Past Medical History:  Diagnosis Date  . Acid reflux   . Adnexal mass   . Bipolar disorder ([name])   . Depression   . Depression   . Herpes   . Irregular menstrual bleeding   . Melanoma [facility])     Past Surgical History:  Procedure Laterality Date  . CERVICAL BIOPSY     Dr. Kenton Kingfisher with [facility]  . COLONOSCOPY    . COLONOSCOPY    . DILATION AND CURETTAGE OF UTERUS    . MELANOMA EXCISION    . TONSILLECTOMY      Family Psychiatric History: I have reviewed family psychiatric history from progress note on 02/12/2018.  Family History:  Family History  Problem Relation Age of Onset  . Heart attack Father   . Cancer Father        melanoma  . Cancer Maternal Aunt        breast  . Breast cancer Maternal Aunt   . Cancer Paternal Aunt        breast  . Breast cancer Paternal Aunt   . Breast cancer Cousin     Social History: I have reviewed social history from progress note on 02/12/2018. Social History   Socioeconomic History  . Marital status: Divorced    Spouse name: Not on file  . Number of children: 1  . Years of education: Not on file  . Highest education level: Associate degree: occupational, Hotel manager, or vocational program  Occupational History    Comment:  full time  Tobacco Use  . Smoking status: Former Smoker    Types: Cigarettes    Quit date: 02/12/1990    Years since quitting: 31.1  . Smokeless tobacco: Never Used  Vaping Use  . Vaping Use: Never used  Substance and Sexual Activity  . Alcohol use: No    Alcohol/week: 0.0 standard drinks  . Drug use: No  . Sexual activity: Not Currently  Other Topics Concern  . Not on file  Social History Narrative  . Not on file   Social Determinants of Health   Financial Resource Strain: Not on file  Food Insecurity: Not on file  Transportation Needs: Not on file  Physical Activity: Not on file  Stress: Not on file  Social Connections: Not on file    Allergies:  Allergies   Allergen Reactions  . Penicillins Hives and Other (See Comments)    delusional  . Sulfa Antibiotics Hives and Other (See Comments)    dellusional  . Prednisone Other (See Comments)    Heart racing    Metabolic Disorder Labs: Lab Results  Component Value Date   HGBA1C 5.3 12/17/2020   Lab Results  Component Value Date   PROLACTIN 10.6 12/17/2020   PROLACTIN 8.2 08/25/2019   Lab Results  Component Value Date   CHOL 222 (H) 08/25/2019   TRIG 143 08/25/2019   HDL 56 08/25/2019   CHOLHDL 4.0 08/25/2019   LDLCALC 141 (H) 08/25/2019   LDLCALC 155 (H) 05/17/2018   Lab Results  Component Value Date   TSH 3.530 12/17/2020   TSH 4.030 08/17/2017    Therapeutic Level Labs: No results found for: LITHIUM Lab Results  Component Value Date   VALPROATE 17 (L) 12/17/2020   VALPROATE 84 08/25/2019   No components found for:  CBMZ  Current Medications: Current Outpatient Medications  Medication Sig Dispense Refill  . acetaminophen (TYLENOL) 650 MG CR tablet Take by mouth.    Marland Kitchen albuterol (VENTOLIN HFA) 108 (90 Base) MCG/ACT inhaler Inhale 2 puffs into the lungs every 4 (four) hours as needed for wheezing or shortness of breath. 18 g 1  . Ascorbic Acid (VITAMIN C) 1000 MG tablet Take 2,000 mg by mouth daily.    Marland Kitchen azelastine (ASTELIN) 0.1 % nasal spray PLACE 1 SPRAY INTO BOTH NOSTRILS 2 (TWO) TIMES DAILY    . benzonatate (TESSALON) 100 MG capsule Take 1 capsule (100 mg total) by mouth 3 (three) times daily as needed for cough. 30 capsule 0  . Black Elderberry 50 MG/5ML SYRP Take by mouth.    . Cholecalciferol 25 MCG (1000 UT) tablet Take by mouth.    . diazepam (VALIUM) 5 MG tablet Take 1 tablet (5 mg total) by mouth every 8 (eight) hours as needed for muscle spasms. 20 tablet 0  . divalproex (DEPAKOTE ER) 500 MG 24 hr tablet TAKE 2 TABLETS BY MOUTH  DAILY 180 tablet 3  . doxycycline (VIBRAMYCIN) 100 MG capsule Take 1 capsule (100 mg total) by mouth 2 (two) times daily. 20 capsule 0   . LATUDA 60 MG TABS TAKE 1 TABLET BY MOUTH  DAILY WITH SUPPER 90 tablet 3  . levofloxacin (LEVAQUIN) 500 MG tablet Take 500 mg by mouth daily.    . methylPREDNISolone (MEDROL DOSEPAK) 4 MG TBPK tablet Take as directed.  6 tabs po on day 1, 5 tabs po day 2... 1 tab po day 6 (Patient taking differently: Take as directed.  6 tabs po on day 1, 5 tabs po  day 2... 1 tab po day 6) 21 tablet 0  . neomycin-polymyxin b-dexamethasone (MAXITROL) 3.5-10000-0.1 SUSP INSTILL 1 DROP INTO LEFT EYE 4 TIMES A DAY  0  . pimecrolimus (ELIDEL) 1 % cream Apply topically.    . tolterodine (DETROL LA) 2 MG 24 hr capsule Take 1 capsule (2 mg total) by mouth daily. 30 capsule 11  . traZODone (DESYREL) 100 MG tablet Take 1 tablet at bedtime 135 tablet 1  . triamcinolone cream (KENALOG) 0.1 % APPLY TO AFFECTED AREA TWICE A DAY     No current facility-administered medications for this visit.     Musculoskeletal: Strength & Muscle Tone: UTA Gait & Station: UTA Patient leans: N/A  Psychiatric Specialty Exam: Review of Systems  Psychiatric/Behavioral: Negative for agitation, behavioral problems, confusion, decreased concentration, dysphoric mood, hallucinations, self-injury, sleep disturbance and suicidal ideas. The patient is not nervous/anxious and is not hyperactive.   All other systems reviewed and are negative.   There were no vitals taken for this visit.There is no height or weight on file to calculate BMI.  General Appearance: UTA  Eye Contact:  UTA  Speech:  Clear and Coherent  Volume:  Normal  Mood:  Euthymic  Affect:  UTA  Thought Process:  Goal Directed and Descriptions of Associations: Intact  Orientation:  Full (Time, Place, and Person)  Thought Content: Logical   Suicidal Thoughts:  No  Homicidal Thoughts:  No  Memory:  Immediate;   Fair Recent;   Fair Remote;   Fair  Judgement:  Fair  Insight:  Fair  Psychomotor Activity:  UTA  Concentration:  Concentration: Fair and Attention Span: Fair   Recall:  [employer] of Knowledge: Fair  Language: Fair  Akathisia:  No  Handed:  Right  AIMS (if indicated): UTA  Assets:  Communication Skills Desire for Improvement Housing Social Support  ADL's:  Intact  Cognition: WNL  Sleep:  Fair   Screenings: [place] Office Visit from 02/12/2018 in [place] Total Score 0    GAD-7   Flowsheet Row Video Visit from 03/24/2021 in [facility]  Total GAD-7 Score 2    PHQ2-9   Flowsheet Row Video Visit from 03/24/2021 in [place] Office Visit from 08/13/2020 in [facility] Office Visit from 11/26/2019 in [facility] Office Visit from 11/19/2019 in [facility] Office Visit from 11/11/2019 in [facility]  PHQ-2 Total Score 0 0 0 0 0  PHQ-9 Total Score -- -- 0 0 0    Flowsheet Row Video Visit from 03/24/2021 in [facility] No Risk       Assessment and Plan: JILLAINE WAREN is a 55 year old Caucasian female, employed, divorced, lives in [place], has a history of bipolar disorder was evaluated by telemedicine today.  Patient is currently stable.  Plan as noted below.  Plan Bipolar disorder in remission Depakote ER 1000 mg at bedtime Latuda 60 mg p.o. daily with meals Depakote level-dated 12/17/2020-17-subtherapeutic-patient reports she has not skipped her dosage however likely went to the lab later on in the afternoon instead of in the morning.  Unknown if that could have contributed to the level being subtherapeutic.  Encouraged compliance. CMP-BUN-1.15-slightly elevated, GFR-low at 53, otherwise within normal limits CBC with differential-within normal limits  Insomnia-stable Trazodone 150 mg p.o. nightly   Follow-up in clinic in 3 months in office.  I have spent at least 15 minutes non face to face  with patient today .  This note was generated in part or whole with voice recognition software. Voice recognition is usually quite accurate but there are transcription errors that can and very often do occur. I apologize for any typographical errors that were not detected and corrected.       Ursula Alert, MD 03/25/2021, 8:17 AM

## 2021-06-24 ENCOUNTER — Telehealth: Payer: Self-pay

## 2021-06-24 NOTE — Telephone Encounter (Signed)
Copied from [place] [id number]. Topic: Appointment Scheduling - Scheduling Inquiry for Clinic >> Jun 24, 2021 10:14 AM Greggory Keen D wrote: Pt has an appeal form for work for her BMI that needs to be completed.  CB#  [phone]

## 2021-06-27 NOTE — Telephone Encounter (Signed)
Lvm for pt to call and schedule an appt  

## 2021-07-03 ENCOUNTER — Other Ambulatory Visit: Payer: Self-pay | Admitting: Psychiatry

## 2021-07-03 DIAGNOSIS — F5105 Insomnia due to other mental disorder: Secondary | ICD-10-CM

## 2021-07-03 DIAGNOSIS — F99 Mental disorder, not otherwise specified: Secondary | ICD-10-CM

## 2021-07-08 ENCOUNTER — Ambulatory Visit: Payer: 59 | Admitting: Psychiatry

## 2021-07-19 ENCOUNTER — Telehealth: Payer: Self-pay

## 2021-07-19 NOTE — Telephone Encounter (Signed)
Lvm to inform pt that Kristeen Miss has maxed out her CPE's for the day but that I would forward the message   Copied from [place] [id number]. Topic: Appointment Scheduling - Scheduling Inquiry for Clinic >> Jul 19, 2021  9:37 AM Tessa Lerner A wrote: Reason for CRM: Patient would like to know if is is possible to be seen for Physical during their visit on 07/22/21 at 3:40 PM   Please contact to further advise scheduling when possible   Appt was not changed at the time of call with agent

## 2021-07-21 NOTE — Telephone Encounter (Signed)
Pt is scheduled to see Kathrine Haddock on 07/28/2021.

## 2021-07-22 ENCOUNTER — Ambulatory Visit: Payer: 59 | Admitting: Psychiatry

## 2021-07-22 ENCOUNTER — Ambulatory Visit: Payer: Managed Care, Other (non HMO) | Admitting: Family Medicine

## 2021-07-28 ENCOUNTER — Encounter: Payer: Self-pay | Admitting: Unknown Physician Specialty

## 2021-07-28 ENCOUNTER — Other Ambulatory Visit: Payer: Self-pay

## 2021-07-28 ENCOUNTER — Ambulatory Visit (INDEPENDENT_AMBULATORY_CARE_PROVIDER_SITE_OTHER): Payer: Managed Care, Other (non HMO) | Admitting: Unknown Physician Specialty

## 2021-07-28 VITALS — BP 124/78 | HR 81 | Temp 98.3°F | Resp 16 | Ht 69.0 in | Wt 201.2 lb

## 2021-07-28 DIAGNOSIS — Z23 Encounter for immunization: Secondary | ICD-10-CM

## 2021-07-28 DIAGNOSIS — Z0000 Encounter for general adult medical examination without abnormal findings: Secondary | ICD-10-CM

## 2021-07-28 NOTE — Progress Notes (Signed)
BP 124/78   Pulse 81   Temp 98.3 F (36.8 C) (Oral)   Resp 16   Ht '5\' 9"'$  (1.753 m)   Wt 201 lb 3.2 oz (91.3 kg)   SpO2 94%   BMI 29.71 kg/m    Subjective:    Patient ID: Barbara [name], female    DOB: February 12, 1966, 55 y.o.   MRN: [id number]  HPI: Barbara [name] is a 55 y.o. female  Chief Complaint  Patient presents with   Annual Exam   Pt states she had labs done through work.  Works at  [employer].  I don't have those results and OK if we repeat them.    Sees psychiatry.  Goes to [facility] for pap and mammogram screening.    Depression screen [facility] 2/9 07/28/2021 08/13/2020 11/26/2019 11/19/2019 11/11/2019  Decreased Interest 1 0 0 0 0  Down, Depressed, Hopeless 1 0 0 0 0  PHQ - 2 Score 2 0 0 0 0  Altered sleeping 1 - 0 0 0  Tired, decreased energy 0 - 0 0 0  Change in appetite 0 - 0 0 0  Feeling bad or failure about yourself  0 - 0 0 0  Trouble concentrating 0 - 0 0 0  Moving slowly or fidgety/restless 0 - 0 0 0  Suicidal thoughts 0 - 0 0 0  PHQ-9 Score 3 - 0 0 0  Difficult doing work/chores Somewhat difficult - Not difficult at all Not difficult at all Not difficult at all  Some encounter information is confidential and restricted. Go to Review Flowsheets activity to see all data.   Social History   Socioeconomic History   Marital status: Divorced    Spouse name: Not on file   Number of children: 1   Years of education: Not on file   Highest education level: Associate degree: occupational, Hotel manager, or vocational program  Occupational History    Comment: full time  Tobacco Use   Smoking status: Former    Types: Cigarettes    Quit date: 02/12/1990    Years since quitting: 31.4   Smokeless tobacco: Never  Vaping Use   Vaping Use: Never used  Substance and Sexual Activity   Alcohol use: No    Alcohol/week: 0.0 standard drinks   Drug use: No   Sexual activity: Not Currently  Other Topics Concern   Not on file  Social History Narrative   Not on file   Social  Determinants of Health   Financial Resource Strain: Low Risk    Difficulty of Paying Living Expenses: Not hard at all  Food Insecurity: No Food Insecurity   Worried About Charity fundraiser in the Last Year: Never true   [place] in the Last Year: Never true  Transportation Needs: No Transportation Needs   Lack of Transportation (Medical): No   Lack of Transportation (Non-Medical): No  Physical Activity: Insufficiently Active   Days of Exercise per Week: 1 day   Minutes of Exercise per Session: 20 min  Stress: Stress Concern Present   Feeling of Stress : To some extent  Social Connections: Moderately Isolated   Frequency of Communication with Friends and Family: More than three times a week   Frequency of Social Gatherings with Friends and Family: More than three times a week   Attends Religious Services: 1 to 4 times per year   Active Member of [employer] or Organizations: No   Attends Archivist Meetings: Never  Marital Status: Divorced  Human resources officer Violence: Not At Risk   Fear of Current or Ex-Partner: No   Emotionally Abused: No   Physically Abused: No   Sexually Abused: No   Family History  Problem Relation Age of Onset   Heart attack Father    Cancer Father        melanoma   Cancer Maternal Aunt        breast   Breast cancer Maternal Aunt    Cancer Paternal Aunt        breast   Breast cancer Paternal Aunt    Breast cancer Cousin    Past Medical History:  Diagnosis Date   Acid reflux    Adnexal mass    Bipolar disorder ([facility])    Depression    Depression    Herpes    Irregular menstrual bleeding    Melanoma ([place])    Past Surgical History:  Procedure Laterality Date   CERVICAL BIOPSY     Dr. Kenton Kingfisher with [facility]   COLONOSCOPY     COLONOSCOPY     DILATION AND CURETTAGE OF UTERUS     MELANOMA EXCISION     TONSILLECTOMY       Relevant past medical, surgical, family and social history reviewed and updated as indicated. Interim  medical history since our last visit reviewed. Allergies and medications reviewed and updated.  Review of Systems  Constitutional: Negative.   HENT: Negative.    Cardiovascular: Negative.   Endocrine: Negative.   Genitourinary: Negative.   Neurological: Negative.   Hematological: Negative.    Per HPI unless specifically indicated above     Objective:    BP 124/78   Pulse 81   Temp 98.3 F (36.8 C) (Oral)   Resp 16   Ht '5\' 9"'$  (1.753 m)   Wt 201 lb 3.2 oz (91.3 kg)   SpO2 94%   BMI 29.71 kg/m   Wt Readings from Last 3 Encounters:  07/28/21 201 lb 3.2 oz (91.3 kg)  10/27/20 200 lb (90.7 kg)  08/13/20 200 lb (90.7 kg)    Physical Exam Constitutional:      General: She is not in acute distress.    Appearance: Normal appearance. She is well-developed.  HENT:     Head: Normocephalic and atraumatic.  Eyes:     General: Lids are normal. No scleral icterus.       Right eye: No discharge.        Left eye: No discharge.     Conjunctiva/sclera: Conjunctivae normal.  Neck:     Vascular: No carotid bruit or JVD.  Cardiovascular:     Rate and Rhythm: Normal rate and regular rhythm.     Heart sounds: Normal heart sounds.  Pulmonary:     Effort: Pulmonary effort is normal. No respiratory distress.     Breath sounds: Normal breath sounds.  Abdominal:     Palpations: There is no hepatomegaly or splenomegaly.  Musculoskeletal:        General: Normal range of motion.     Cervical back: Normal range of motion and neck supple.  Skin:    General: Skin is warm and dry.     Coloration: Skin is not pale.     Findings: No rash.     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  Psychiatric:        Behavior:  Behavior normal.        Thought Content: Thought content normal.        Judgment: Judgment normal.    Results for orders placed or performed in visit on 11/19/19  Novel  Coronavirus, NAA ([facility])   Specimen: Nasopharyngeal(NP) swabs in vial transport medium   NASOPHARYNGE  TESTING  Result Value Ref Range   SARS-CoV-2, NAA Not Detected Not Detected      Assessment & Plan:   Problem List Items Addressed This Visit   None Visit Diagnoses     Need for influenza vaccination    -  Primary   Relevant Orders   Flu Vaccine QUAD 6+ mos PF IM (Fluarix Quad PF) (Completed)   Routine general medical examination at a health care facility       Relevant Orders   CBC with Differential/Platelet   Lipid panel   TSH   Comprehensive metabolic panel       Health maintenance Update flu vaccine States got shingrx at [facility] Pap and breast per Dr. Kenton Kingfisher at [facility] due in 2 years  Follow up plan: Return in about 1 year (around 07/28/2022).

## 2021-07-30 LAB — CBC WITH DIFFERENTIAL/PLATELET
Basophils Absolute: 0 10*3/uL (ref 0.0–0.2)
Basos: 0 %
EOS (ABSOLUTE): 0.1 10*3/uL (ref 0.0–0.4)
Eos: 2 %
Hematocrit: 42.9 % (ref 34.0–46.6)
Hemoglobin: 14.1 g/dL (ref 11.1–15.9)
Immature Grans (Abs): 0 10*3/uL (ref 0.0–0.1)
Immature Granulocytes: 1 %
Lymphocytes Absolute: 1.8 10*3/uL (ref 0.7–3.1)
Lymphs: 24 %
MCH: 29 pg (ref 26.6–33.0)
MCHC: 32.9 g/dL (ref 31.5–35.7)
MCV: 88 fL (ref 79–97)
Monocytes Absolute: 0.6 10*3/uL (ref 0.1–0.9)
Monocytes: 8 %
Neutrophils Absolute: 5 10*3/uL (ref 1.4–7.0)
Neutrophils: 65 %
Platelets: 181 10*3/uL (ref 150–450)
RBC: 4.86 x10E6/uL (ref 3.77–5.28)
RDW: 13.2 % (ref 11.7–15.4)
WBC: 7.5 10*3/uL (ref 3.4–10.8)

## 2021-07-30 LAB — LIPID PANEL
Chol/HDL Ratio: 4.5 ratio — ABNORMAL HIGH (ref 0.0–4.4)
Cholesterol, Total: 226 mg/dL — ABNORMAL HIGH (ref 100–199)
HDL: 50 mg/dL (ref >39)
LDL Chol Calc (NIH): 143 mg/dL — ABNORMAL HIGH (ref 0–99)
Triglycerides: 181 mg/dL — ABNORMAL HIGH (ref 0–149)
VLDL Cholesterol Cal: 33 mg/dL (ref 5–40)

## 2021-07-30 LAB — TSH: TSH: 1.7 u[IU]/mL (ref 0.450–4.500)

## 2021-08-12 ENCOUNTER — Ambulatory Visit: Payer: 59 | Admitting: Psychiatry

## 2021-08-19 ENCOUNTER — Other Ambulatory Visit: Payer: Self-pay

## 2021-08-19 ENCOUNTER — Encounter: Payer: Self-pay | Admitting: Psychiatry

## 2021-08-19 ENCOUNTER — Ambulatory Visit (INDEPENDENT_AMBULATORY_CARE_PROVIDER_SITE_OTHER): Payer: 59 | Admitting: Psychiatry

## 2021-08-19 VITALS — BP 122/71 | HR 67 | Temp 97.6°F | Wt 200.4 lb

## 2021-08-19 DIAGNOSIS — F3176 Bipolar disorder, in full remission, most recent episode depressed: Secondary | ICD-10-CM

## 2021-08-19 DIAGNOSIS — Z634 Disappearance and death of family member: Secondary | ICD-10-CM | POA: Insufficient documentation

## 2021-08-19 DIAGNOSIS — G4701 Insomnia due to medical condition: Secondary | ICD-10-CM | POA: Diagnosis not present

## 2021-08-19 DIAGNOSIS — Z79899 Other long term (current) drug therapy: Secondary | ICD-10-CM | POA: Diagnosis not present

## 2021-08-19 MED ORDER — HYDROXYZINE HCL 25 MG PO TABS: 12.5000 mg | ORAL_TABLET | Freq: Three times a day (TID) | ORAL | 1 refills | Status: DC | PRN

## 2021-08-19 MED ORDER — TRAZODONE HCL 100 MG PO TABS: 100.0000 mg | ORAL_TABLET | Freq: Every evening | ORAL | 0 refills | Status: DC | PRN

## 2021-08-19 NOTE — Progress Notes (Signed)
[place] MD OP Progress Note  08/19/2021 11:34 AM LIVELY HABERMAN  MRN:  [id number]  Chief Complaint:  Chief Complaint   Follow-up; Anxiety    HPI: Barbara [name] is a 55 year old Caucasian female, lives in [place], employed, divorced, has a history of bipolar disorder was evaluated in office today.  Patient reports she is grieving the loss of her dad who passed away in 07-Jul-2021.  Her dad was sick and was with hospice.  Patient reports she is currently trying to help her mother and also has good social support from her family.  Patient reports sleep is restless since the past few months especially with the grief.  She is taking trazodone 100 mg at bedtime.  Denies side effects.  Patient denies any significant manic or hypomanic symptoms or depression symptoms at this time.  She denies any suicidality, homicidality or perceptual disturbances.  Reviewed and discussed most recent labs including lipid panel-abnormal.  Patient is on Latuda however since she is currently going through situational stressors will not reduce the dosage however could monitor and reevaluate in future sessions.  Patient reports she has been trying to exercise as well as watch her diet.  Patient denies any other concerns today.  Visit Diagnosis:    ICD-10-CM   1. Bipolar disorder, in full remission, most recent episode depressed ([place])  F31.76     2. Insomnia due to medical condition  G47.01 traZODone (DESYREL) 100 MG tablet    hydrOXYzine (ATARAX/VISTARIL) 25 MG tablet   anxiety, grief    3. Bereavement  Z63.4     4. High risk medication use  Z79.899       Past Psychiatric History: Reviewed past psychiatric history from progress note from 02/12/2018  Past Medical History:  Past Medical History:  Diagnosis Date   Acid reflux    Adnexal mass    Bipolar disorder (HCC)    Depression    Depression    Herpes    Irregular menstrual bleeding    Melanoma ([place])     Past Surgical History:  Procedure  Laterality Date   CERVICAL BIOPSY     Dr. Kenton Kingfisher with [facility]   COLONOSCOPY     COLONOSCOPY     DILATION AND CURETTAGE OF UTERUS     MELANOMA EXCISION     TONSILLECTOMY      Family Psychiatric History: Reviewed family psychiatric history from progress note on 02/12/2018  Family History:  Family History  Problem Relation Age of Onset   Heart attack Father    Cancer Father        melanoma   Cancer Maternal Aunt        breast   Breast cancer Maternal Aunt    Cancer Paternal Aunt        breast   Breast cancer Paternal Aunt    Breast cancer Cousin     Social History: Reviewed social history from progress note on 02/12/2018 Social History   Socioeconomic History   Marital status: Divorced    Spouse name: Not on file   Number of children: 1   Years of education: Not on file   Highest education level: Associate degree: occupational, Hotel manager, or vocational program  Occupational History    Comment: full time  Tobacco Use   Smoking status: Former    Types: Cigarettes    Quit date: 02/12/1990    Years since quitting: 31.5   Smokeless tobacco: Never  Vaping Use   Vaping Use: Never used  Substance and Sexual Activity   Alcohol use: No    Alcohol/week: 0.0 standard drinks   Drug use: No   Sexual activity: Not Currently  Other Topics Concern   Not on file  Social History Narrative   Not on file   Social Determinants of Health   Financial Resource Strain: Low Risk    Difficulty of Paying Living Expenses: Not hard at all  Food Insecurity: No Food Insecurity   Worried About Charity fundraiser in the Last Year: Never true   [place] in the Last Year: Never true  Transportation Needs: No Transportation Needs   Lack of Transportation (Medical): No   Lack of Transportation (Non-Medical): No  Physical Activity: Insufficiently Active   Days of Exercise per Week: 1 day   Minutes of Exercise per Session: 20 min  Stress: Stress Concern Present   Feeling of Stress  : To some extent  Social Connections: Moderately Isolated   Frequency of Communication with Friends and Family: More than three times a week   Frequency of Social Gatherings with Friends and Family: More than three times a week   Attends Religious Services: 1 to 4 times per year   Active Member of [employer] or Organizations: No   Attends Archivist Meetings: Never   Marital Status: Divorced    Allergies:  Allergies  Allergen Reactions   Penicillins Hives and Other (See Comments)    delusional   Sulfa Antibiotics Hives and Other (See Comments)    dellusional   Prednisone Other (See Comments)    Heart racing    Metabolic Disorder Labs: Lab Results  Component Value Date   HGBA1C 5.3 12/17/2020   Lab Results  Component Value Date   PROLACTIN 10.6 12/17/2020   PROLACTIN 8.2 08/25/2019   Lab Results  Component Value Date   CHOL 226 (H) 07/29/2021   TRIG 181 (H) 07/29/2021   HDL 50 07/29/2021   CHOLHDL 4.5 (H) 07/29/2021   LDLCALC 143 (H) 07/29/2021   LDLCALC 141 (H) 08/25/2019   Lab Results  Component Value Date   TSH 1.700 07/29/2021   TSH 3.530 12/17/2020    Therapeutic Level Labs: No results found for: LITHIUM Lab Results  Component Value Date   VALPROATE 17 (L) 12/17/2020   VALPROATE 84 08/25/2019   No components found for:  CBMZ  Current Medications: Current Outpatient Medications  Medication Sig Dispense Refill   acetaminophen (TYLENOL) 650 MG CR tablet Take by mouth.     Ascorbic Acid (VITAMIN C) 1000 MG tablet Take 2,000 mg by mouth daily.     Black Elderberry 50 MG/5ML SYRP Take by mouth.     Cholecalciferol 25 MCG (1000 UT) tablet Take by mouth.     diazepam (VALIUM) 5 MG tablet Take 1 tablet (5 mg total) by mouth every 8 (eight) hours as needed for muscle spasms. 20 tablet 0   divalproex (DEPAKOTE ER) 500 MG 24 hr tablet TAKE 2 TABLETS BY MOUTH  DAILY 180 tablet 3   doxycycline (VIBRAMYCIN) 100 MG capsule Take 1 capsule (100 mg total) by  mouth 2 (two) times daily. 20 capsule 0   hydrOXYzine (ATARAX/VISTARIL) 25 MG tablet Take 0.5-1 tablets (12.5-25 mg total) by mouth 3 (three) times daily as needed. Anxiety, sleep 270 tablet 1   LATUDA 60 MG TABS TAKE 1 TABLET BY MOUTH  DAILY WITH SUPPER 90 tablet 3   neomycin-polymyxin b-dexamethasone (MAXITROL) 3.5-10000-0.1 SUSP   0   pimecrolimus (ELIDEL)  1 % cream Apply topically.     tolterodine (DETROL LA) 2 MG 24 hr capsule Take 1 capsule (2 mg total) by mouth daily. 30 capsule 11   triamcinolone cream (KENALOG) 0.1 % APPLY TO AFFECTED AREA TWICE A DAY     traZODone (DESYREL) 100 MG tablet Take 1-1.5 tablets (100-150 mg total) by mouth at bedtime as needed for sleep. 135 tablet 0   No current facility-administered medications for this visit.     Musculoskeletal: Strength & Muscle Tone: within normal limits Gait & Station: normal Patient leans: N/A  Psychiatric Specialty Exam: Review of Systems  Psychiatric/Behavioral:  Positive for sleep disturbance.        Grieving  All other systems reviewed and are negative.  Blood pressure 122/71, pulse 67, temperature 97.6 F (36.4 C), temperature source Temporal, weight 200 lb 6.4 oz (90.9 kg).Body mass index is 29.59 kg/m.  General Appearance: Casual  Eye Contact:  Fair  Speech:  Clear and Coherent  Volume:  Normal  Mood:   Grieving  Affect:  Congruent  Thought Process:  Goal Directed and Descriptions of Associations: Intact  Orientation:  Full (Time, Place, and Person)  Thought Content: Logical   Suicidal Thoughts:  No  Homicidal Thoughts:  No  Memory:  Immediate;   Fair Recent;   Fair Remote;   Fair  Judgement:  Fair  Insight:  Fair  Psychomotor Activity:  Normal  Concentration:  Concentration: Fair and Attention Span: Fair  Recall:  [employer] of Knowledge: Fair  Language: Fair  Akathisia:  No  Handed:  Right  AIMS (if indicated): done  Assets:  Communication Skills Desire for Improvement Housing Social Support   ADL's:  Intact  Cognition: WNL  Sleep:   Restless   Screenings: [place] Office Visit from 08/19/2021 in [place] Office Visit from 02/12/2018 in [place] Total Score 0 0      GAD-7    [place] Visit from 08/19/2021 in [place] Office Visit from 07/28/2021 in [facility] Video Visit from 03/24/2021 in [place]  Total GAD-7 Score 3 0 2      PHQ2-9    [place] Office Visit from 08/19/2021 in [place] Office Visit from 07/28/2021 in [facility] Video Visit from 03/24/2021 in [place] Visit from 08/13/2020 in [facility] Visit from 11/26/2019 in [facility]  PHQ-2 Total Score 0 2 0 0 0  PHQ-9 Total Score -- 3 -- -- [place] Office Visit from 08/19/2021 in [place] Video Visit from 03/24/2021 in [place] No Risk No Risk        Assessment and Plan: YUE FLANIGAN is a 55 year old Caucasian female, employed, divorced, lives in [place], has a history of bipolar disorder was evaluated in office today.  Patient is currently struggling with grief, as well as has sleep problems.  She will benefit from the following plan.  Plan Bipolar disorder in remission Depakote ER 1000 mg at bedtime Latuda 60 mg p.o. daily with meals Depakote level-12/17/2020- 17, subtherapeutic.  However will not make changes with her medications since bipolar symptoms are stable. AIMS - 0   Insomnia-unstable Increase trazodone to 100 - 150 mg p.o. nightly as needed Discussed sleep hygiene techniques  Bereavement-unstable Discussed referral for CBT, she is not interested Start hydroxyzine 12.5-25 mg p.o.  3 times daily as needed for anxiety and sleep  High risk medication use-unstable Reviewed and discussed most recent labs-lipid panel-abnormal-dated 07/29/2021-patient to follow up with primary provider.  Discussed diet management, exercise, calorie counting. Also discussed reducing the dose of Latuda in future sessions as needed.  Follow-up in clinic in 2 months in person.  This note was generated in part or whole with voice recognition software. Voice recognition is usually quite accurate but there are transcription errors that can and very often do occur. I apologize for any typographical errors that were not detected and corrected.     Ursula Alert, MD 08/19/2021, 11:34 AM

## 2021-08-19 NOTE — Patient Instructions (Signed)
Calorie Counting for Weight Loss Calories are units of energy. Your body needs a certain number of calories from food to keep going throughout the day. When you eat or drink more calories than your body needs, your body stores the extra calories mostly as fat. When you eat or drink fewer calories than your body needs, your body burns fat to get the energy it needs. Calorie counting means keeping track of how many calories you eat and drink each day. Calorie counting can be helpful if you need to lose weight. If you eat fewer calories than your body needs, you should lose weight. Ask your health care provider what a healthy weight is for you. For calorie counting to work, you will need to eat the right number of calories each day to lose a healthy amount of weight per week. A dietitian can help you figure out how many calories you need in a day and will suggest ways to reach your calorie goal. A healthy amount of weight to lose each week is usually 1-2 lb (0.5-0.9 kg). This usually means that your daily calorie intake should be reduced by 500-750 calories. Eating 1,200-1,500 calories a day can help most women lose weight. Eating 1,500-1,800 calories a day can help most men lose weight. What do I need to know about calorie counting? Work with your health care provider or dietitian to determine how many calories you should get each day. To meet your daily calorie goal, you will need to: Find out how many calories are in each food that you would like to eat. Try to do this before you eat. Decide how much of the food you plan to eat. Keep a food log. Do this by writing down what you ate and how many calories it had. To successfully lose weight, it is important to balance calorie counting with a healthy lifestyle that includes regular activity. Where do I find calorie information? The number of calories in a food can be found on a Nutrition Facts label. If a food does not have a Nutrition Facts label, try to  look up the calories online or ask your dietitian for help. Remember that calories are listed per serving. If you choose to have more than one serving of a food, you will have to multiply the calories per serving by the number of servings you plan to eat. For example, the label on a package of bread might say that a serving size is 1 slice and that there are 90 calories in a serving. If you eat 1 slice, you will have eaten 90 calories. If you eat 2 slices, you will have eaten 180 calories. How do I keep a food log? After each time that you eat, record the following in your food log as soon as possible: What you ate. Be sure to include toppings, sauces, and other extras on the food. How much you ate. This can be measured in cups, ounces, or number of items. How many calories were in each food and drink. The total number of calories in the food you ate. Keep your food log near you, such as in a pocket-sized notebook or on an app or website on your mobile phone. Some programs will calculate calories for you and show you how many calories you have left to meet your daily goal. What are some portion-control tips? Know how many calories are in a serving. This will help you know how many servings you can have of a certain food.  Use a measuring cup to measure serving sizes. You could also try weighing out portions on a kitchen scale. With time, you will be able to estimate serving sizes for some foods. Take time to put servings of different foods on your favorite plates or in your favorite bowls and cups so you know what a serving looks like. Try not to eat straight from a food's packaging, such as from a bag or box. Eating straight from the package makes it hard to see how much you are eating and can lead to overeating. Put the amount you would like to eat in a cup or on a plate to make sure you are eating the right portion. Use smaller plates, glasses, and bowls for smaller portions and to prevent  overeating. Try not to multitask. For example, avoid watching TV or using your computer while eating. If it is time to eat, sit down at a table and enjoy your food. This will help you recognize when you are full. It will also help you be more mindful of what and how much you are eating. What are tips for following this plan? Reading food labels Check the calorie count compared with the serving size. The serving size may be smaller than what you are used to eating. Check the source of the calories. Try to choose foods that are high in protein, fiber, and vitamins, and low in saturated fat, trans fat, and sodium. Shopping Read nutrition labels while you shop. This will help you make healthy decisions about which foods to buy. Pay attention to nutrition labels for low-fat or fat-free foods. These foods sometimes have the same number of calories or more calories than the full-fat versions. They also often have added sugar, starch, or salt to make up for flavor that was removed with the fat. Make a grocery list of lower-calorie foods and stick to it. Cooking Try to cook your favorite foods in a healthier way. For example, try baking instead of frying. Use low-fat dairy products. Meal planning Use more fruits and vegetables. One-half of your plate should be fruits and vegetables. Include lean proteins, such as chicken, [place], and fish. Lifestyle Each week, aim to do one of the following: 150 minutes of moderate exercise, such as walking. 75 minutes of vigorous exercise, such as running. General information Know how many calories are in the foods you eat most often. This will help you calculate calorie counts faster. Find a way of tracking calories that works for you. Get creative. Try different apps or programs if writing down calories does not work for you. What foods should I eat?  Eat nutritious foods. It is better to have a nutritious, high-calorie food, such as an avocado, than a food with  few nutrients, such as a bag of potato chips. Use your calories on foods and drinks that will fill you up and will not leave you hungry soon after eating. Examples of foods that fill you up are nuts and nut butters, vegetables, lean proteins, and high-fiber foods such as whole grains. High-fiber foods are foods with more than 5 g of fiber per serving. Pay attention to calories in drinks. Low-calorie drinks include water and unsweetened drinks. The items listed above may not be a complete list of foods and beverages you can eat. Contact a dietitian for more information. What foods should I limit? Limit foods or drinks that are not good sources of vitamins, minerals, or protein or that are high in unhealthy fats. These include:  Candy. Other sweets. Sodas, specialty coffee drinks, alcohol, and juice. The items listed above may not be a complete list of foods and beverages you should avoid. Contact a dietitian for more information. How do I count calories when eating out? Pay attention to portions. Often, portions are much larger when eating out. Try these tips to keep portions smaller: Consider sharing a meal instead of getting your own. If you get your own meal, eat only half of it. Before you start eating, ask for a container and put half of your meal into it. When available, consider ordering smaller portions from the menu instead of full portions. Pay attention to your food and drink choices. Knowing the way food is cooked and what is included with the meal can help you eat fewer calories. If calories are listed on the menu, choose the lower-calorie options. Choose dishes that include vegetables, fruits, whole grains, low-fat dairy products, and lean proteins. Choose items that are boiled, broiled, grilled, or steamed. Avoid items that are buttered, battered, fried, or served with cream sauce. Items labeled as crispy are usually fried, unless stated otherwise. Choose water, low-fat milk,  unsweetened iced tea, or other drinks without added sugar. If you want an alcoholic beverage, choose a lower-calorie option, such as a glass of wine or light beer. Ask for dressings, sauces, and syrups on the side. These are usually high in calories, so you should limit the amount you eat. If you want a salad, choose a garden salad and ask for grilled meats. Avoid extra toppings such as bacon, cheese, or fried items. Ask for the dressing on the side, or ask for olive oil and vinegar or lemon to use as dressing. Estimate how many servings of a food you are given. Knowing serving sizes will help you be aware of how much food you are eating at restaurants. Where to find more information Centers for Disease Control and Prevention: http://www.wolf.info/ [place] Department of Agriculture: http://www.wilson-mendoza.org/ Summary Calorie counting means keeping track of how many calories you eat and drink each day. If you eat fewer calories than your body needs, you should lose weight. A healthy amount of weight to lose per week is usually 1-2 lb (0.5-0.9 kg). This usually means reducing your daily calorie intake by 500-750 calories. The number of calories in a food can be found on a Nutrition Facts label. If a food does not have a Nutrition Facts label, try to look up the calories online or ask your dietitian for help. Use smaller plates, glasses, and bowls for smaller portions and to prevent overeating. Use your calories on foods and drinks that will fill you up and not leave you hungry shortly after a meal. This information is not intended to replace advice given to you by your health care provider. Make sure you discuss any questions you have with your health care provider. Document Revised: 12/11/2019 Document Reviewed: 12/11/2019 Elsevier Patient Education  [place]. Hydroxyzine Capsules or Tablets What is this medication? HYDROXYZINE (hye Ackworth i zeen) treats the symptoms of allergies and allergic reactions. It may also be  used to treat anxiety or cause drowsiness before a procedure. It works by blocking histamine, a substance released by the body during an allergic reaction. It belongs to a group of medications called antihistamines. This medicine may be used for other purposes; ask your health care provider or pharmacist if you have questions. COMMON BRAND NAME(S): ANX, Atarax, Rezine, Vistaril What should I tell my care team before I  take this medication? They need to know if you have any of these conditions: Glaucoma Heart disease History of irregular heartbeat Kidney disease Liver disease Lung or breathing disease, like asthma Stomach or intestine problems Thyroid disease Trouble passing urine An unusual or allergic reaction to hydroxyzine, cetirizine, other medications, foods, dyes or preservatives Pregnant or trying to get pregnant Breast-feeding How should I use this medication? Take this medication by mouth with a full glass of water. Follow the directions on the prescription label. You may take this medication with food or on an empty stomach. Take your medication at regular intervals. Do not take your medication more often than directed. Talk to your care team regarding the use of this medication in children. Special care may be needed. While this medication may be prescribed for children as young as 67 years of age for selected conditions, precautions do apply. Patients over 65 years old may have a stronger reaction and need a smaller dose. Overdosage: If you think you have taken too much of this medicine contact a poison control center or emergency room at once. NOTE: This medicine is only for you. Do not share this medicine with others. What if I miss a dose? If you miss a dose, take it as soon as you can. If it is almost time for your next dose, take only that dose. Do not take double or extra doses. What may interact with this medication? Do not take this medication with any of the  following: Cisapride Dronedarone Pimozide Thioridazine This medication may also interact with the following: Alcohol Antihistamines for allergy, cough, and cold Atropine Barbiturate medications for sleep or seizures, like phenobarbital Certain antibiotics like erythromycin or clarithromycin Certain medications for anxiety or sleep Certain medications for bladder problems like oxybutynin, tolterodine Certain medications for depression or psychotic disturbances Certain medications for irregular heart beat Certain medications for Parkinson's disease like benztropine, trihexyphenidyl Certain medications for seizures like phenobarbital, primidone Certain medications for stomach problems like dicyclomine, hyoscyamine Certain medications for travel sickness like scopolamine Ipratropium Narcotic medications for pain Other medications that prolong the QT interval (which can cause an abnormal heart rhythm) like dofetilide This list may not describe all possible interactions. Give your health care provider a list of all the medicines, herbs, non-prescription drugs, or dietary supplements you use. Also tell them if you smoke, drink alcohol, or use illegal drugs. Some items may interact with your medicine. What should I watch for while using this medication? Tell your care team if your symptoms do not improve. You may get drowsy or dizzy. Do not drive, use machinery, or do anything that needs mental alertness until you know how this medication affects you. Do not stand or sit up quickly, especially if you are an older patient. This reduces the risk of dizzy or fainting spells. Alcohol may interfere with the effect of this medication. Avoid alcoholic drinks. Your mouth may get dry. Chewing sugarless gum or sucking hard candy, and drinking plenty of water may help. Contact your care team if the problem does not go away or is severe. This medication may cause dry eyes and blurred vision. If you wear  contact lenses you may feel some discomfort. Lubricating drops may help. See your eye care specialist if the problem does not go away or is severe. If you are receiving skin tests for allergies, tell your care team you are using this medication. What side effects may I notice from receiving this medication? Side effects that you should report  to your care team as soon as possible: Allergic reactions-skin rash, itching, hives, swelling of the face, lips, tongue, or throat Heart rhythm changes-fast or irregular heartbeat, dizziness, feeling faint or lightheaded, chest pain, trouble breathing Side effects that usually do not require medical attention (report to your care team if they continue or are bothersome): Confusion Drowsiness Dry mouth Hallucinations Headache This list may not describe all possible side effects. Call your doctor for medical advice about side effects. You may report side effects to FDA at 1-800-FDA-1088. Where should I keep my medication? Keep out of the reach of children and pets. Store at room temperature between 15 and 30 degrees C (59 and 86 degrees F). Keep container tightly closed. Throw away any unused medication after the expiration date. NOTE: This sheet is a summary. It may not cover all possible information. If you have questions about this medicine, talk to your doctor, pharmacist, or health care provider.  2022 Elsevier/Gold Standard (2021-01-11 15:19:25)

## 2021-10-20 ENCOUNTER — Other Ambulatory Visit: Payer: Self-pay | Admitting: Obstetrics & Gynecology

## 2021-10-28 ENCOUNTER — Ambulatory Visit (INDEPENDENT_AMBULATORY_CARE_PROVIDER_SITE_OTHER): Payer: 59 | Admitting: Psychiatry

## 2021-10-28 ENCOUNTER — Encounter: Payer: Self-pay | Admitting: Psychiatry

## 2021-10-28 ENCOUNTER — Other Ambulatory Visit: Payer: Self-pay

## 2021-10-28 VITALS — BP 128/82 | HR 68 | Temp 98.3°F | Wt 202.2 lb

## 2021-10-28 DIAGNOSIS — F3176 Bipolar disorder, in full remission, most recent episode depressed: Secondary | ICD-10-CM

## 2021-10-28 DIAGNOSIS — Z79899 Other long term (current) drug therapy: Secondary | ICD-10-CM

## 2021-10-28 DIAGNOSIS — G4701 Insomnia due to medical condition: Secondary | ICD-10-CM

## 2021-10-28 DIAGNOSIS — Z634 Disappearance and death of family member: Secondary | ICD-10-CM | POA: Diagnosis not present

## 2021-10-28 MED ORDER — TRAZODONE HCL 100 MG PO TABS: 100.0000 mg | ORAL_TABLET | Freq: Every evening | ORAL | 3 refills | Status: DC | PRN

## 2021-10-28 NOTE — Progress Notes (Signed)
[place] MD OP Progress Note  10/28/2021 12:31 PM Barbara [name]  MRN:  [id number]  Chief Complaint:  Chief Complaint   Follow-up; Anxiety; Depression    HPI: Barbara [name] is a 55 year old Caucasian female, lives in [place], employed, divorced, has a history of bipolar disorder was evaluated in office today.  Patient today reports she continues to grieve the loss of her dad who passed away in July 16, 2021.  Patient reports since it is the holiday season it has been harder.  Patient became tearful when she discussed this.  Patient however reports she wants to stay on the current medication regimen.  She has been compliant on her medications.  Denies side effects.  Patient reports work is going well.  She has been functioning okay at work.  She does have good social support system.  Patient denies any suicidality, homicidality or perceptual disturbances.  Patient denies any other concerns today.  Visit Diagnosis:    ICD-10-CM   1. Bipolar disorder, in full remission, most recent episode depressed ([place])  F31.76     2. Insomnia due to medical condition  G47.01 traZODone (DESYREL) 100 MG tablet   anxiety, grief    3. Bereavement  Z63.4     4. High risk medication use  Z79.899       Past Psychiatric History: Reviewed past psychiatric history from progress note on 02/12/2018.  Past Medical History:  Past Medical History:  Diagnosis Date   Acid reflux    Adnexal mass    Bipolar disorder ([place])    Depression    Depression    Herpes    Irregular menstrual bleeding    Melanoma ([place])     Past Surgical History:  Procedure Laterality Date   CERVICAL BIOPSY     Dr. Kenton Kingfisher with [facility]   COLONOSCOPY     COLONOSCOPY     DILATION AND CURETTAGE OF UTERUS     MELANOMA EXCISION     TONSILLECTOMY      Family Psychiatric History: Reviewed family psychiatric history from progress note on 02/12/2018.  Family History:  Family History  Problem Relation Age of Onset   Heart  attack Father    Cancer Father        melanoma   Cancer Maternal Aunt        breast   Breast cancer Maternal Aunt    Cancer Paternal Aunt        breast   Breast cancer Paternal Aunt    Breast cancer Cousin     Social History: Reviewed social history from progress note on 02/12/2018. Social History   Socioeconomic History   Marital status: Divorced    Spouse name: Not on file   Number of children: 1   Years of education: Not on file   Highest education level: Associate degree: occupational, Hotel manager, or vocational program  Occupational History    Comment: full time  Tobacco Use   Smoking status: Former    Types: Cigarettes    Quit date: 02/12/1990    Years since quitting: 31.7   Smokeless tobacco: Never  Vaping Use   Vaping Use: Never used  Substance and Sexual Activity   Alcohol use: No    Alcohol/week: 0.0 standard drinks   Drug use: No   Sexual activity: Not Currently  Other Topics Concern   Not on file  Social History Narrative   Not on file   Social Determinants of Health   Financial Resource Strain: Low Risk  Difficulty of Paying Living Expenses: Not hard at all  Food Insecurity: No Food Insecurity   Worried About [place] in the Last Year: Never true   Ran Out of Food in the Last Year: Never true  Transportation Needs: No Transportation Needs   Lack of Transportation (Medical): No   Lack of Transportation (Non-Medical): No  Physical Activity: Insufficiently Active   Days of Exercise per Week: 1 day   Minutes of Exercise per Session: 20 min  Stress: Stress Concern Present   Feeling of Stress : To some extent  Social Connections: Moderately Isolated   Frequency of Communication with Friends and Family: More than three times a week   Frequency of Social Gatherings with Friends and Family: More than three times a week   Attends Religious Services: 1 to 4 times per year   Active Member of [employer] or Organizations: No   Attends Archivist  Meetings: Never   Marital Status: Divorced    Allergies:  Allergies  Allergen Reactions   Penicillins Hives and Other (See Comments)    delusional   Sulfa Antibiotics Hives and Other (See Comments)    dellusional   Prednisone Other (See Comments)    Heart racing    Metabolic Disorder Labs: Lab Results  Component Value Date   HGBA1C 5.3 12/17/2020   Lab Results  Component Value Date   PROLACTIN 10.6 12/17/2020   PROLACTIN 8.2 08/25/2019   Lab Results  Component Value Date   CHOL 226 (H) 07/29/2021   TRIG 181 (H) 07/29/2021   HDL 50 07/29/2021   CHOLHDL 4.5 (H) 07/29/2021   LDLCALC 143 (H) 07/29/2021   LDLCALC 141 (H) 08/25/2019   Lab Results  Component Value Date   TSH 1.700 07/29/2021   TSH 3.530 12/17/2020    Therapeutic Level Labs: No results found for: LITHIUM Lab Results  Component Value Date   VALPROATE 17 (L) 12/17/2020   VALPROATE 84 08/25/2019   No components found for:  CBMZ  Current Medications: Current Outpatient Medications  Medication Sig Dispense Refill   acetaminophen (TYLENOL) 650 MG CR tablet Take by mouth.     Ascorbic Acid (VITAMIN C) 1000 MG tablet Take 2,000 mg by mouth daily.     Black Elderberry 50 MG/5ML SYRP Take by mouth.     Cholecalciferol 25 MCG (1000 UT) tablet Take by mouth.     diazepam (VALIUM) 5 MG tablet Take 1 tablet (5 mg total) by mouth every 8 (eight) hours as needed for muscle spasms. 20 tablet 0   divalproex (DEPAKOTE ER) 500 MG 24 hr tablet TAKE 2 TABLETS BY MOUTH  DAILY 180 tablet 3   doxycycline (VIBRAMYCIN) 100 MG capsule Take 1 capsule (100 mg total) by mouth 2 (two) times daily. 20 capsule 0   hydrOXYzine (ATARAX/VISTARIL) 25 MG tablet Take 0.5-1 tablets (12.5-25 mg total) by mouth 3 (three) times daily as needed. Anxiety, sleep 270 tablet 1   LATUDA 60 MG TABS TAKE 1 TABLET BY MOUTH  DAILY WITH SUPPER 90 tablet 3   neomycin-polymyxin b-dexamethasone (MAXITROL) 3.5-10000-0.1 SUSP   0   pimecrolimus (ELIDEL) 1  % cream Apply topically.     tolterodine (DETROL LA) 2 MG 24 hr capsule TAKE 1 CAPSULE(2 MG) BY MOUTH DAILY 30 capsule 0   triamcinolone cream (KENALOG) 0.1 % APPLY TO AFFECTED AREA TWICE A DAY     traZODone (DESYREL) 100 MG tablet Take 1-1.5 tablets (100-150 mg total) by mouth at bedtime as  needed for sleep. 135 tablet 3   No current facility-administered medications for this visit.     Musculoskeletal: Strength & Muscle Tone: within normal limits Gait & Station: normal Patient leans: N/A  Psychiatric Specialty Exam: Review of Systems  Psychiatric/Behavioral:         Grieving  All other systems reviewed and are negative.  Blood pressure 128/82, pulse 68, temperature 98.3 F (36.8 C), temperature source Temporal, weight 202 lb 3.2 oz (91.7 kg).Body mass index is 29.86 kg/m.  General Appearance: Casual  Eye Contact:  Fair  Speech:  Clear and Coherent  Volume:  Normal  Mood:   Grieving  Affect:  Tearful  Thought Process:  Goal Directed and Descriptions of Associations: Intact  Orientation:  Full (Time, Place, and Person)  Thought Content: Logical   Suicidal Thoughts:  No  Homicidal Thoughts:  No  Memory:  Immediate;   Fair Recent;   Fair Remote;   Fair  Judgement:  Fair  Insight:  Fair  Psychomotor Activity:  Normal  Concentration:  Concentration: Fair and Attention Span: Fair  Recall:  [employer] of Knowledge: Fair  Language: Fair  Akathisia:  No  Handed:  Right  AIMS (if indicated): done, 0  Assets:  Communication Skills Desire for Improvement Housing Social Support  ADL's:  Intact  Cognition: WNL  Sleep:  Fair   Screenings: [place] Office Visit from 08/19/2021 in [place] Office Visit from 02/12/2018 in [place] Total Score 0 0      GAD-7    [place] Visit from 08/19/2021 in [place] Office Visit from 07/28/2021 in [facility] Video Visit from 03/24/2021 in [place]  Total GAD-7 Score 3 0 2      PHQ2-9    [place] Visit from 10/28/2021 in [place] Office Visit from 08/19/2021 in [place] Visit from 07/28/2021 in [facility] Video Visit from 03/24/2021 in [place] Visit from 08/13/2020 in [facility]  PHQ-2 Total Score 2 0 2 0 0  PHQ-9 Total Score 3 -- 3 -- --      [place] Office Visit from 10/28/2021 in [place] Office Visit from 08/19/2021 in [place] Video Visit from 03/24/2021 in [place] No Risk No Risk No Risk        Assessment and Plan: YAMINAH CLAYBORN is a 55 year old Caucasian female, employed, divorced, lives in [place], has a history of bipolar disorder was evaluated in office today.  Patient is currently struggling with grief.  Patient however is not interested in medication changes.  Discussed plan as noted below.  Plan Bipolar disorder in remission Depakote ER 1000 mg at bedtime Latuda 60 mg p.o. daily with meals Depakote level-12/17/2020-17-subtherapeutic. Patient is not interested in dosage changes at this time. AIMS - 0  Insomnia-improving Trazodone 100-150 mg p.o. nightly as needed Continued sleep hygiene techniques  Bereavement-unstable Patient is not interested in psychotherapy sessions Hydroxyzine 12.5-25 mg p.o. 3 times daily as needed Provided brief supportive therapy.  Encouraged patient to follow up with primary care provider for her abnormal lipid panel.  Also discussed reducing the dosage of Latuda.  Patient is not interested at this time.  Follow-up in clinic in 3 months or sooner if needed.  This note  was generated in part or whole with voice  recognition software. Voice recognition is usually quite accurate but there are transcription errors that can and very often do occur. I apologize for any typographical errors that were not detected and corrected.      Ursula Alert, MD 10/28/2021, 12:31 PM

## 2021-11-04 ENCOUNTER — Ambulatory Visit: Payer: Managed Care, Other (non HMO) | Admitting: Obstetrics & Gynecology

## 2022-01-11 ENCOUNTER — Other Ambulatory Visit: Payer: Self-pay | Admitting: Psychiatry

## 2022-01-11 DIAGNOSIS — G4701 Insomnia due to medical condition: Secondary | ICD-10-CM

## 2022-01-11 DIAGNOSIS — F3176 Bipolar disorder, in full remission, most recent episode depressed: Secondary | ICD-10-CM

## 2022-01-20 ENCOUNTER — Ambulatory Visit: Payer: 59 | Admitting: Psychiatry

## 2022-02-03 ENCOUNTER — Ambulatory Visit: Payer: Managed Care, Other (non HMO) | Admitting: Obstetrics & Gynecology

## 2022-02-15 ENCOUNTER — Other Ambulatory Visit: Payer: Self-pay | Admitting: Obstetrics & Gynecology

## 2022-02-15 ENCOUNTER — Encounter: Payer: Self-pay | Admitting: Obstetrics & Gynecology

## 2022-02-15 ENCOUNTER — Ambulatory Visit (INDEPENDENT_AMBULATORY_CARE_PROVIDER_SITE_OTHER): Payer: Managed Care, Other (non HMO) | Admitting: Obstetrics & Gynecology

## 2022-02-15 VITALS — BP 120/70 | Ht 69.0 in | Wt 204.0 lb

## 2022-02-15 DIAGNOSIS — Z124 Encounter for screening for malignant neoplasm of cervix: Secondary | ICD-10-CM

## 2022-02-15 DIAGNOSIS — Z01419 Encounter for gynecological examination (general) (routine) without abnormal findings: Secondary | ICD-10-CM | POA: Diagnosis not present

## 2022-02-15 DIAGNOSIS — Z1231 Encounter for screening mammogram for malignant neoplasm of breast: Secondary | ICD-10-CM | POA: Diagnosis not present

## 2022-02-15 MED ORDER — MIRABEGRON ER 25 MG PO TB24: 25.0000 mg | ORAL_TABLET | Freq: Every day | ORAL | 11 refills | Status: DC

## 2022-02-15 NOTE — Progress Notes (Signed)
?HPI: ?     Ms. EMILYANNE MCGOUGH is a 56 y.o. G1P1001 who LMP was in the past, she presents today for her annual examination.  The patient has no complaints today. The patient is sexually active. Herlast pap: approximate date 2019 and was normal and last mammogram: approximate date 2022 and was normal.  The patient does perform self breast exams.  There is no notable family history of breast or ovarian cancer in her family. The patient is not taking hormone replacement therapy. Patient denies post-menopausal vaginal bleeding.   The patient has regular exercise: yes. The patient denies current symptoms of depression.   ? ?GYN Hx: ?Last Colonoscopy:2 years ago. Normal.  ?Last DEXA:  never  ago.   ? ?PMHx: ?Past Medical History:  ?Diagnosis Date  ? Acid reflux   ? Adnexal mass   ? Bipolar disorder ([place])   ? Depression   ? Depression   ? Herpes   ? Irregular menstrual bleeding   ? Melanoma ([place])   ? ?Past Surgical History:  ?Procedure Laterality Date  ? CERVICAL BIOPSY    ? Dr. Kenton Kingfisher with [facility]  ? COLONOSCOPY    ? COLONOSCOPY    ? DILATION AND CURETTAGE OF UTERUS    ? MELANOMA EXCISION    ? TONSILLECTOMY    ? ?Family History  ?Problem Relation Age of Onset  ? Heart attack Father   ? Cancer Father   ?     melanoma  ? Cancer Maternal Aunt   ?     breast  ? Breast cancer Maternal Aunt   ? Cancer Paternal Aunt   ?     breast  ? Breast cancer Paternal Aunt   ? Breast cancer Cousin   ? ?Social History  ? ?Tobacco Use  ? Smoking status: Former  ?  Types: Cigarettes  ?  Quit date: 02/12/1990  ?  Years since quitting: 32.0  ? Smokeless tobacco: Never  ?Vaping Use  ? Vaping Use: Never used  ?Substance Use Topics  ? Alcohol use: No  ?  Alcohol/week: 0.0 standard drinks  ? Drug use: No  ? ? ?Current Outpatient Medications:  ?  acetaminophen (TYLENOL) 650 MG CR tablet, Take by mouth., Disp: , Rfl:  ?  Ascorbic Acid (VITAMIN C) 1000 MG tablet, Take 2,000 mg by mouth daily., Disp: , Rfl:  ?  Black Elderberry 50 MG/5ML SYRP,  Take by mouth., Disp: , Rfl:  ?  Cholecalciferol 25 MCG (1000 UT) tablet, Take by mouth., Disp: , Rfl:  ?  divalproex (DEPAKOTE ER) 500 MG 24 hr tablet, TAKE 2 TABLETS BY MOUTH  DAILY, Disp: 180 tablet, Rfl: 3 ?  doxycycline (VIBRAMYCIN) 100 MG capsule, Take 1 capsule (100 mg total) by mouth 2 (two) times daily., Disp: 20 capsule, Rfl: 0 ?  hydrOXYzine (ATARAX) 25 MG tablet, TAKE 1/2 TO 1 TABLET BY  MOUTH 3 TIMES DAILY AS  NEEDED FOR ANXIETY/SLEEP, Disp: 270 tablet, Rfl: 1 ?  LATUDA 60 MG TABS, TAKE 1 TABLET BY MOUTH  DAILY WITH SUPPER, Disp: 90 tablet, Rfl: 3 ?  mirabegron ER (MYRBETRIQ) 25 MG TB24 tablet, Take 1 tablet (25 mg total) by mouth daily., Disp: 30 tablet, Rfl: 11 ?  pimecrolimus (ELIDEL) 1 % cream, Apply topically., Disp: , Rfl:  ?  traZODone (DESYREL) 100 MG tablet, Take 1-1.5 tablets (100-150 mg total) by mouth at bedtime as needed for sleep., Disp: 135 tablet, Rfl: 3 ?  triamcinolone cream (KENALOG) 0.1 %,  APPLY TO AFFECTED AREA TWICE A DAY, Disp: , Rfl:  ?  diazepam (VALIUM) 5 MG tablet, Take 1 tablet (5 mg total) by mouth every 8 (eight) hours as needed for muscle spasms. (Patient not taking: Reported on 02/15/2022), Disp: 20 tablet, Rfl: 0 ?  neomycin-polymyxin b-dexamethasone (MAXITROL) 3.5-10000-0.1 SUSP, , Disp: , Rfl: 0 ?Allergies: Penicillins, Sulfa antibiotics, and Prednisone ? ?Review of Systems  ?Constitutional:  Negative for chills, fever and malaise/fatigue.  ?HENT:  Negative for congestion, sinus pain and sore throat.   ?Eyes:  Negative for blurred vision and pain.  ?Respiratory:  Negative for cough and wheezing.   ?Cardiovascular:  Negative for chest pain and leg swelling.  ?Gastrointestinal:  Negative for abdominal pain, constipation, diarrhea, heartburn, nausea and vomiting.  ?Genitourinary:  Negative for dysuria, frequency, hematuria and urgency.  ?Musculoskeletal:  Negative for back pain, joint pain, myalgias and neck pain.  ?Skin:  Negative for itching and rash.  ?Neurological:   Negative for dizziness, tremors and weakness.  ?Endo/Heme/Allergies:  Does not bruise/bleed easily.  ?Psychiatric/Behavioral:  Negative for depression. The patient is not nervous/anxious and does not have insomnia.   ?All other systems reviewed and are negative. ? ?Objective: ?BP 120/70   Ht '5\' 9"'$  (1.753 m)   Wt 204 lb (92.5 kg)   BMI 30.13 kg/m?   ?Filed Weights  ? 02/15/22 1350  ?Weight: 204 lb (92.5 kg)  ? Body mass index is 30.13 kg/m?Marland Kitchen ?Physical Exam ?Constitutional:   ?   General: She is not in acute distress. ?   Appearance: She is well-developed.  ?Genitourinary:  ?   Bladder, rectum and urethral meatus normal.  ?   No lesions in the vagina.  ?   Right Labia: No rash, tenderness or lesions. ?   Left Labia: No tenderness, lesions or rash. ?   No vaginal bleeding.  ? ?   Right Adnexa: not tender and no mass present. ?   Left Adnexa: not tender and no mass present. ?   No cervical motion tenderness, friability, lesion or polyp.  ?   Uterus is not enlarged.  ?   No uterine mass detected. ?   Pelvic exam was performed with patient in the lithotomy position.  ?Breasts: ?   Right: No mass, skin change or tenderness.  ?   Left: No mass, skin change or tenderness.  ?HENT:  ?   Head: Normocephalic and atraumatic. No laceration.  ?   Right Ear: Hearing normal.  ?   Left Ear: Hearing normal.  ?   Mouth/Throat:  ?   Pharynx: Uvula midline.  ?Eyes:  ?   Pupils: Pupils are equal, round, and reactive to light.  ?Neck:  ?   Thyroid: No thyromegaly.  ?Cardiovascular:  ?   Rate and Rhythm: Normal rate and regular rhythm.  ?   Heart sounds: No murmur heard. ?  No friction rub. No gallop.  ?Pulmonary:  ?   Effort: Pulmonary effort is normal. No respiratory distress.  ?   Breath sounds: Normal breath sounds. No wheezing.  ?Abdominal:  ?   General: Bowel sounds are normal. There is no distension.  ?   Palpations: Abdomen is soft.  ?   Tenderness: There is no abdominal tenderness. There is no rebound.  ?Musculoskeletal:     ?    General: Normal range of motion.  ?   Cervical back: Normal range of motion and neck supple.  ?Neurological:  ?   Mental Status: She is alert  and oriented to person, place, and time.  ?   Cranial Nerves: No cranial nerve deficit.  ?Skin: ?   General: Skin is warm and dry.  ?Psychiatric:     ?   Judgment: Judgment normal.  ?Vitals reviewed.  ? ? ?Assessment: Annual Exam ?1. Women's annual routine gynecological examination   ?2. Screening for cervical cancer   ?3. Encounter for screening mammogram for malignant neoplasm of breast   ? ? ?Plan: ?           ?1.  Cervical Screening-  Pap smear done today ? ?2. Breast screening- Exam annually and mammogram scheduled ? ?3. Colonoscopy every 10 years, Hemoccult testing after age 15 ? ?41. Labs managed by PCP ? ?5. Counseling for hormonal therapy: none ? ?            6. FRAX - FRAX score for assessing the 10 year probability for fracture calculated and discussed today.  Based on age and score today, DEXA is not currently scheduled. ? ? 7. OAB.  Try Mirbetriq.  Risks and benefits and side effects discussed. ? ?  F/U ? Return for and Annual when due. ? ?Barnett Applebaum, MD, [place] ?[place], [place] ?02/15/2022  2:38 PM ? ? ?

## 2022-02-15 NOTE — Patient Instructions (Signed)
Mirabegron Extended-release Oral Tablets ?What is this medication? ?MIRABEGRON (MIR a BEG ron) is used to treat overactive bladder. This medicine reduces the amount of bathroom visits. It may also help to control wetting accidents. ?This medicine may be used for other purposes; ask your health care provider or pharmacist if you have questions. ?COMMON BRAND NAME(S): Myrbetriq ?What should I tell my care team before I take this medication? ?They need to know if you have any of these conditions: ?high blood pressure ?kidney disease ?liver disease ?problems urinating ?prostate disease ?an unusual or allergic reaction to mirabegron, other medicines, foods, dyes, or preservatives ?pregnant or trying to get pregnant ?breast-feeding ?How should I use this medication? ?Take this medicine by mouth with water. Take it as directed on the prescription label at the same time every day. Do not cut, crush or chew this medicine. Swallow the tablets whole. Adults can take it with or without food. Children should take it with food. Keep taking it unless your health care provider tells you to stop. ?Talk to your health care provider about the use of this medicine in children. While it may be prescribed for children as young as 3 years for selected conditions, precautions do apply. ?Overdosage: If you think you have taken too much of this medicine contact a poison control center or emergency room at once. ?NOTE: This medicine is only for you. Do not share this medicine with others. ?What if I miss a dose? ?If you miss a dose, take it as soon as you can unless it is more than 12 hours late. If it is more than 12 hours late, skip the missed dose. Take the next dose at the normal time. ?What may interact with this medication? ?codeine ?desipramine ?digoxin ?flecainide ?MAOIs like Carbex, Eldepryl, Marplan, Nardil, and Parnate ?methadone ?metoprolol ?pimozide ?propafenone ?thioridazine ?warfarin ?This list may not describe all possible  interactions. Give your health care provider a list of all the medicines, herbs, non-prescription drugs, or dietary supplements you use. Also tell them if you smoke, drink alcohol, or use illegal drugs. Some items may interact with your medicine. ?What should I watch for while using this medication? ?Visit your health care provider for regular checks on your progress. Tell your health care provider if your symptoms do not start to get better or if they get worse. Check your blood pressure as directed. Ask your doctor or health care professional what your blood pressure should be and when you should contact him or her. ?What side effects may I notice from receiving this medication? ?Side effects that you should report to your doctor or health care professional as soon as possible: ?allergic reactions (skin rash, itching or hives; swelling of the face, lips, or tongue) ?increase in blood pressure ?fast, irregular heartbeat ?infection (fever, chills, pain or trouble passing urine) ?trouble passing urine ?Side effects that usually do not require medical attention (report these to your doctor or health care professional if they continue or are bothersome): ?constipation ?dry mouth ?headache ?nausea ?sore throat ?This list may not describe all possible side effects. Call your doctor for medical advice about side effects. You may report side effects to FDA at 1-800-FDA-1088. ?Where should I keep my medication? ?Keep out of the reach of children and pets. ?Store at room temperature between 20 and 25 degrees C (68 and 77 degrees F). Get rid of any unused medicine after the expiration date. ?To get rid of medicines that are no longer needed or have expired: ?Take the  medicine to a medicine take-back program. Check with your pharmacy or law enforcement to find a location. ?If you cannot return the medicine, check the label or package insert to see if the medicine should be thrown out in the garbage or flushed down the toilet.  If you are not sure, ask your health care provider. If it is safe to put it in the trash, empty the medicine out of the container. Mix the medicine with cat litter, dirt, coffee grounds, or other unwanted substance. Seal the mixture in a bag or container. Put it in the trash. ?NOTE: This sheet is a summary. It may not cover all possible information. If you have questions about this medicine, talk to your doctor, pharmacist, or health care provider. ?? 2022 Elsevier/Gold Standard (2020-07-30 00:00:00) ? ?

## 2022-02-20 ENCOUNTER — Telehealth: Payer: Self-pay

## 2022-02-20 MED ORDER — MIRABEGRON ER 25 MG PO TB24: 25.0000 mg | ORAL_TABLET | Freq: Every day | ORAL | 4 refills | Status: DC

## 2022-02-20 NOTE — Telephone Encounter (Signed)
Pt needed rx Myrbetriq changed for 90 days so insurance would cover it ?

## 2022-02-23 LAB — IGP, APTIMA HPV: HPV Aptima: NEGATIVE

## 2022-03-03 ENCOUNTER — Ambulatory Visit (INDEPENDENT_AMBULATORY_CARE_PROVIDER_SITE_OTHER): Payer: 59 | Admitting: Psychiatry

## 2022-03-03 ENCOUNTER — Encounter: Payer: Self-pay | Admitting: Psychiatry

## 2022-03-03 VITALS — BP 106/73 | HR 67 | Temp 98.3°F | Wt 204.2 lb

## 2022-03-03 DIAGNOSIS — G4701 Insomnia due to medical condition: Secondary | ICD-10-CM

## 2022-03-03 DIAGNOSIS — Z79899 Other long term (current) drug therapy: Secondary | ICD-10-CM | POA: Diagnosis not present

## 2022-03-03 DIAGNOSIS — Z634 Disappearance and death of family member: Secondary | ICD-10-CM | POA: Diagnosis not present

## 2022-03-03 DIAGNOSIS — F3176 Bipolar disorder, in full remission, most recent episode depressed: Secondary | ICD-10-CM

## 2022-03-03 NOTE — Progress Notes (Signed)
[facility] OP Progress Note ? ?03/03/2022 10:54 AM ?Barbara [name]  ?MRN:  [id number] ? ?Chief Complaint:  ?Chief Complaint  ?Patient presents with  ? Follow-up: 56 year old Caucasian female with history of bipolar disorder, insomnia, grief, presented for medication management.  ? ?HPI: Barbara [name] is a 56 year old Caucasian female, lives in [place], employed, divorced, has a history of bipolar disorder was evaluated in office today. ? ?Patient reports she continues to grieve the loss of her dad.  She also has other psychosocial stressors, an aunt well as cousin and a friend's wife got diagnosed with cancer.  Patient reports she also lost a friend whom she considered as a brother.  She reports she however has been talking to her family and her friends and coping okay so far. ? ?Reports sleep as restless on and off.  Last night since she heard the news of her friend's cancer diagnosis she had sleep problems.  Patient reports she also has to urinate in the middle of the night which does have an impact on her sleep.  She was recently started on Myrbetriq which seems to help.  She does have trazodone as well as hydroxyzine available.  Agreeable to start working on sleep hygiene. ? ?Denies any significant mood lability.  Denies any manic or hypomanic symptoms. ? ?Reports anxiety symptoms are managed currently on the medications. ? ?Currently compliant on medications.  Denies side effects. ? ?Reports work is going well. ? ?Denies any suicidality, homicidality or perceptual disturbances. ? ?Patient denies any other concerns today. ? ?Visit Diagnosis:  ?  ICD-10-CM   ?1. Bipolar disorder, in full remission, most recent episode depressed (HCC)  F31.76 Valproic acid level  ?  Hepatic function panel  ?  Sodium  ?  Platelet count  ?  ?2. Insomnia due to medical condition  G47.01   ? anxiety  ?  ?3. Bereavement  Z63.4   ?  ?4. High risk medication use  Z79.899 Valproic acid level  ?  Hepatic function panel  ?  Sodium  ?   Platelet count  ?  ? ? ?Past Psychiatric History: Reviewed past psychiatric history from progress note on 02/12/2018. ? ?Past Medical History:  ?Past Medical History:  ?Diagnosis Date  ? Acid reflux   ? Adnexal mass   ? Bipolar disorder ([place])   ? Depression   ? Depression   ? Herpes   ? Irregular menstrual bleeding   ? Melanoma ([place])   ?  ?Past Surgical History:  ?Procedure Laterality Date  ? CERVICAL BIOPSY    ? Dr. Kenton Kingfisher with [facility]  ? COLONOSCOPY    ? COLONOSCOPY    ? DILATION AND CURETTAGE OF UTERUS    ? MELANOMA EXCISION    ? TONSILLECTOMY    ? ? ?Family Psychiatric History: Reviewed family psychiatric history from progress note from 02/12/2018. ? ?Family History:  ?Family History  ?Problem Relation Age of Onset  ? Heart attack Father   ? Cancer Father   ?     melanoma  ? Cancer Maternal Aunt   ?     breast  ? Breast cancer Maternal Aunt   ? Cancer Paternal Aunt   ?     breast  ? Breast cancer Paternal Aunt   ? Breast cancer Cousin   ? ? ?Social History: Reviewed social history from progress note on 02/12/2018.  She ?Social History  ? ?Socioeconomic History  ? Marital status: Divorced  ?  Spouse name: Not on  file  ? Number of children: 1  ? Years of education: Not on file  ? Highest education level: Associate degree: occupational, Hotel manager, or vocational program  ?Occupational History  ?  Comment: full time  ?Tobacco Use  ? Smoking status: Former  ?  Types: Cigarettes  ?  Quit date: 02/12/1990  ?  Years since quitting: 32.0  ? Smokeless tobacco: Never  ?Vaping Use  ? Vaping Use: Never used  ?Substance and Sexual Activity  ? Alcohol use: No  ?  Alcohol/week: 0.0 standard drinks  ? Drug use: No  ? Sexual activity: Not Currently  ?Other Topics Concern  ? Not on file  ?Social History Narrative  ? Not on file  ? ?Social Determinants of Health  ? ?Financial Resource Strain: Low Risk   ? Difficulty of Paying Living Expenses: Not hard at all  ?Food Insecurity: No Food Insecurity  ? Worried About Charity fundraiser  in the Last Year: Never true  ? Ran Out of Food in the Last Year: Never true  ?Transportation Needs: No Transportation Needs  ? Lack of Transportation (Medical): No  ? Lack of Transportation (Non-Medical): No  ?Physical Activity: Insufficiently Active  ? Days of Exercise per Week: 1 day  ? Minutes of Exercise per Session: 20 min  ?Stress: Stress Concern Present  ? Feeling of Stress : To some extent  ?Social Connections: Moderately Isolated  ? Frequency of Communication with Friends and Family: More than three times a week  ? Frequency of Social Gatherings with Friends and Family: More than three times a week  ? Attends Religious Services: 1 to 4 times per year  ? Active Member of Clubs or Organizations: No  ? Attends Archivist Meetings: Never  ? Marital Status: Divorced  ? ? ?Allergies:  ?Allergies  ?Allergen Reactions  ? Penicillins Hives and Other (See Comments)  ?  delusional  ? Sulfa Antibiotics Hives and Other (See Comments)  ?  dellusional ?dellusional ?Other reaction(s): Other (See Comments) ?dellusional  ? Prednisone Other (See Comments)  ?  Heart racing  ? ? ?Metabolic Disorder Labs: ?Lab Results  ?Component Value Date  ? HGBA1C 5.3 12/17/2020  ? ?Lab Results  ?Component Value Date  ? PROLACTIN 10.6 12/17/2020  ? PROLACTIN 8.2 08/25/2019  ? ?Lab Results  ?Component Value Date  ? CHOL 226 (H) 07/29/2021  ? TRIG 181 (H) 07/29/2021  ? HDL 50 07/29/2021  ? CHOLHDL 4.5 (H) 07/29/2021  ? LDLCALC 143 (H) 07/29/2021  ? LDLCALC 141 (H) 08/25/2019  ? ?Lab Results  ?Component Value Date  ? TSH 1.700 07/29/2021  ? TSH 3.530 12/17/2020  ? ? ?Therapeutic Level Labs: ?No results found for: LITHIUM ?Lab Results  ?Component Value Date  ? VALPROATE 17 (L) 12/17/2020  ? VALPROATE 84 08/25/2019  ? ?No components found for:  CBMZ ? ?Current Medications: ?Current Outpatient Medications  ?Medication Sig Dispense Refill  ? acetaminophen (TYLENOL) 650 MG CR tablet Take by mouth.    ? Ascorbic Acid (VITAMIN C) 1000 MG  tablet Take 2,000 mg by mouth daily.    ? Black Elderberry 50 MG/5ML SYRP Take by mouth.    ? Cholecalciferol 25 MCG (1000 UT) tablet Take by mouth.    ? diazepam (VALIUM) 5 MG tablet Take 1 tablet (5 mg total) by mouth every 8 (eight) hours as needed for muscle spasms. 20 tablet 0  ? divalproex (DEPAKOTE ER) 500 MG 24 hr tablet TAKE 2 TABLETS BY MOUTH  DAILY 180 tablet 3  ? doxycycline (VIBRAMYCIN) 100 MG capsule Take 1 capsule (100 mg total) by mouth 2 (two) times daily. 20 capsule 0  ? hydrOXYzine (ATARAX) 25 MG tablet TAKE 1/2 TO 1 TABLET BY  MOUTH 3 TIMES DAILY AS  NEEDED FOR ANXIETY/SLEEP 270 tablet 1  ? LATUDA 60 MG TABS TAKE 1 TABLET BY MOUTH  DAILY WITH SUPPER 90 tablet 3  ? mirabegron ER (MYRBETRIQ) 25 MG TB24 tablet Take 1 tablet (25 mg total) by mouth daily. 90 tablet 4  ? neomycin-polymyxin b-dexamethasone (MAXITROL) 3.5-10000-0.1 SUSP   0  ? pimecrolimus (ELIDEL) 1 % cream Apply topically.    ? traZODone (DESYREL) 100 MG tablet Take 1-1.5 tablets (100-150 mg total) by mouth at bedtime as needed for sleep. 135 tablet 3  ? triamcinolone cream (KENALOG) 0.1 % APPLY TO AFFECTED AREA TWICE A DAY    ? azelastine (ASTELIN) 0.1 % nasal spray Place into both nostrils.    ? meclizine (ANTIVERT) 12.5 MG tablet Take 12.5 mg by mouth 3 (three) times daily.    ? ?No current facility-administered medications for this visit.  ? ? ? ?Musculoskeletal: ?Strength & Muscle Tone: within normal limits ?Gait & Station: normal ?Patient leans: N/A ? ?Psychiatric Specialty Exam: ?Review of Systems  ?Psychiatric/Behavioral:  Positive for sleep disturbance.   ?     Grieving  ?All other systems reviewed and are negative.  ?Blood pressure 106/73, pulse 67, temperature 98.3 ?F (36.8 ?C), temperature source Temporal, weight 204 lb 3.2 oz (92.6 kg).Body mass index is 30.16 kg/m?.  ?General Appearance: Casual  ?Eye Contact:  Fair  ?Speech:  Clear and Coherent  ?Volume:  Normal  ?Mood:   Grieving  ?Affect:  Congruent  ?Thought Process:   Goal Directed and Descriptions of Associations: Intact  ?Orientation:  Full (Time, Place, and Person)  ?Thought Content: Logical   ?Suicidal Thoughts:  No  ?Homicidal Thoughts:  No  ?Memory:  Immediate;   Fair

## 2022-03-22 ENCOUNTER — Ambulatory Visit
Admission: RE | Admit: 2022-03-22 | Discharge: 2022-03-22 | Disposition: A | Discharge status: Home / Self Care | Payer: Managed Care, Other (non HMO) | Source: Ambulatory Visit | Attending: Obstetrics & Gynecology | Admitting: Obstetrics & Gynecology

## 2022-03-22 ENCOUNTER — Other Ambulatory Visit: Payer: Self-pay | Admitting: Psychiatry

## 2022-03-22 DIAGNOSIS — Z1231 Encounter for screening mammogram for malignant neoplasm of breast: Secondary | ICD-10-CM | POA: Insufficient documentation

## 2022-03-23 LAB — SODIUM: Sodium: 142 mmol/L (ref 134–144)

## 2022-03-23 LAB — HEPATIC FUNCTION PANEL
ALT: 14 IU/L (ref 0–32)
AST: 17 IU/L (ref 0–40)
Albumin: 4.7 g/dL (ref 3.8–4.9)
Alkaline Phosphatase: 47 IU/L (ref 44–121)
Bilirubin Total: 0.2 mg/dL (ref 0.0–1.2)
Bilirubin, Direct: <0.1 mg/dL (ref 0.00–0.40)
Total Protein: 7.1 g/dL (ref 6.0–8.5)

## 2022-03-23 LAB — PLATELET COUNT: Platelets: 224 10*3/uL (ref 150–450)

## 2022-03-23 LAB — VALPROIC ACID LEVEL: Valproic Acid Lvl: 40 ug/mL — ABNORMAL LOW (ref 50–100)

## 2022-03-24 ENCOUNTER — Telehealth: Payer: Self-pay | Admitting: Psychiatry

## 2022-03-24 NOTE — Telephone Encounter (Signed)
Contacted patient to discuss labs including Depakote level-subtherapeutic.  Will not change dosage since mood symptoms currently stable.  Otherwise platelet count hepatic function and sodium level-within normal limits. ?

## 2022-06-09 ENCOUNTER — Ambulatory Visit: Payer: 59 | Admitting: Psychiatry

## 2022-06-16 ENCOUNTER — Encounter: Payer: Self-pay | Admitting: Psychiatry

## 2022-06-16 ENCOUNTER — Ambulatory Visit: Payer: 59 | Admitting: Psychiatry

## 2022-06-16 VITALS — BP 111/73 | HR 71 | Temp 97.8°F | Wt 203.8 lb

## 2022-06-16 DIAGNOSIS — Z634 Disappearance and death of family member: Secondary | ICD-10-CM | POA: Diagnosis not present

## 2022-06-16 DIAGNOSIS — G4701 Insomnia due to medical condition: Secondary | ICD-10-CM

## 2022-06-16 DIAGNOSIS — Z79899 Other long term (current) drug therapy: Secondary | ICD-10-CM | POA: Diagnosis not present

## 2022-06-16 DIAGNOSIS — F3176 Bipolar disorder, in full remission, most recent episode depressed: Secondary | ICD-10-CM

## 2022-06-16 NOTE — Progress Notes (Signed)
[facility] MD OP Progress Note  06/16/2022 12:46 PM Barbara [name]  MRN:  [id number]  Chief Complaint:  Chief Complaint  Patient presents with   Follow-up: 56 year old Caucasian female with history of bipolar disorder, insomnia, presented for medication management.   HPI: Barbara [name] is a 56 year old Caucasian female, lives in [place], employed, divorced, has a history of bipolar disorder was evaluated in office today.  Patient today reports she is currently doing well with regards to her mood.  Continues to grieve the loss of her dad as well as an aunt who passed away recently.  She however has been coping well.  Denies any significant mood lability, anxiety symptoms.  Patient reports sleep as fair.  She continues to get up in the middle of the night to urinate.  She is on Myrbetriq however that has not helped much.  She is also restricting the amount of fluid in the evening.  Patient denies any suicidality, homicidality or perceptual disturbances.  Reports she is compliant on her medications.  Denies any other concerns today.  Visit Diagnosis:    ICD-10-CM   1. Bipolar disorder, in full remission, most recent episode depressed ([place])  F31.76     2. Insomnia due to medical condition  G47.01    Anxiety    3. Bereavement  Z63.4     4. High risk medication use  Z79.899       Past Psychiatric History: Reviewed past psychiatric history from progress note on 02/12/2018.  Past Medical History:  Past Medical History:  Diagnosis Date   Acid reflux    Adnexal mass    Bipolar disorder ([place])    Depression    Depression    Herpes    Irregular menstrual bleeding    Melanoma ([place])     Past Surgical History:  Procedure Laterality Date   CERVICAL BIOPSY     Dr. Kenton Kingfisher with [facility]   COLONOSCOPY     COLONOSCOPY     DILATION AND CURETTAGE OF UTERUS     MELANOMA EXCISION     TONSILLECTOMY      Family Psychiatric History: Reviewed family psychiatric history from progress  note on 02/12/2018.  Family History:  Family History  Problem Relation Age of Onset   Heart attack Father    Cancer Father        melanoma   Cancer Maternal Aunt        breast   Breast cancer Maternal Aunt    Cancer Paternal Aunt        breast   Breast cancer Paternal Aunt    Breast cancer Cousin     Social History: Reviewed social history from progress note on 02/12/2018. Social History   Socioeconomic History   Marital status: Divorced    Spouse name: Not on file   Number of children: 1   Years of education: Not on file   Highest education level: Associate degree: occupational, Hotel manager, or vocational program  Occupational History    Comment: full time  Tobacco Use   Smoking status: Former    Types: Cigarettes    Quit date: 02/12/1990    Years since quitting: 32.3   Smokeless tobacco: Never  Vaping Use   Vaping Use: Never used  Substance and Sexual Activity   Alcohol use: No    Alcohol/week: 0.0 standard drinks of alcohol   Drug use: No   Sexual activity: Not Currently  Other Topics Concern   Not on file  Social History  Narrative   Not on file   Social Determinants of Health   Financial Resource Strain: Low Risk  (07/28/2021)   Overall Financial Resource Strain (CARDIA)    Difficulty of Paying Living Expenses: Not hard at all  Food Insecurity: No Food Insecurity (07/28/2021)   Hunger Vital Sign    Worried About Running Out of Food in the Last Year: Never true    Ran Out of Food in the Last Year: Never true  Transportation Needs: No Transportation Needs (07/28/2021)   PRAPARE - Hydrologist (Medical): No    Lack of Transportation (Non-Medical): No  Physical Activity: Insufficiently Active (07/28/2021)   Exercise Vital Sign    Days of Exercise per Week: 1 day    Minutes of Exercise per Session: 20 min  Stress: Stress Concern Present (07/28/2021)   Zap    Feeling  of Stress : To some extent  Social Connections: Moderately Isolated (07/28/2021)   Social Connection and Isolation Panel [NHANES]    Frequency of Communication with Friends and Family: More than three times a week    Frequency of Social Gatherings with Friends and Family: More than three times a week    Attends Religious Services: 1 to 4 times per year    Active Member of [employer] or Organizations: No    Attends Archivist Meetings: Never    Marital Status: Divorced    Allergies:  Allergies  Allergen Reactions   Penicillins Hives and Other (See Comments)    delusional   Sulfa Antibiotics Hives and Other (See Comments)    dellusional dellusional Other reaction(s): Other (See Comments) dellusional   Prednisone Other (See Comments)    Heart racing    Metabolic Disorder Labs: Lab Results  Component Value Date   HGBA1C 5.3 12/17/2020   Lab Results  Component Value Date   PROLACTIN 10.6 12/17/2020   PROLACTIN 8.2 08/25/2019   Lab Results  Component Value Date   CHOL 226 (H) 07/29/2021   TRIG 181 (H) 07/29/2021   HDL 50 07/29/2021   CHOLHDL 4.5 (H) 07/29/2021   LDLCALC 143 (H) 07/29/2021   LDLCALC 141 (H) 08/25/2019   Lab Results  Component Value Date   TSH 1.700 07/29/2021   TSH 3.530 12/17/2020    Therapeutic Level Labs: No results found for: "LITHIUM" Lab Results  Component Value Date   VALPROATE 40 (L) 03/22/2022   VALPROATE 17 (L) 12/17/2020   No results found for: "CBMZ"  Current Medications: Current Outpatient Medications  Medication Sig Dispense Refill   acetaminophen (TYLENOL) 650 MG CR tablet Take by mouth.     Ascorbic Acid (VITAMIN C) 1000 MG tablet Take 2,000 mg by mouth daily.     azelastine (ASTELIN) 0.1 % nasal spray Place into both nostrils.     Black Elderberry 50 MG/5ML SYRP Take by mouth.     Cholecalciferol 25 MCG (1000 UT) tablet Take by mouth.     diazepam (VALIUM) 5 MG tablet Take 1 tablet (5 mg total) by mouth every 8 (eight)  hours as needed for muscle spasms. 20 tablet 0   divalproex (DEPAKOTE ER) 500 MG 24 hr tablet TAKE 2 TABLETS BY MOUTH  DAILY 180 tablet 3   doxycycline (VIBRAMYCIN) 100 MG capsule Take 1 capsule (100 mg total) by mouth 2 (two) times daily. 20 capsule 0   hydrOXYzine (ATARAX) 25 MG tablet TAKE 1/2 TO 1 TABLET BY  MOUTH  3 TIMES DAILY AS  NEEDED FOR ANXIETY/SLEEP 270 tablet 1   LATUDA 60 MG TABS TAKE 1 TABLET BY MOUTH  DAILY WITH SUPPER 90 tablet 3   meclizine (ANTIVERT) 12.5 MG tablet Take 12.5 mg by mouth 3 (three) times daily.     mirabegron ER (MYRBETRIQ) 25 MG TB24 tablet Take 1 tablet (25 mg total) by mouth daily. 90 tablet 4   neomycin-polymyxin b-dexamethasone (MAXITROL) 3.5-10000-0.1 SUSP   0   pimecrolimus (ELIDEL) 1 % cream Apply topically.     promethazine-dextromethorphan (PROMETHAZINE-DM) 6.25-15 MG/5ML syrup Take by mouth.     traZODone (DESYREL) 100 MG tablet Take 1-1.5 tablets (100-150 mg total) by mouth at bedtime as needed for sleep. 135 tablet 3   triamcinolone cream (KENALOG) 0.1 % APPLY TO AFFECTED AREA TWICE A DAY     tolterodine (DETROL LA) 2 MG 24 hr capsule  (Patient not taking: Reported on 06/16/2022)     No current facility-administered medications for this visit.     Musculoskeletal: Strength & Muscle Tone: within normal limits Gait & Station: normal Patient leans: N/A  Psychiatric Specialty Exam: Review of Systems  Psychiatric/Behavioral:  Positive for sleep disturbance.   All other systems reviewed and are negative.   Blood pressure 111/73, pulse 71, temperature 97.8 F (36.6 C), temperature source Temporal, weight 203 lb 12.8 oz (92.4 kg).Body mass index is 30.1 kg/m.  General Appearance: Casual  Eye Contact:  Fair  Speech:  Clear and Coherent  Volume:  Normal  Mood:  Euthymic  Affect:  Congruent  Thought Process:  Goal Directed and Descriptions of Associations: Intact  Orientation:  Full (Time, Place, and Person)  Thought Content: Logical   Suicidal  Thoughts:  No  Homicidal Thoughts:  No  Memory:  Immediate;   Fair Recent;   Fair Remote;   Fair  Judgement:  Intact  Insight:  Fair  Psychomotor Activity:  Normal  Concentration:  Concentration: Good and Attention Span: Good  Recall:  Barbara [name] of Knowledge: Fair  Language: Fair  Akathisia:  No  Handed:  Right  AIMS (if indicated): done  Assets:  Communication Skills Desire for [place] Talents/Skills Transportation  ADL's:  Intact  Cognition: WNL  Sleep:   Restless due to the need to urinate at night   Screenings: [place] Office Visit from 06/16/2022 in [facility] from 03/03/2022 in [facility] Office Visit from 08/19/2021 in [facility] Visit from 02/12/2018 in [place] Total Score 0 0 0 [place] Visit from 06/16/2022 in [facility] Visit from 08/19/2021 in [facility] Visit from 07/28/2021 in [facility] Video Visit from 03/24/2021 in [facility]  Total GAD-7 Score 0 3 0 2      PHQ2-9    [place] Visit from 06/16/2022 in [facility] Visit from 03/03/2022 in [facility] Visit from 10/28/2021 in [facility] Visit from 08/19/2021 in [facility] Visit from 07/28/2021 in [facility]  PHQ-2 Total Score 0 0 2 0 2  PHQ-9 Total Score '1 1 3 '$ -- 3      [place] Office Visit from 06/16/2022 in [facility] Visit from 03/03/2022 in [facility] Visit from 10/28/2021 in [facility] No Risk No Risk No Risk        Assessment and Plan: AUBRIELLE STROUD is a 56 year old Caucasian female, employed, divorced, lives in [place], has a history of bipolar disorder was evaluated in office today.  Patient is currently grieving although she is coping better.  Discussed plan as noted below.  Plan Bipolar disorder in remission Depakote ER 1000 mg at bedtime Latuda 60 mg p.o. daily with meals Depakote level-03/22/2022-40-subtherapeutic. Will not make medication changes since she is currently stable.  Insomnia-restless  She has to urinate at night which does affect her sleep.  Currently on Myrbetriq. Trazodone 100-150 mg p.o. nightly as needed Hydroxyzine 12.5-25 mg at bedtime as needed for sleep.  Bereavement-improving Hydroxyzine 12.5-25 mg p.o. 3 times daily as needed   High risk medication use-reviewed and discussed the following labs including Depakote level dated 03/22/2022-40-subtherapeutic,Sodium-142 within normal limits, platelet count-224-within normal limits, hepatic function-within normal limits.  Follow-up in clinic in 4 months or sooner if needed.  This note was generated in part or whole with voice recognition software. Voice recognition is usually quite accurate but there are transcription errors that can and very often do occur. I apologize for any typographical errors that were not detected and corrected.     Ursula Alert, MD 06/16/2022, 12:46 PM

## 2022-07-27 ENCOUNTER — Ambulatory Visit: Payer: Managed Care, Other (non HMO) | Admitting: Nurse Practitioner

## 2022-07-27 ENCOUNTER — Encounter: Payer: Self-pay | Admitting: Nurse Practitioner

## 2022-07-27 VITALS — BP 120/72 | HR 96 | Temp 98.3°F | Resp 18 | Ht 69.0 in | Wt 200.9 lb

## 2022-07-27 DIAGNOSIS — J069 Acute upper respiratory infection, unspecified: Secondary | ICD-10-CM

## 2022-07-27 LAB — POCT RAPID STREP A (OFFICE): Rapid Strep A Screen: NEGATIVE

## 2022-07-27 LAB — POCT INFLUENZA A/B
Influenza A, POC: NEGATIVE
Influenza B, POC: NEGATIVE

## 2022-07-27 MED ORDER — BENZONATATE 100 MG PO CAPS: 200.0000 mg | ORAL_CAPSULE | Freq: Two times a day (BID) | ORAL | 0 refills | Status: DC | PRN

## 2022-07-27 NOTE — Progress Notes (Signed)
BP 120/72   Pulse 96   Temp 98.3 F (36.8 C) (Oral)   Resp 18   Ht '5\' 9"'$  (1.753 m)   Wt 200 lb 14.4 oz (91.1 kg)   SpO2 99%   BMI 29.67 kg/m    Subjective:    Patient ID: Barbara [name], female    DOB: [birth year], 56 y.o.   MRN: [id number]  HPI: Barbara [name] is a 56 y.o. female  Chief Complaint  Patient presents with   Sore Throat   Generalized Body Aches   Ear Pain   Nasal Congestion    All sx onset for 3 days   URI: Patient reports symptoms started three days ago. She says she has a sore throat, body aches, ear pain, fever, fatigue and nasal congestion.  Patient denies any shortness of breath or chest pain.  Will get rapid strep, Covid PCR and rapid flu.  Patient's strep and flu test came back negative.  She has been taking mucinex cold and flu and tylenol.  She says she does take zyrtec every day.  Discussed treatment options if Covid test comes back positive.  Would like to treat COVID if positive.  Discussed side effects. Will do Molnupiravir if positive.  Discussed taking zinc, vitamin d and vitamin c.  Patient can continue to take Mucinex, Tylenol and Zyrtec.  Sent in prescription for Ladona Ridgel gave patient sample of Ryaltris.  Patient needs to push fluids and get rest.  Discussed quarantine protocol commended by [facility].  Can gargle with salt water or use throat lozenges for sore throat.  Relevant past medical, surgical, family and social history reviewed and updated as indicated. Interim medical history since our last visit reviewed. Allergies and medications reviewed and updated.  Review of Systems  Constitutional: positive for fever or negative weight change.  HEENT: Positive for nasal congestion and sore throat Respiratory: positive for cough and negative for  shortness of breath.   Cardiovascular: Negative for chest pain or palpitations.  Gastrointestinal: Negative for abdominal pain, no bowel changes.  Musculoskeletal: Negative for gait problem or joint  swelling.  Skin: Negative for rash.  Neurological: Negative for dizziness or headache.  No other specific complaints in a complete review of systems (except as listed in HPI above).      Objective:    BP 120/72   Pulse 96   Temp 98.3 F (36.8 C) (Oral)   Resp 18   Ht '5\' 9"'$  (1.753 m)   Wt 200 lb 14.4 oz (91.1 kg)   SpO2 99%   BMI 29.67 kg/m   Wt Readings from Last 3 Encounters:  07/27/22 200 lb 14.4 oz (91.1 kg)  02/15/22 204 lb (92.5 kg)  07/28/21 201 lb 3.2 oz (91.3 kg)    Physical Exam Constitutional: Patient appears well-developed and well-nourished. Obese  No distress.  HEENT: head atraumatic, normocephalic, pupils equal and reactive to light, ears TMS clear, neck supple, throat erythematous Cardiovascular: Normal rate, regular rhythm and normal heart sounds.  No murmur heard. No BLE edema. Pulmonary/Chest: Effort normal and breath sounds normal. No respiratory distress. Abdominal: Soft.  There is no tenderness. Psychiatric: Patient has a normal mood and affect. behavior is normal. Judgment and thought content normal.      Assessment & Plan:   Problem List Items Addressed This Visit   None Visit Diagnoses     Viral upper respiratory tract infection    -  Primary   Push fluids get rest quarantine according to  CDC recommendations.  Continue Mucinex, Zyrtec add on Tessalon Perles and Ryaltris   Relevant Medications   benzonatate (TESSALON) 100 MG capsule   Other Relevant Orders   POCT rapid strep A (Completed)   Novel Coronavirus, NAA ([facility])   POCT Influenza A/B (Completed)       Also take vitamin C and D, zinc Your throat can gargle with salt water, use throat lozenges Send in treatment for COVID if positive Follow up plan: Return if symptoms worsen or fail to improve.

## 2022-07-28 ENCOUNTER — Telehealth: Payer: Self-pay | Admitting: Family Medicine

## 2022-07-28 NOTE — Telephone Encounter (Signed)
Pt checking status of 9-14  lab results  Please fu w/ pt

## 2022-07-31 NOTE — Telephone Encounter (Signed)
Covid test results still pending. She will receive a call once results come back and Almyra Free has reviewed them

## 2022-08-01 LAB — NOVEL CORONAVIRUS, NAA: SARS-CoV-2, NAA: NOT DETECTED

## 2022-08-01 LAB — SPECIMEN STATUS REPORT

## 2022-10-27 ENCOUNTER — Ambulatory Visit: Payer: 59 | Admitting: Psychiatry

## 2022-11-21 ENCOUNTER — Other Ambulatory Visit: Payer: Self-pay | Admitting: Psychiatry

## 2022-11-21 DIAGNOSIS — G4701 Insomnia due to medical condition: Secondary | ICD-10-CM

## 2022-12-05 ENCOUNTER — Other Ambulatory Visit: Payer: Self-pay | Admitting: Psychiatry

## 2022-12-05 DIAGNOSIS — G4701 Insomnia due to medical condition: Secondary | ICD-10-CM

## 2022-12-05 DIAGNOSIS — F3176 Bipolar disorder, in full remission, most recent episode depressed: Secondary | ICD-10-CM

## 2022-12-06 ENCOUNTER — Telehealth: Payer: Self-pay | Admitting: Psychiatry

## 2022-12-06 DIAGNOSIS — G4701 Insomnia due to medical condition: Secondary | ICD-10-CM

## 2022-12-06 DIAGNOSIS — F3176 Bipolar disorder, in full remission, most recent episode depressed: Secondary | ICD-10-CM

## 2022-12-06 MED ORDER — HYDROXYZINE HCL 25 MG PO TABS: ORAL_TABLET | ORAL | 0 refills | Status: DC

## 2022-12-06 MED ORDER — DIVALPROEX SODIUM ER 500 MG PO TB24: 1000.0000 mg | ORAL_TABLET | Freq: Every day | ORAL | 0 refills | Status: DC

## 2022-12-06 NOTE — Telephone Encounter (Signed)
I have sent 36-monthsupply of Depakote and hydroxyzine.  Patient however will need an appointment, last visit was in August.  Will send a message to [facility]

## 2022-12-14 NOTE — Telephone Encounter (Signed)
Noted  

## 2022-12-15 ENCOUNTER — Ambulatory Visit: Payer: 59 | Admitting: Psychiatry

## 2022-12-15 ENCOUNTER — Encounter: Payer: Self-pay | Admitting: Psychiatry

## 2022-12-15 VITALS — BP 127/71 | HR 69 | Temp 98.0°F | Ht 69.0 in | Wt 203.6 lb

## 2022-12-15 DIAGNOSIS — F3176 Bipolar disorder, in full remission, most recent episode depressed: Secondary | ICD-10-CM | POA: Diagnosis not present

## 2022-12-15 DIAGNOSIS — G4701 Insomnia due to medical condition: Secondary | ICD-10-CM | POA: Diagnosis not present

## 2022-12-15 DIAGNOSIS — Z79899 Other long term (current) drug therapy: Secondary | ICD-10-CM

## 2022-12-15 DIAGNOSIS — Z634 Disappearance and death of family member: Secondary | ICD-10-CM

## 2022-12-15 NOTE — Progress Notes (Signed)
[place] MD OP Progress Note  12/15/2022 1:21 PM BARB SHEAR  MRN:  [id number]  Chief Complaint:  Chief Complaint  Patient presents with   Follow-up   Depression   Anxiety   Medication Refill   HPI: Barbara [name] is a 57 year old Caucasian female, lives in [place], employed, divorced, has a history of bipolar disorder was evaluated in office today.  Patient today reports she is currently doing better with regards to her mood symptoms.  She had pneumonia and sinus infection recently and was placed on antibiotic therapy.  She reports she was finally able to get over it.  However she did struggle and had to take some time off from work at that time.  Patient reports mood wise she is doing okay.  Denies any mood swings, significant anxiety or sadness.  Reports sleep continues to be restless since she has an overactive bladder.  Although she is on medications to treat and she continues to struggle with it.  Patient is currently on trazodone which helps.  Denies side effects.  Patient agreeable to get labs done including Depakote level sodium, platelet, liver function.  Patient would like to get it done at  [facility].  Patient reports she had metabolic panel including lipid panel hemoglobin A1c completed few months ago.  She agrees to send the report to [place].  She denies any suicidality, homicidality or perceptual disturbances.  Patient reports she is coping with her grief better.  Patient denies any other concerns today.  Visit Diagnosis:    ICD-10-CM   1. Bipolar disorder, in full remission, most recent episode depressed (HCC)  F31.76 Valproic acid level    Sodium    Hepatic function panel    Platelet count    2. Insomnia due to medical condition  G47.01 Sodium   Anxiety    3. Bereavement  Z63.4     4. High risk medication use  Z79.899 Valproic acid level    Sodium    Hepatic function panel    Platelet count      Past Psychiatric History: Reviewed past psychiatric history  from progress note on 02/12/2018.  Past Medical History:  Past Medical History:  Diagnosis Date   Acid reflux    Adnexal mass    Bipolar disorder ([place])    Depression    Depression    Herpes    Irregular menstrual bleeding    Melanoma ([place])     Past Surgical History:  Procedure Laterality Date   CERVICAL BIOPSY     Dr. Kenton Kingfisher with [facility]   COLONOSCOPY     COLONOSCOPY     DILATION AND CURETTAGE OF UTERUS     MELANOMA EXCISION     TONSILLECTOMY      Family Psychiatric History: Reviewed family psychiatric history from progress note on 02/12/2018.  Family History:  Family History  Problem Relation Age of Onset   Heart attack Father    Cancer Father        melanoma   Cancer Maternal Aunt        breast   Breast cancer Maternal Aunt    Cancer Paternal Aunt        breast   Breast cancer Paternal Aunt    Breast cancer Cousin     Social History: Reviewed social history from progress note on 02/12/2018. Social History   Socioeconomic History   Marital status: Divorced    Spouse name: Not on file   Number of children: 1  Years of education: Not on file   Highest education level: Associate degree: occupational, technical, or vocational program  Occupational History    Comment: full time  Tobacco Use   Smoking status: Former    Types: Cigarettes    Quit date: 02/12/1990    Years since quitting: 32.8   Smokeless tobacco: Never  Vaping Use   Vaping Use: Never used  Substance and Sexual Activity   Alcohol use: No    Alcohol/week: 0.0 standard drinks of alcohol   Drug use: No   Sexual activity: Not Currently  Other Topics Concern   Not on file  Social History Narrative   Not on file   Social Determinants of Health   Financial Resource Strain: Low Risk  (07/28/2021)   Overall Financial Resource Strain (CARDIA)    Difficulty of Paying Living Expenses: Not hard at all  Food Insecurity: No Food Insecurity (07/28/2021)   Hunger Vital Sign    Worried About Running  Out of Food in the Last Year: Never true    [place] in the Last Year: Never true  Transportation Needs: No Transportation Needs (07/28/2021)   PRAPARE - Hydrologist (Medical): No    Lack of Transportation (Non-Medical): No  Physical Activity: Insufficiently Active (07/28/2021)   Exercise Vital Sign    Days of Exercise per Week: 1 day    Minutes of Exercise per Session: 20 min  Stress: Stress Concern Present (07/28/2021)   [place]    Feeling of Stress : To some extent  Social Connections: Moderately Isolated (07/28/2021)   Social Connection and Isolation Panel [NHANES]    Frequency of Communication with Friends and Family: More than three times a week    Frequency of Social Gatherings with Friends and Family: More than three times a week    Attends Religious Services: 1 to 4 times per year    Active Member of [employer] or Organizations: No    Attends Archivist Meetings: Never    Marital Status: Divorced    Allergies:  Allergies  Allergen Reactions   Penicillins Hives and Other (See Comments)    delusional   Sulfa Antibiotics Hives and Other (See Comments)    dellusional dellusional Other reaction(s): Other (See Comments) dellusional   Prednisone Other (See Comments)    Heart racing    Metabolic Disorder Labs: Lab Results  Component Value Date   HGBA1C 5.3 12/17/2020   Lab Results  Component Value Date   PROLACTIN 10.6 12/17/2020   PROLACTIN 8.2 08/25/2019   Lab Results  Component Value Date   CHOL 226 (H) 07/29/2021   TRIG 181 (H) 07/29/2021   HDL 50 07/29/2021   CHOLHDL 4.5 (H) 07/29/2021   LDLCALC 143 (H) 07/29/2021   LDLCALC 141 (H) 08/25/2019   Lab Results  Component Value Date   TSH 1.700 07/29/2021   TSH 3.530 12/17/2020    Therapeutic Level Labs: No results found for: "LITHIUM" Lab Results  Component Value Date   VALPROATE 40 (L)  03/22/2022   VALPROATE 17 (L) 12/17/2020   No results found for: "CBMZ"  Current Medications: Current Outpatient Medications  Medication Sig Dispense Refill   acetaminophen (TYLENOL) 650 MG CR tablet Take by mouth.     acitretin (SORIATANE) 10 MG capsule Take 10 mg by mouth daily before breakfast.     Ascorbic Acid (VITAMIN C) 1000 MG tablet Take 2,000 mg by mouth  daily.     azelastine (ASTELIN) 0.1 % nasal spray Place into both nostrils.     Black Elderberry 50 MG/5ML SYRP Take by mouth.     Cholecalciferol 25 MCG (1000 UT) tablet Take by mouth.     divalproex (DEPAKOTE ER) 500 MG 24 hr tablet Take 2 tablets (1,000 mg total) by mouth daily. 180 tablet 0   hydrOXYzine (ATARAX) 25 MG tablet TAKE 1/2 TO 1 TABLET BY  MOUTH 3 TIMES DAILY AS  NEEDED FOR ANXIETY/SLEEP 270 tablet 0   Lurasidone HCl 60 MG TABS TAKE 1 TABLET BY MOUTH DAILY  WITH SUPPER 90 tablet 0   meclizine (ANTIVERT) 12.5 MG tablet Take 12.5 mg by mouth 3 (three) times daily.     mirabegron ER (MYRBETRIQ) 25 MG TB24 tablet Take 1 tablet (25 mg total) by mouth daily. 90 tablet 4   neomycin-polymyxin b-dexamethasone (MAXITROL) 3.5-10000-0.1 SUSP   0   pimecrolimus (ELIDEL) 1 % cream Apply topically.     promethazine-dextromethorphan (PROMETHAZINE-DM) 6.25-15 MG/5ML syrup Take by mouth.     tolterodine (DETROL LA) 2 MG 24 hr capsule      traZODone (DESYREL) 100 MG tablet TAKE 1 TO 1 AND 1/2 TABLETS BY  MOUTH AT BEDTIME AS NEEDED FOR  SLEEP 90 tablet 3   triamcinolone cream (KENALOG) 0.1 % APPLY TO AFFECTED AREA TWICE A DAY     No current facility-administered medications for this visit.     Musculoskeletal: Strength & Muscle Tone: within normal limits Gait & Station: normal Patient leans: N/A  Psychiatric Specialty Exam: Review of Systems  Psychiatric/Behavioral:  Positive for sleep disturbance.   All other systems reviewed and are negative.   Blood pressure 127/71, pulse 69, temperature 98 F (36.7 C), temperature  source Oral, height '5\' 9"'$  (1.753 m), weight 203 lb 9.6 oz (92.4 kg), SpO2 96 %.Body mass index is 30.07 kg/m.  General Appearance: Casual  Eye Contact:  Good  Speech:  Clear and Coherent  Volume:  Normal  Mood:  Euthymic  Affect:  Congruent  Thought Process:  Goal Directed and Descriptions of Associations: Intact  Orientation:  Full (Time, Place, and Person)  Thought Content: Logical   Suicidal Thoughts:  No  Homicidal Thoughts:  No  Memory:  Immediate;   Fair Recent;   Fair Remote;   Fair  Judgement:  Fair  Insight:  Fair  Psychomotor Activity:  Normal  Concentration:  Concentration: Fair and Attention Span: Fair  Recall:  [employer] of Knowledge: Fair  Language: Fair  Akathisia:  No  Handed:  Right  AIMS (if indicated): done  Assets:  Communication Skills Desire for [place] Talents/Skills Transportation  ADL's:  Intact  Cognition: WNL  Sleep:   restless due to need to urinate   Screenings: Administrator, Civil Service Office Visit from 12/15/2022 in [facility] Office Visit from 06/16/2022 in [facility] Office Visit from 03/03/2022 in [place] Office Visit from 08/19/2021 in [place] Office Visit from 02/12/2018 in [place] Total Score 0 0 0 0 0      [place] Office Visit from 12/15/2022 in [place] Office Visit from 06/16/2022 in [facility] Office Visit from 08/19/2021 in [place] Office Visit from 07/28/2021 in [facility] [place]  [facility] Video Visit from 03/24/2021 in [place]  Total GAD-7 Score 0 0 3 0 2      PHQ2-9    Flowsheet Row Office  Visit from 12/15/2022 in [place] Office Visit from 07/27/2022 in [facility] Office Visit from 06/16/2022 in [place] Office Visit from 03/03/2022 in [place] Office Visit from 10/28/2021 in [place]  PHQ-2 Total Score 0 0 0 0 2  PHQ-9 Total Score -- 0 '1 1 3      '$ [place] Office Visit from 12/15/2022 in [facility] Office Visit from 06/16/2022 in [place] Office Visit from 03/03/2022 in [place] No Risk No Risk No Risk        Assessment and Plan: ELIRA COLASANTI is a 57 year old Caucasian female, employed, divorced, lives in [place], has a history of bipolar disorder was evaluated in office today.  Patient is currently stable.  Plan as noted below.  Plan Bipolar disorder in remission Depakote ER 1000 mg at bedtime Latuda 60 mg p.o. daily with meals Depakote-03/22/2022-40-subtherapeutic.   Insomnia-restless due to overactive bladder Patient advised to work on sleep hygiene techniques, restricting fluid as well as contact her provider regarding treatment for overactive bladder.  Currently on Myrbetriq. Continue trazodone 100-150 mg p.o. nightly as needed Hydroxyzine 12.5-25 mg at bedtime as needed for sleep  High risk medication use-will order labs including Depakote level, sodium, platelet count, hepatic function.  Patient provided hard copy of labs slip per her request.  [facility] to [facility] to [facility]  Also will require the following labs including lipid panel, hemoglobin A1c, prolactin-patient reports she recently had these labs done and will send report to [place].  Patient advised to also consider repeating TSH.  Bereavement-improving Will  monitor closely  Follow-up in clinic in 5 months or sooner if needed.   This note was generated in part or whole with voice recognition software. Voice recognition is usually quite accurate but there are transcription errors that can and very often do occur. I apologize for any typographical errors that were not detected and corrected.     Ursula Alert, MD 12/15/2022, 1:21 PM

## 2023-01-19 ENCOUNTER — Ambulatory Visit: Payer: 59 | Admitting: Psychiatry

## 2023-02-12 ENCOUNTER — Encounter: Payer: Self-pay | Admitting: Physician Assistant

## 2023-02-12 ENCOUNTER — Ambulatory Visit
Admission: RE | Admit: 2023-02-12 | Discharge: 2023-02-12 | Disposition: A | Discharge status: Home / Self Care | Payer: 59 | Attending: Physician Assistant | Admitting: Physician Assistant

## 2023-02-12 ENCOUNTER — Ambulatory Visit: Payer: 59 | Admitting: Physician Assistant

## 2023-02-12 ENCOUNTER — Ambulatory Visit
Admission: RE | Admit: 2023-02-12 | Discharge: 2023-02-12 | Disposition: A | Discharge status: Home / Self Care | Payer: 59 | Source: Ambulatory Visit | Attending: Physician Assistant | Admitting: Physician Assistant

## 2023-02-12 ENCOUNTER — Other Ambulatory Visit: Payer: Self-pay | Admitting: Physician Assistant

## 2023-02-12 VITALS — BP 118/68 | HR 87 | Temp 98.2°F | Resp 16 | Ht 69.0 in | Wt 206.0 lb

## 2023-02-12 DIAGNOSIS — R0989 Other specified symptoms and signs involving the circulatory and respiratory systems: Secondary | ICD-10-CM

## 2023-02-12 DIAGNOSIS — J069 Acute upper respiratory infection, unspecified: Secondary | ICD-10-CM

## 2023-02-12 MED ORDER — AZELASTINE HCL 0.1 % NA SOLN: 2.0000 | Freq: Two times a day (BID) | NASAL | 1 refills | Status: DC

## 2023-02-12 MED ORDER — METHYLPREDNISOLONE 4 MG PO TBPK
ORAL_TABLET | ORAL | 0 refills | Status: DC
Start: 2023-02-12 — End: 2023-05-31

## 2023-02-12 MED ORDER — AZITHROMYCIN 250 MG PO TABS
ORAL_TABLET | ORAL | 0 refills | Status: DC
Start: 2023-02-12 — End: 2023-05-31

## 2023-02-12 NOTE — Progress Notes (Unsigned)
Acute Office Visit   Patient: Barbara [name]   DOB: 1966-01-08   57 y.o. Female  MRN: [id number] Visit Date: 02/12/2023  Today's healthcare provider: Dani Gobble Asaiah Hunnicutt, PA-C  Introduced myself to the patient as a Journalist, newspaper and provided education on APPs in clinical practice.    Chief Complaint  Patient presents with   Cough   Nasal Congestion   Sore Throat   Headache    Facial pressure, sx since Sat   Subjective    HPI HPI     Headache    Additional comments: Facial pressure, sx since Sat      Last edited by Salomon Fick, CMA on 02/12/2023  2:44 PM.       Upper respiratory symptoms  Onset: sudden   Duration: ongoing since Sat   Associated symptoms: Sinus congestion, ear fullness, sore throat, headaches, sinus pressure and pain   Interventions: Mucinex sinus,  She takes an OTC antihistamine   Recent sick contacts: she reports her grandchildren had a cold while she was babysitting them   COVID testing: has not tested at home      Medications: Outpatient Medications Prior to Visit  Medication Sig   acetaminophen (TYLENOL) 650 MG CR tablet Take by mouth.   acitretin (SORIATANE) 10 MG capsule Take 10 mg by mouth daily before breakfast.   Ascorbic Acid (VITAMIN C) 1000 MG tablet Take 2,000 mg by mouth daily.   Black Elderberry 50 MG/5ML SYRP Take by mouth.   Cholecalciferol 25 MCG (1000 UT) tablet Take by mouth.   divalproex (DEPAKOTE ER) 500 MG 24 hr tablet Take 2 tablets (1,000 mg total) by mouth daily.   hydrOXYzine (ATARAX) 25 MG tablet TAKE 1/2 TO 1 TABLET BY  MOUTH 3 TIMES DAILY AS  NEEDED FOR ANXIETY/SLEEP   Lurasidone HCl 60 MG TABS TAKE 1 TABLET BY MOUTH DAILY  WITH SUPPER   meclizine (ANTIVERT) 12.5 MG tablet Take 12.5 mg by mouth 3 (three) times daily.   mirabegron ER (MYRBETRIQ) 25 MG TB24 tablet Take 1 tablet (25 mg total) by mouth daily.   neomycin-polymyxin b-dexamethasone (MAXITROL) 3.5-10000-0.1 SUSP    pimecrolimus (ELIDEL) 1 %  cream Apply topically.   promethazine-dextromethorphan (PROMETHAZINE-DM) 6.25-15 MG/5ML syrup Take by mouth.   tolterodine (DETROL LA) 2 MG 24 hr capsule    traZODone (DESYREL) 100 MG tablet TAKE 1 TO 1 AND 1/2 TABLETS BY  MOUTH AT BEDTIME AS NEEDED FOR  SLEEP   triamcinolone cream (KENALOG) 0.1 % APPLY TO AFFECTED AREA TWICE A DAY   [DISCONTINUED] azelastine (ASTELIN) 0.1 % nasal spray Place into both nostrils.   No facility-administered medications prior to visit.    Review of Systems  Constitutional:  Positive for fatigue. Negative for chills and fever.  HENT:  Positive for congestion, postnasal drip, sinus pressure and sore throat. Negative for ear pain.   Eyes:  Positive for discharge and itching.  Respiratory:  Positive for cough. Negative for shortness of breath and wheezing.   Gastrointestinal:  Negative for diarrhea, nausea and vomiting.  Musculoskeletal:  Negative for myalgias.  Neurological:  Positive for headaches.    {Labs  Heme  Chem  Endocrine  Serology  Results Review (optional):23779}   Objective    BP 118/68   Pulse 87   Temp 98.2 F (36.8 C) (Oral)   Resp 16   Ht 5\' 9"  (1.753 m)   Wt 206 lb (93.4 kg)   SpO2 93%  BMI 30.42 kg/m  {Show previous vital signs (optional):23777}  Physical Exam Vitals reviewed.  Constitutional:      General: She is awake.     Appearance: Normal appearance. She is well-developed and well-groomed.  HENT:     Head: Normocephalic and atraumatic.     Right Ear: Hearing and ear canal normal. Tympanic membrane is erythematous. Tympanic membrane is not injected, scarred, perforated, retracted or bulging.     Left Ear: Hearing, tympanic membrane and ear canal normal. Tympanic membrane is not injected, scarred, perforated, erythematous, retracted or bulging.     Mouth/Throat:     Lips: Pink.     Mouth: Mucous membranes are moist.     Pharynx: Oropharynx is clear. Uvula midline. No oropharyngeal exudate or posterior oropharyngeal  erythema.  Eyes:     General: Lids are normal. Gaze aligned appropriately.     Extraocular Movements: Extraocular movements intact.     Conjunctiva/sclera: Conjunctivae normal.     Pupils: Pupils are equal, round, and reactive to light.  Cardiovascular:     Rate and Rhythm: Normal rate and regular rhythm.     Heart sounds: Normal heart sounds. No murmur heard.    No friction rub. No gallop.  Pulmonary:     Effort: Pulmonary effort is normal.     Breath sounds: Decreased air movement present. Examination of the right-upper field reveals decreased breath sounds. Examination of the left-upper field reveals decreased breath sounds. Examination of the right-middle field reveals decreased breath sounds. Examination of the left-middle field reveals decreased breath sounds. Examination of the right-lower field reveals decreased breath sounds. Examination of the left-lower field reveals decreased breath sounds. Decreased breath sounds present. No wheezing, rhonchi or rales.  Lymphadenopathy:     Head:     Right side of head: No submental, submandibular or preauricular adenopathy.     Left side of head: No submental, submandibular or preauricular adenopathy.     Cervical:     Right cervical: No superficial or posterior cervical adenopathy.    Left cervical: No superficial or posterior cervical adenopathy.     Upper Body:     Right upper body: No supraclavicular adenopathy.     Left upper body: No supraclavicular adenopathy.  Skin:    General: Skin is warm.  Neurological:     General: No focal deficit present.     Mental Status: She is alert and oriented to person, place, and time. Mental status is at baseline.  Psychiatric:        Mood and Affect: Mood normal.        Behavior: Behavior normal. Behavior is cooperative.        Thought Content: Thought content normal.        Judgment: Judgment normal.       No results found for any visits on 02/12/23.  Assessment & Plan      No  follow-ups on file.      Problem List Items Addressed This Visit   None Visit Diagnoses     Upper respiratory tract infection, unspecified type    -  Primary   Relevant Medications   azelastine (ASTELIN) 0.1 % nasal spray   azithromycin (ZITHROMAX) 250 MG tablet   Decreased breath sounds of both lungs       Relevant Medications   methylPREDNISolone (MEDROL DOSEPAK) 4 MG TBPK tablet   azithromycin (ZITHROMAX) 250 MG tablet   Other Relevant Orders   DG Chest 2 View (Completed)  No follow-ups on file.   I, Asami Lambright E Rakeya Glab, PA-C, have reviewed all documentation for this visit. The documentation on 02/12/23 for the exam, diagnosis, procedures, and orders are all accurate and complete.   Talitha Givens, MHS, PA-C [facility]

## 2023-02-12 NOTE — Progress Notes (Signed)
Your xray did not show signs of pneumonia but there was some mild collapse in the bottom lobes. I have sent in a Zpack to further help with your cough and symptoms. Please take this along with the Medrol, nasal spray and an antihistamine to help with your symptoms. Let us know if you have further questions or concerns.

## 2023-02-13 NOTE — Telephone Encounter (Signed)
Unable to refill per protocol, Rx request is too soon. Last refill 02/12/23.  Requested Prescriptions  Pending Prescriptions Disp Refills   azelastine (ASTELIN) 0.1 % nasal spray [Pharmacy Med Name: AZELASTINE 0.1%(137MCG) NASAL-200SP] 90 mL     Sig: USE 2 SPRAYS IN EACH NOSTRIL TWICE DAILY     Ear, Nose, and Throat: Nasal Preparations - Antiallergy Passed - 02/12/2023  3:33 PM      Passed - Valid encounter within last 12 months    Recent Outpatient Visits           Yesterday Upper respiratory tract infection, unspecified type   [place], PA-C   6 months ago Viral upper respiratory tract infection   [place], Julie F, FNP   1 year ago Need for influenza vaccination   [facility] Kathrine Haddock, NP   2 years ago Overweight (BMI 25.0-29.9)   [facility] Lebron Conners D, MD   3 years ago Acute bronchitis, unspecified organism   [facility], Astrid Divine, Tensed

## 2023-02-21 ENCOUNTER — Other Ambulatory Visit: Payer: Self-pay | Admitting: Psychiatry

## 2023-02-21 DIAGNOSIS — F3176 Bipolar disorder, in full remission, most recent episode depressed: Secondary | ICD-10-CM

## 2023-03-21 ENCOUNTER — Encounter: Payer: Self-pay | Admitting: Obstetrics & Gynecology

## 2023-03-21 ENCOUNTER — Other Ambulatory Visit: Payer: Self-pay | Admitting: Physician Assistant

## 2023-03-21 DIAGNOSIS — Z1231 Encounter for screening mammogram for malignant neoplasm of breast: Secondary | ICD-10-CM

## 2023-04-07 LAB — HEPATIC FUNCTION PANEL
ALT: 12 IU/L (ref 0–32)
AST: 16 IU/L (ref 0–40)
Albumin: 4.2 g/dL (ref 3.8–4.9)
Alkaline Phosphatase: 49 IU/L (ref 44–121)
Bilirubin Total: 0.3 mg/dL (ref 0.0–1.2)
Bilirubin, Direct: <0.1 mg/dL (ref 0.00–0.40)
Total Protein: 6.6 g/dL (ref 6.0–8.5)

## 2023-04-07 LAB — SODIUM: Sodium: 141 mmol/L (ref 134–144)

## 2023-04-07 LAB — VALPROIC ACID LEVEL: Valproic Acid Lvl: 40 ug/mL — ABNORMAL LOW (ref 50–100)

## 2023-04-07 LAB — PLATELET COUNT: Platelets: 202 10*3/uL (ref 150–450)

## 2023-04-27 ENCOUNTER — Ambulatory Visit
Admission: RE | Admit: 2023-04-27 | Discharge: 2023-04-27 | Disposition: A | Discharge status: Home / Self Care | Payer: 59 | Source: Ambulatory Visit | Attending: Physician Assistant | Admitting: Physician Assistant

## 2023-04-27 DIAGNOSIS — Z1231 Encounter for screening mammogram for malignant neoplasm of breast: Secondary | ICD-10-CM | POA: Insufficient documentation

## 2023-05-01 ENCOUNTER — Telehealth: Payer: Self-pay

## 2023-05-01 NOTE — Progress Notes (Signed)
Mammogram did not find evidence of malignancy Recommended repeat screening mammogram in one year.

## 2023-05-01 NOTE — Telephone Encounter (Signed)
Pt called for mammogram results. Share provider's note. No questions.  Barbara Conroy Mecum, PA-C 05/01/2023  1:39 PM EDT     Mammogram did not find evidence of malignancy Recommended repeat screening mammogram in one year.

## 2023-05-25 ENCOUNTER — Ambulatory Visit: Payer: 59 | Admitting: Psychiatry

## 2023-05-31 ENCOUNTER — Ambulatory Visit (INDEPENDENT_AMBULATORY_CARE_PROVIDER_SITE_OTHER): Payer: 59 | Admitting: Obstetrics and Gynecology

## 2023-05-31 ENCOUNTER — Encounter: Payer: Self-pay | Admitting: Obstetrics and Gynecology

## 2023-05-31 VITALS — BP 110/70 | Ht 69.0 in | Wt 210.0 lb

## 2023-05-31 DIAGNOSIS — Z1231 Encounter for screening mammogram for malignant neoplasm of breast: Secondary | ICD-10-CM

## 2023-05-31 DIAGNOSIS — Z01419 Encounter for gynecological examination (general) (routine) without abnormal findings: Secondary | ICD-10-CM | POA: Diagnosis not present

## 2023-05-31 DIAGNOSIS — N3281 Overactive bladder: Secondary | ICD-10-CM

## 2023-05-31 DIAGNOSIS — Z803 Family history of malignant neoplasm of breast: Secondary | ICD-10-CM

## 2023-05-31 MED ORDER — TOLTERODINE TARTRATE ER 4 MG PO CP24
4.0000 mg | ORAL_CAPSULE | Freq: Every day | ORAL | 2 refills | Status: DC
Start: 2023-05-31 — End: 2024-07-08

## 2023-05-31 NOTE — Progress Notes (Addendum)
PCP: Danelle Berry, PA-C   Chief Complaint  Patient presents with   Gynecologic Exam    No concerns    HPI:      [name] is a 57 y.o. G1P1001 whose LMP was No LMP recorded. Patient is postmenopausal., presents today for her annual examination.  Her menses are absent due to menopause. No PMB.  She does not have vasomotor sx.   Sex activity: not sexually active. She does not have vaginal dryness/issues.  Last Pap: 02/15/22  Results were: no abnormalities /neg HPV DNA. No hx of abn paps.   Last mammogram: 04/27/23  Results were: normal--routine follow-up in 12 months There is a FH of breast cancer in her mat aunt, pat aunt and cousin, genetic testing not done/pt doesn't meet criteria. Her dad passed away from melanoma. There is no FH of ovarian cancer. The patient does not do self-breast exams.  Colonoscopy: ~2019 at [facility];  Repeat due after 10 years per pt.   Tobacco use: The patient denies current or previous tobacco use. Alcohol use: none No drug use Exercise: not active  She does get adequate calcium and Vitamin D in her diet.  Labs with PCP/work.  Complains of urinary frequency with decreased flow/nocturia. Sometimes has to go hourly. Drinks 1 caffeine soda daily. Treated with myrbetriq for OAB with Dr. Tiburcio Pea without sx control. Was also expensive.   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 Tiburcio Pea with [facility]   COLONOSCOPY     COLONOSCOPY     DILATION AND CURETTAGE OF UTERUS     MELANOMA EXCISION     TONSILLECTOMY      Family History  Problem Relation Age of Onset   Heart attack Father    Cancer Father        melanoma   Breast cancer Maternal Aunt        4s   Breast cancer Paternal Aunt        50s/60s   Breast cancer Cousin        7s    Social History   Socioeconomic History   Marital status: Divorced    Spouse name: Not on file   Number of children: 1   Years of education: Not on file   Highest education level: Associate degree: occupational, Scientist, product/process development, or vocational program  Occupational History    Comment: full time  Tobacco Use   Smoking status: Former    Current packs/day: 0.00    Types: Cigarettes    Quit date: 02/12/1990    Years since quitting: 33.3   Smokeless tobacco: Never  Vaping Use   Vaping status: Never Used  Substance and  Sexual Activity   Alcohol use: No    Alcohol/week: 0.0 standard drinks of alcohol   Drug use: No   Sexual activity: Not Currently    Birth control/protection: Post-menopausal  Other Topics Concern   Not on file  Social History Narrative   Not on file   Social Determinants of Health   Financial Resource Strain: Low Risk  (07/28/2021)   Overall Financial Resource Strain (CARDIA)    Difficulty of Paying Living Expenses: Not hard at all  Food Insecurity: No Food Insecurity (07/28/2021)   Hunger Vital Sign    Worried About Running Out of Food in the Last Year: Never true    Ran Out of Food in the Last Year: Never true  Transportation Needs: No Transportation Needs (07/28/2021)   PRAPARE - Administrator, Civil Service (Medical): No     Lack of Transportation (Non-Medical): No  Physical Activity: Insufficiently Active (07/28/2021)   Exercise Vital Sign    Days of Exercise per Week: 1 day    Minutes of Exercise per Session: 20 min  Stress: Stress Concern Present (07/28/2021)   [employer] of Occupational Health - Occupational Stress Questionnaire    Feeling of Stress : To some extent  Social Connections: Moderately Isolated (07/28/2021)   Social Connection and Isolation Panel [NHANES]    Frequency of Communication with Friends and Family: More than three times a week    Frequency of Social Gatherings with Friends and Family: More than three times a week    Attends Religious Services: 1 to 4 times per year    Active Member of [employer] or Organizations: No    Attends Banker Meetings: Never    Marital Status: Divorced  Catering manager Violence: Not At Risk (07/28/2021)   Humiliation, Afraid, Rape, and Kick questionnaire    Fear of Current or Ex-Partner: No    Emotionally Abused: No    Physically Abused: No    Sexually Abused: No     Current Outpatient Medications:    acetaminophen (TYLENOL) 650 MG CR tablet, Take by mouth., Disp: , Rfl:    Ascorbic Acid (VITAMIN C) 1000 MG tablet, Take 2,000 mg by mouth daily., Disp: , Rfl:    azelastine (ASTELIN) 0.1 % nasal spray, Place 2 sprays into both nostrils 2 (two) times daily., Disp: 30 mL, Rfl: 1   Cholecalciferol 25 MCG (1000 UT) tablet, Take by mouth., Disp: , Rfl:    divalproex (DEPAKOTE ER) 500 MG 24 hr tablet, TAKE 2 TABLETS BY MOUTH DAILY, Disp: 180 tablet, Rfl: 3   Lurasidone HCl 60 MG TABS, TAKE 1 TABLET BY MOUTH DAILY  WITH SUPPER, Disp: 90 tablet, Rfl: 3   neomycin-polymyxin b-dexamethasone (MAXITROL) 3.5-10000-0.1 SUSP, , Disp: , Rfl: 0   pimecrolimus (ELIDEL) 1 % cream, Apply topically., Disp: , Rfl:    tolterodine (DETROL LA) 4 MG 24 hr capsule, Take 1 capsule (4 mg total) by mouth daily., Disp: 30 capsule, Rfl: 2   traZODone (DESYREL) 100 MG  tablet, TAKE 1 TO 1 AND 1/2 TABLETS BY  MOUTH AT BEDTIME AS NEEDED FOR  SLEEP, Disp: 90 tablet, Rfl: 3   triamcinolone cream (KENALOG) 0.1 %, APPLY TO AFFECTED AREA TWICE A DAY, Disp: , Rfl:      ROS:  Review of Systems  Constitutional:  Negative for fatigue, fever and unexpected weight change.  Respiratory:  Negative for cough, shortness of breath and wheezing.   Cardiovascular:  Negative for chest pain, palpitations and leg  swelling.  Gastrointestinal:  Negative for blood in stool, constipation, diarrhea, nausea and vomiting.  Endocrine: Negative for cold intolerance, heat intolerance and polyuria.  Genitourinary:  Positive for frequency. Negative for dyspareunia, dysuria, flank pain, genital sores, hematuria, menstrual problem, pelvic pain, urgency, vaginal bleeding, vaginal discharge and vaginal pain.  Musculoskeletal:  Negative for back pain, joint swelling and myalgias.  Skin:  Negative for rash.  Neurological:  Negative for dizziness, syncope, light-headedness, numbness and headaches.  Hematological:  Negative for adenopathy.  Psychiatric/Behavioral:  Negative for agitation, confusion, sleep disturbance and suicidal ideas. The patient is not nervous/anxious.    BREAST: No symptoms    Objective: BP 110/70   Ht 5\' 9"  (1.753 m)   Wt 210 lb (95.3 kg)   BMI 31.01 kg/m    Physical Exam Constitutional:      Appearance: She is well-developed.  Genitourinary:     Vulva normal.     Right Labia: No rash, tenderness or lesions.    Left Labia: No tenderness, lesions or rash.    No vaginal discharge, erythema or tenderness.      Right Adnexa: not tender and no mass present.    Left Adnexa: not tender and no mass present.    No cervical friability or polyp.     Uterus is not enlarged or tender.  Breasts:    Right: No mass, nipple discharge, skin change or tenderness.     Left: No mass, nipple discharge, skin change or tenderness.  Neck:     Thyroid: No thyromegaly.   Cardiovascular:     Rate and Rhythm: Normal rate and regular rhythm.     Heart sounds: Normal heart sounds. No murmur heard. Pulmonary:     Effort: Pulmonary effort is normal.     Breath sounds: Normal breath sounds.  Abdominal:     Palpations: Abdomen is soft.     Tenderness: There is no abdominal tenderness. There is no guarding or rebound.  Musculoskeletal:        General: Normal range of motion.     Cervical back: Normal range of motion.  Lymphadenopathy:     Cervical: No cervical adenopathy.  Neurological:     General: No focal deficit present.     Mental Status: She is alert and oriented to person, place, and time.     Cranial Nerves: No cranial nerve deficit.  Skin:    General: Skin is warm and dry.  Psychiatric:        Mood and Affect: Mood normal.        Behavior: Behavior normal.        Thought Content: Thought content normal.        Judgment: Judgment normal.  Vitals reviewed.    Assessment/Plan:  Encounter for annual routine gynecological examination  Encounter for screening mammogram for malignant neoplasm of breast; pt current on mammo  Family history of breast cancer--pat side is suspicious but pt doesn't meet testing guidelines. Recommend affected family members get tested. Can then test pt if positive FH.   OAB (overactive bladder) - Plan: tolterodine (DETROL LA) 4 MG 24 hr capsule; try Rx detrol for cost/sx. F/u in 3 months re: sx; d/c caffeine.    Meds ordered this encounter  Medications   tolterodine (DETROL LA) 4 MG 24 hr capsule    Sig: Take 1 capsule (4 mg total) by mouth daily.    Dispense:  30 capsule    Refill:  2    Order Specific Question:  Supervising Provider    Answer:   Waymon Budge           GYN counsel breast self exam, mammography screening, menopause, adequate intake of calcium and vitamin D, diet and exercise    F/U  Return in about 1 year (around 05/30/2024).  Antionne Enrique B. Mianna Iezzi, PA-C 05/31/2023 5:00 PM

## 2023-05-31 NOTE — Patient Instructions (Signed)
I value your feedback and you entrusting us with your care. If you get a [facility] patient survey, I would appreciate you taking the time to let us know about your experience today. Thank you! ? ? ?

## 2023-07-07 LAB — LAB REPORT - SCANNED: A1c: 5.6

## 2023-07-13 ENCOUNTER — Ambulatory Visit: Payer: 59 | Admitting: Psychiatry

## 2023-08-01 ENCOUNTER — Encounter: Payer: Self-pay | Admitting: Psychiatry

## 2023-08-01 ENCOUNTER — Ambulatory Visit: Payer: 59 | Admitting: Psychiatry

## 2023-08-01 VITALS — BP 115/77 | HR 80 | Temp 97.8°F | Ht 69.0 in | Wt 211.0 lb

## 2023-08-01 DIAGNOSIS — G4701 Insomnia due to medical condition: Secondary | ICD-10-CM | POA: Diagnosis not present

## 2023-08-01 DIAGNOSIS — Z79899 Other long term (current) drug therapy: Secondary | ICD-10-CM

## 2023-08-01 DIAGNOSIS — F3176 Bipolar disorder, in full remission, most recent episode depressed: Secondary | ICD-10-CM | POA: Diagnosis not present

## 2023-08-01 NOTE — Progress Notes (Signed)
BH MD OP Progress Note  08/01/2023 5:00 PM Barbara [name]  MRN:  [id number]  Chief Complaint:  Chief Complaint  Patient presents with   Follow-up   Depression   Anxiety   Medication Refill   HPI: Barbara [name] is a 57 year old Caucasian female, lives in [place], employed, divorced, has a history of bipolar disorder was evaluated in office today.  Patient today reports she is currently doing well on the current combination of Depakote and Latuda.  Denies any significant mania, hypomanic symptoms.  Denies any depression symptoms.  Reports anxiety is manageable.  Patient reports sleep is good.  She has been working overtime and reports she feels tired at the end of the day and that has been helpful with sleep.  Patient appeared to be alert, oriented to person place time situation.  3 word memory immediate 3 out of 3, after 5 minutes 3 out of 3.  Patient was able to spell the word world forward and backward.  Attention and focus seem to be good.  Patient was able to get part of the labs ordered, pending labs like prolactin level, TSH, agrees to get it done at primary care provider's office.  Reviewed and discussed lab results with patient.  Patient denies any other concerns today.  Visit Diagnosis:    ICD-10-CM   1. Bipolar disorder, in full remission, most recent episode depressed (HCC)  F31.76     2. Insomnia due to medical condition  G47.01    Anxiety    3. High risk medication use  Z79.899 Prolactin    TSH      Past Psychiatric History: I have reviewed past psychiatric history from progress note on 02/12/2018.  Past Medical History:  Past Medical History:  Diagnosis Date   Acid reflux    Adnexal mass    Bipolar disorder (HCC)    Depression    Depression    Herpes    Irregular menstrual bleeding    Melanoma (HCC)     Past Surgical History:  Procedure Laterality Date   CERVICAL BIOPSY     Dr. Tiburcio Pea with [facility]   COLONOSCOPY     COLONOSCOPY      DILATION AND CURETTAGE OF UTERUS     MELANOMA EXCISION     TONSILLECTOMY      Family Psychiatric History: I have reviewed family psychiatric history from progress note on 02/12/2018.  Family History:  Family History  Problem Relation Age of Onset   Heart attack Father    Cancer Father        melanoma   Breast cancer Maternal Aunt        23s   Breast cancer Paternal Aunt        50s/60s   Breast cancer Cousin        16s    Social History: I have reviewed social history from progress note on 02/12/2018. Social History   Socioeconomic History   Marital status: Divorced    Spouse name: Not on file   Number of children: 1   Years of education: Not on file   Highest education level: Associate degree: occupational, Scientist, product/process development, or vocational program  Occupational History    Comment: full time  Tobacco Use   Smoking status: Former    Current packs/day: 0.00    Types: Cigarettes    Quit date: 02/12/1990    Years since quitting: 33.4   Smokeless tobacco: Never  Vaping Use   Vaping status: Never Used  Substance and Sexual Activity   Alcohol use: No    Alcohol/week: 0.0 standard drinks of alcohol   Drug use: No   Sexual activity: Not Currently    Birth control/protection: Post-menopausal  Other Topics Concern   Not on file  Social History Narrative   Not on file   Social Determinants of Health   Financial Resource Strain: Low Risk  (07/28/2021)   Overall Financial Resource Strain (CARDIA)    Difficulty of Paying Living Expenses: Not hard at all  Food Insecurity: No Food Insecurity (07/28/2021)   Hunger Vital Sign    Worried About Running Out of Food in the Last Year: Never true    Ran Out of Food in the Last Year: Never true  Transportation Needs: No Transportation Needs (07/28/2021)   PRAPARE - Administrator, Civil Service (Medical): No    Lack of Transportation (Non-Medical): No  Physical Activity: Insufficiently Active (07/28/2021)   Exercise Vital Sign     Days of Exercise per Week: 1 day    Minutes of Exercise per Session: 20 min  Stress: Stress Concern Present (07/28/2021)   [employer] of Occupational Health - Occupational Stress Questionnaire    Feeling of Stress : To some extent  Social Connections: Moderately Isolated (07/28/2021)   Social Connection and Isolation Panel [NHANES]    Frequency of Communication with Friends and Family: More than three times a week    Frequency of Social Gatherings with Friends and Family: More than three times a week    Attends Religious Services: 1 to 4 times per year    Active Member of [employer] or Organizations: No    Attends Banker Meetings: Never    Marital Status: Divorced    Allergies:  Allergies  Allergen Reactions   Penicillins Hives and Other (See Comments)    delusional   Sulfa Antibiotics Hives and Other (See Comments)    dellusional dellusional Other reaction(s): Other (See Comments) dellusional   Prednisone Other (See Comments)    Heart racing    Metabolic Disorder Labs: Lab Results  Component Value Date   HGBA1C 5.3 12/17/2020   Lab Results  Component Value Date   PROLACTIN 10.6 12/17/2020   PROLACTIN 8.2 08/25/2019   Lab Results  Component Value Date   CHOL 226 (H) 07/29/2021   TRIG 181 (H) 07/29/2021   HDL 50 07/29/2021   CHOLHDL 4.5 (H) 07/29/2021   LDLCALC 143 (H) 07/29/2021   LDLCALC 141 (H) 08/25/2019   Lab Results  Component Value Date   TSH 1.700 07/29/2021   TSH 3.530 12/17/2020    Therapeutic Level Labs: No results found for: "LITHIUM" Lab Results  Component Value Date   VALPROATE 40 (L) 04/06/2023   VALPROATE 40 (L) 03/22/2022   No results found for: "CBMZ"  Current Medications: Current Outpatient Medications  Medication Sig Dispense Refill   acetaminophen (TYLENOL) 650 MG CR tablet Take by mouth.     acitretin (SORIATANE) 10 MG capsule Take 10 mg by mouth daily.     Ascorbic Acid (VITAMIN C) 1000 MG tablet Take 2,000 mg by  mouth daily.     azelastine (ASTELIN) 0.1 % nasal spray Place 2 sprays into both nostrils 2 (two) times daily. 30 mL 1   Cholecalciferol 25 MCG (1000 UT) tablet Take by mouth.     divalproex (DEPAKOTE ER) 500 MG 24 hr tablet TAKE 2 TABLETS BY MOUTH DAILY 180 tablet 3   Lurasidone HCl 60 MG TABS TAKE  1 TABLET BY MOUTH DAILY  WITH SUPPER 90 tablet 3   minoxidil (LONITEN) 2.5 MG tablet Take 1.25 mg by mouth daily.     neomycin-polymyxin b-dexamethasone (MAXITROL) 3.5-10000-0.1 SUSP   0   pimecrolimus (ELIDEL) 1 % cream Apply topically.     tolterodine (DETROL LA) 4 MG 24 hr capsule Take 1 capsule (4 mg total) by mouth daily. 30 capsule 2   traZODone (DESYREL) 100 MG tablet TAKE 1 TO 1 AND 1/2 TABLETS BY  MOUTH AT BEDTIME AS NEEDED FOR  SLEEP 90 tablet 3   triamcinolone cream (KENALOG) 0.1 % APPLY TO AFFECTED AREA TWICE A DAY     No current facility-administered medications for this visit.     Musculoskeletal: Strength & Muscle Tone: within normal limits Gait & Station: normal Patient leans: N/A  Psychiatric Specialty Exam: Review of Systems  Psychiatric/Behavioral: Negative.      Blood pressure 115/77, pulse 80, temperature 97.8 F (36.6 C), temperature source Skin, height 5\' 9"  (1.753 m), weight 211 lb (95.7 kg).Body mass index is 31.16 kg/m.  General Appearance: Fairly Groomed  Eye Contact:  Fair  Speech:  Clear and Coherent  Volume:  Normal  Mood:  Euthymic  Affect:  Congruent  Thought Process:  Goal Directed and Descriptions of Associations: Intact  Orientation:  Full (Time, Place, and Person)  Thought Content: Logical   Suicidal Thoughts:  No  Homicidal Thoughts:  No  Memory:  Immediate;   Fair Recent;   Fair Remote;   Fair  Judgement:  Fair  Insight:  Fair  Psychomotor Activity:  Normal  Concentration:  Concentration: Fair and Attention Span: Fair  Recall:  [employer] of Knowledge: Fair  Language: Fair  Akathisia:  No  Handed:  Right  AIMS (if indicated): done   Assets:  Communication Skills Desire for Improvement Housing Social Support  ADL's:  Intact  Cognition: WNL  Sleep:  Fair   Screenings: Midwife Visit from 08/01/2023 in [facility] Office Visit from 12/15/2022 in [facility] Office Visit from 06/16/2022 in [facility] Office Visit from 03/03/2022 in [facility] Office Visit from 08/19/2021 in [facility]  AIMS Total Score 0 0 0 0 0      GAD-7    Flowsheet Row Office Visit from 08/01/2023 in [facility] Office Visit from 12/15/2022 in [facility] Office Visit from 06/16/2022 in [facility] Office Visit from 08/19/2021 in [facility] Office Visit from 07/28/2021 in [facility]  Total GAD-7 Score 0 0 0 3 0      PHQ2-9    Flowsheet Row Office Visit from 08/01/2023 in [facility] Office Visit from 02/12/2023 in [facility] Visit from 12/15/2022 in [facility] Office Visit from 07/27/2022 in [facility] Visit from 06/16/2022 in [facility]  PHQ-2 Total Score 0 0 0 0 0  PHQ-9 Total Score -- 0 -- 0 1      Flowsheet Row Office Visit from 08/01/2023 in [facility] Office Visit from 12/15/2022 in [facility] Office Visit from 06/16/2022 in [facility]  C-SSRS RISK CATEGORY No Risk No Risk No Risk        Assessment and Plan: Barbara [name] is a  57 year old Caucasian female, employed, divorced, lives in [place], has a history of bipolar disorder was evaluated in office today.  Patient is currently stable.  Plan as noted below.  Plan Bipolar disorder in remission Depakote ER 1000 mg at bedtime Latuda 60 mg p.o. daily with meals Depakote level-reviewed and discussed with patient-40-subtherapeutic however patient is currently stable on the current dosage and will continue the same. Discussed tapering down Latuda since she has been stable however patient declines.  Patient aware of long-term risk of being on atypical antipsychotics.  Insomnia-stable Will monitor closely.  High risk medication use-reviewed and discussed platelet count-dated 04/06/2023-202-within normal limits Hepatic function panel-within normal limits Sodium-141-within normal limits Hemoglobin A1c-5.6 Lipid panel-LDL high at 133, cholesterol high at 201, Otherwise within normal limits.  Patient provided lab slip to get TSH, prolactin level done.  Prolactin needs to be done since she is on medications like Latuda.  The last time she had thyroid labs done was 2 years ago.  Will recommend the same given her history of mood symptoms/history of sleep problems.  Discussed lifestyle modification, exercise, diet management. Consent: Patient/Guardian gives verbal consent for treatment and assignment of benefits for services provided during this visit. Patient/Guardian expressed understanding and agreed to proceed.   Follow-up in clinic in 6 months or sooner if needed.  This note was generated in part or whole with voice recognition software. Voice recognition is usually quite accurate but there are transcription errors that can and very often do occur. I apologize for any typographical errors that were not detected and corrected.    Jomarie Longs, MD 08/03/2023, 8:05 AM

## 2023-08-04 LAB — TSH: TSH: 2.83 u[IU]/mL (ref 0.450–4.500)

## 2023-08-04 LAB — PROLACTIN: Prolactin: 11.3 ng/mL (ref 3.6–25.2)

## 2023-08-19 ENCOUNTER — Other Ambulatory Visit: Payer: Self-pay | Admitting: Psychiatry

## 2023-08-19 DIAGNOSIS — G4701 Insomnia due to medical condition: Secondary | ICD-10-CM

## 2023-08-22 ENCOUNTER — Telehealth: Payer: Self-pay

## 2023-08-22 ENCOUNTER — Other Ambulatory Visit: Payer: Self-pay | Admitting: Psychiatry

## 2023-08-22 DIAGNOSIS — G4701 Insomnia due to medical condition: Secondary | ICD-10-CM

## 2023-08-22 NOTE — Telephone Encounter (Signed)
What pharmacy ?

## 2023-08-22 NOTE — Telephone Encounter (Signed)
pt called states she needed a refill on the trazodone. pt states that she takes 1 and 1/2 tablets and that pharmacy states she has no refills. pt states she was only given 90 pills instead of 135 pills. and she thinks that is why.

## 2023-08-23 NOTE — Telephone Encounter (Signed)
the [facility] was the correct one she said it had not changed. i notified her that rx was sent to the pharmacy

## 2023-09-25 ENCOUNTER — Other Ambulatory Visit: Payer: Self-pay | Admitting: Psychiatry

## 2023-09-25 DIAGNOSIS — G4701 Insomnia due to medical condition: Secondary | ICD-10-CM

## 2023-10-02 ENCOUNTER — Other Ambulatory Visit: Payer: Self-pay | Admitting: Obstetrics and Gynecology

## 2023-10-02 DIAGNOSIS — N3281 Overactive bladder: Secondary | ICD-10-CM

## 2023-11-29 IMAGING — MG MM DIGITAL SCREENING BILAT W/ TOMO AND CAD
6 of 10 series · 6 of 30 positions shown · non-contrast
Comparison: Previous exam(s).

CLINICAL DATA: Screening.

EXAM:
DIGITAL SCREENING BILATERAL MAMMOGRAM WITH TOMOSYNTHESIS AND CAD
TECHNIQUE: Bilateral screening digital craniocaudal and mediolateral oblique
mammograms were obtained. Bilateral screening digital breast
tomosynthesis was performed. The images were evaluated with
computer-aided detection.

[R MLO synth-2D (1 of 2)]
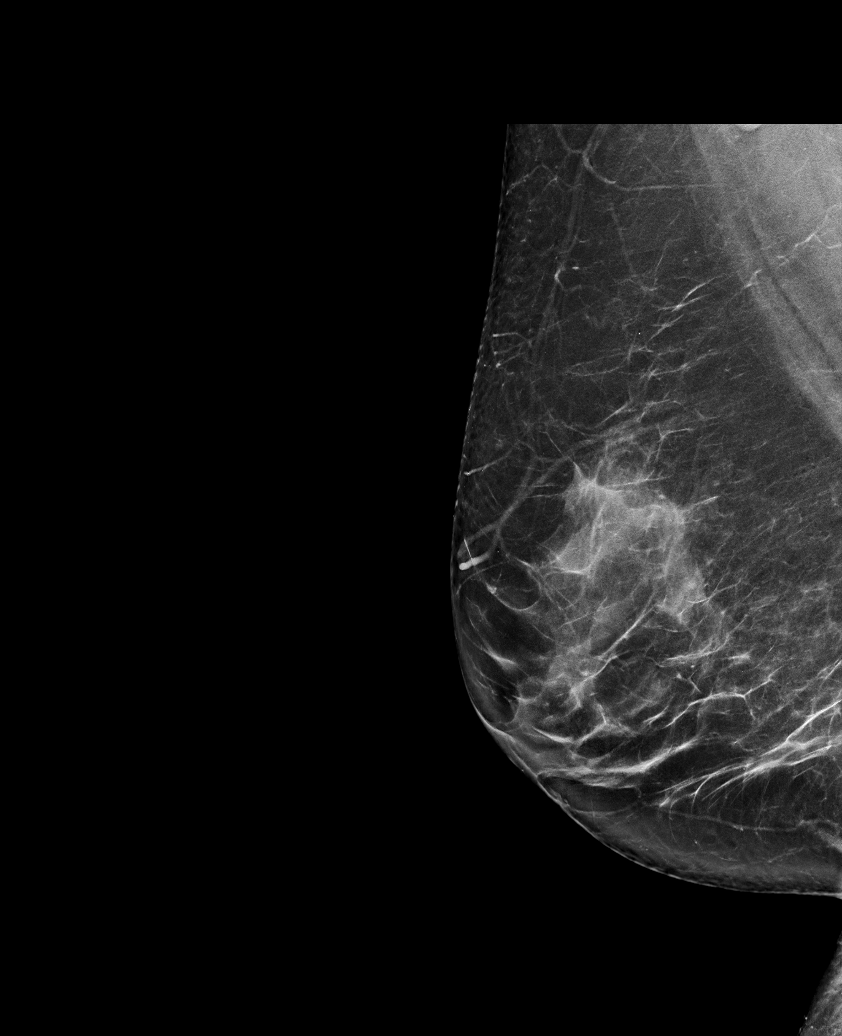

[L MLO synth-2D]
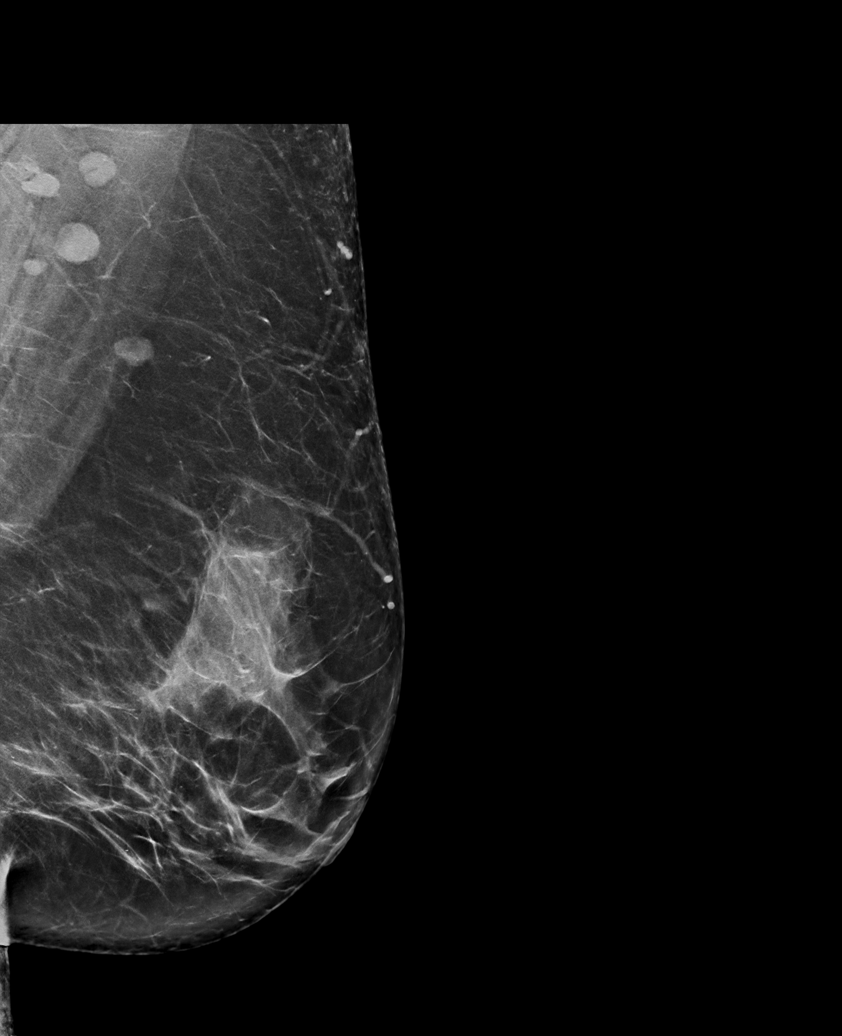

[R CC synth-2D]
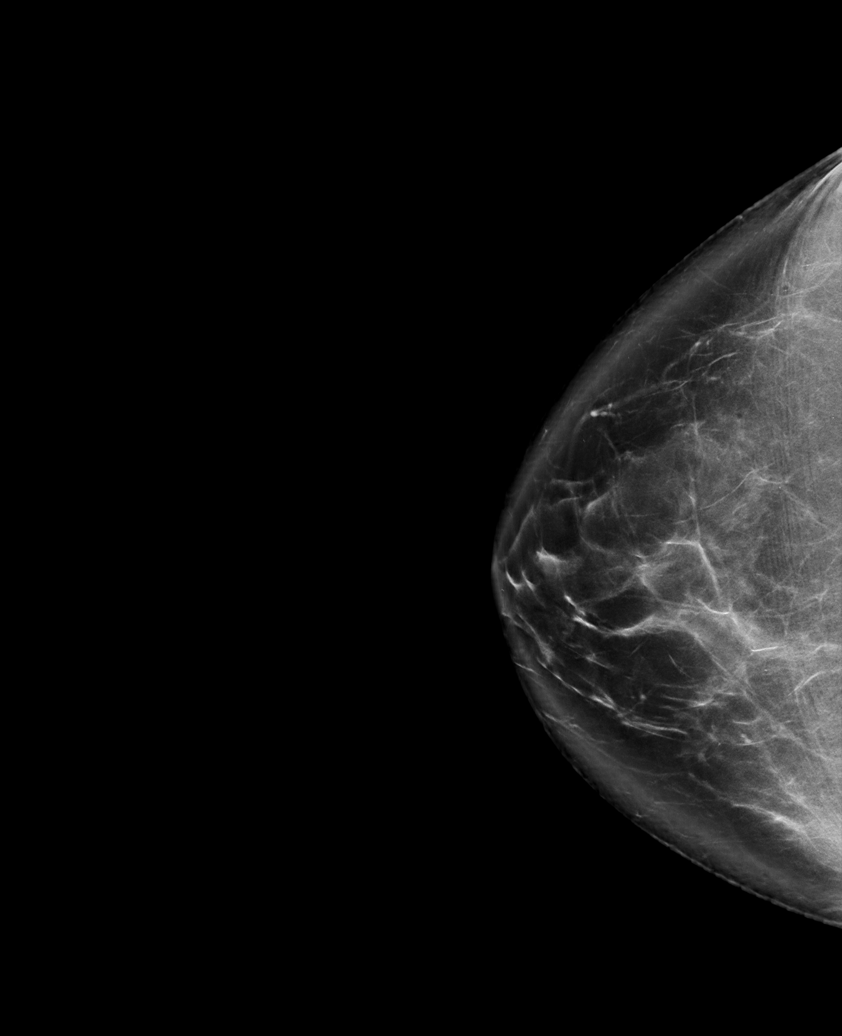

[R MLO synth-2D (2 of 2)]
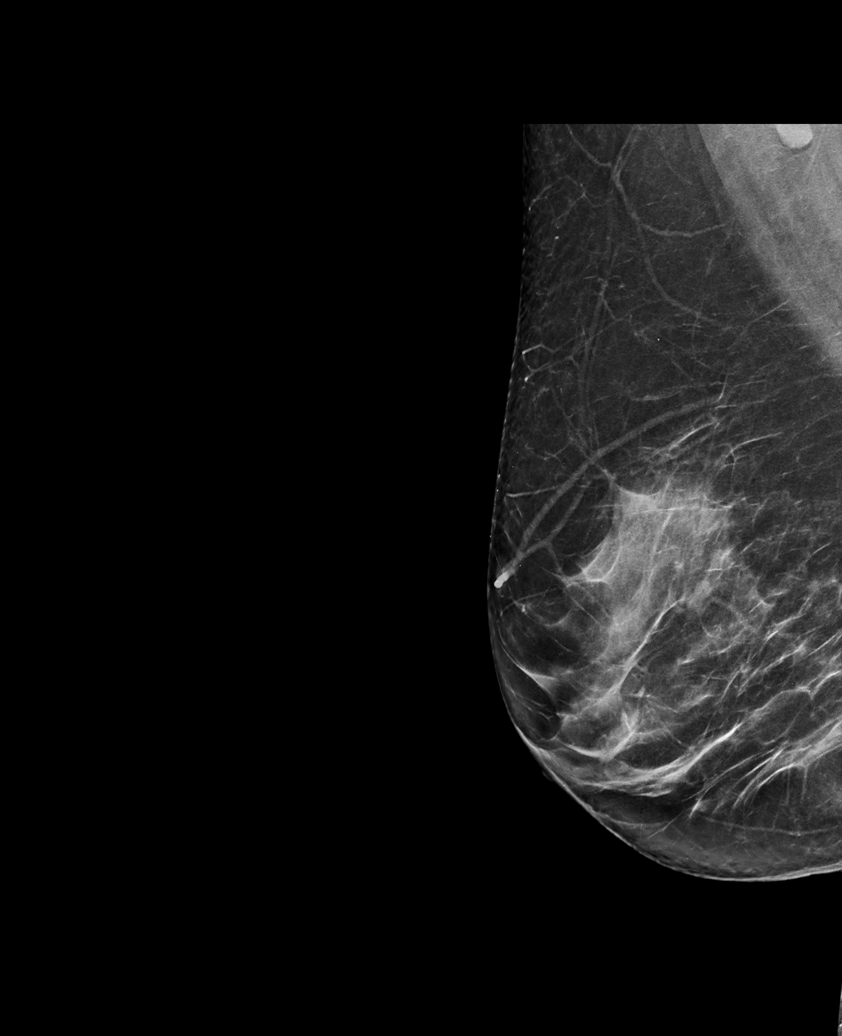

[L CC synth-2D]
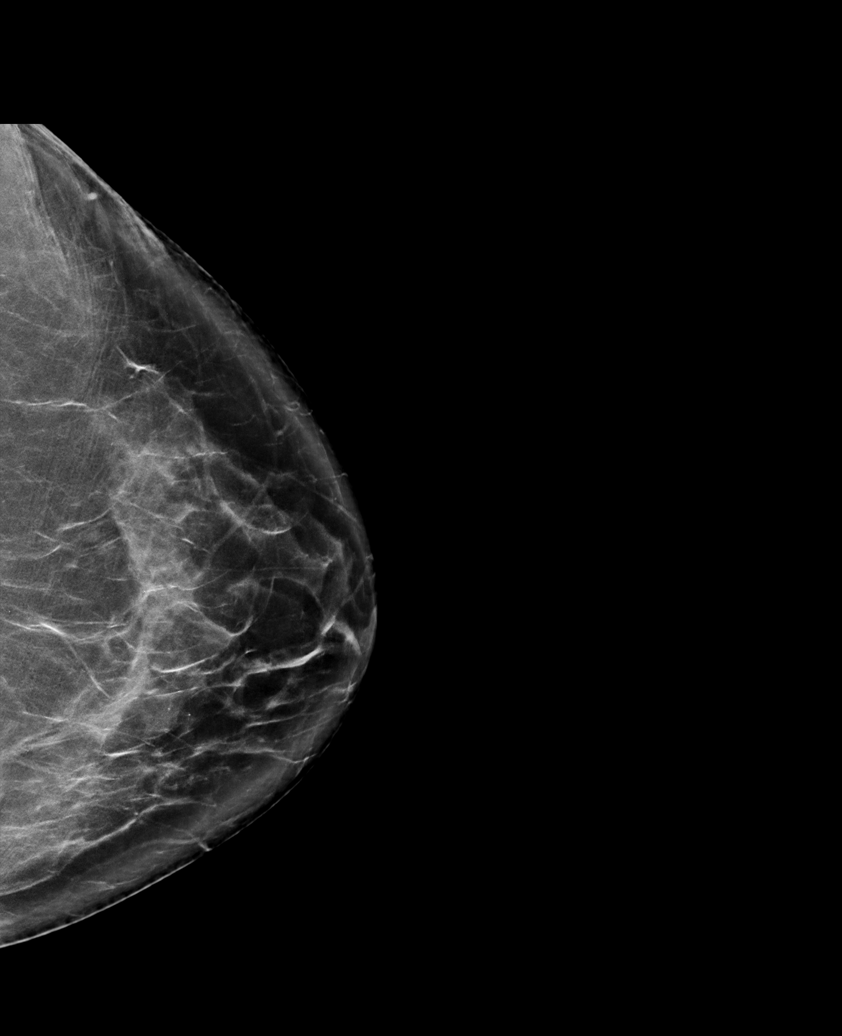

[L MLO tomo · tomo slice 45/90.0]
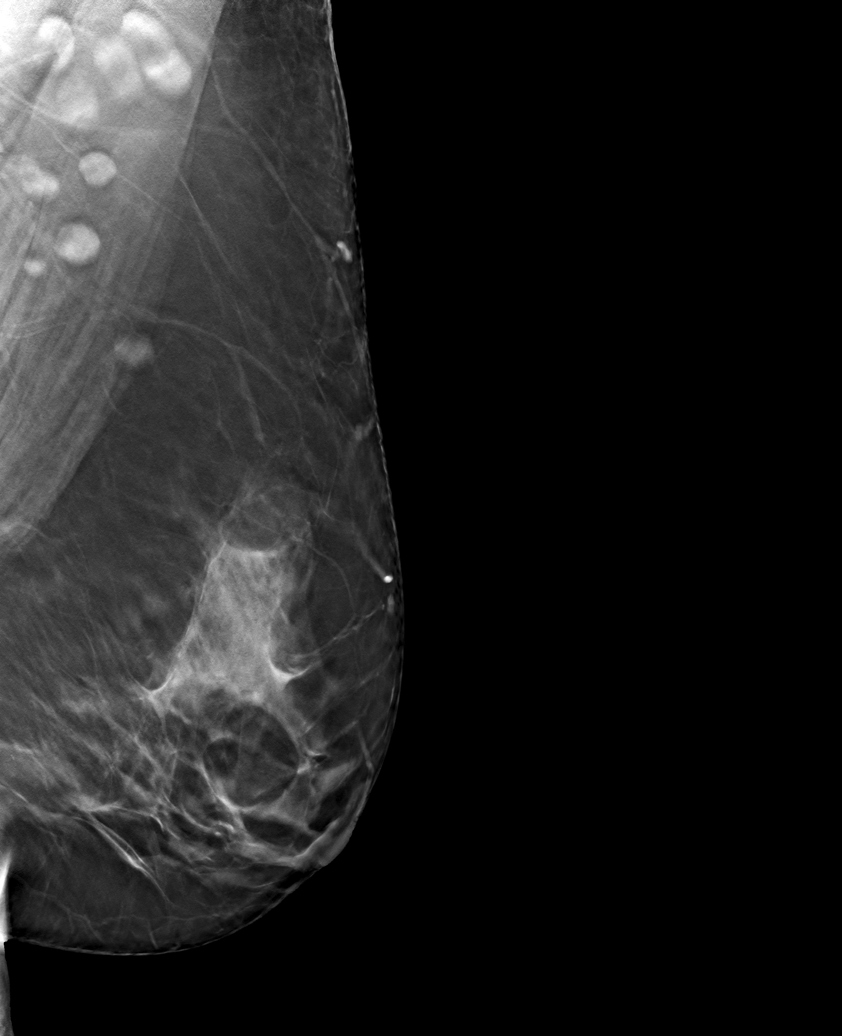

[6 of 30 positions shown; findings below may reference images not displayed]

ACR Breast Density Category c: The breast tissue is heterogeneously
dense, which may obscure small masses.
FINDINGS: There are no findings suspicious for malignancy.
IMPRESSION: No mammographic evidence of malignancy. A result letter of this
screening mammogram will be mailed directly to the patient.

RECOMMENDATION:
Screening mammogram in one year. (Code:Q3-W-BC3)

BI-RADS CATEGORY  1: Negative.

## 2024-01-23 ENCOUNTER — Encounter: Payer: Self-pay | Admitting: Psychiatry

## 2024-01-23 ENCOUNTER — Ambulatory Visit: Payer: 59 | Admitting: Psychiatry

## 2024-01-23 VITALS — BP 124/78 | HR 80 | Temp 98.7°F | Ht 69.0 in | Wt 207.0 lb

## 2024-01-23 DIAGNOSIS — G4701 Insomnia due to medical condition: Secondary | ICD-10-CM | POA: Diagnosis not present

## 2024-01-23 DIAGNOSIS — Z79899 Other long term (current) drug therapy: Secondary | ICD-10-CM

## 2024-01-23 DIAGNOSIS — Z634 Disappearance and death of family member: Secondary | ICD-10-CM

## 2024-01-23 DIAGNOSIS — F3176 Bipolar disorder, in full remission, most recent episode depressed: Secondary | ICD-10-CM

## 2024-01-23 MED ORDER — DIVALPROEX SODIUM ER 500 MG PO TB24
1000.0000 mg | ORAL_TABLET | Freq: Every day | ORAL | 3 refills | Status: AC
Start: 2024-01-23 — End: ?

## 2024-01-23 MED ORDER — HYDROXYZINE PAMOATE 25 MG PO CAPS
25.0000 mg | ORAL_CAPSULE | Freq: Two times a day (BID) | ORAL | 1 refills | Status: AC | PRN
Start: 2024-01-23 — End: ?

## 2024-01-23 NOTE — Patient Instructions (Signed)
 Managing Loss, Adult  People experience loss in many different ways throughout their lives. Events such as moving, changing jobs, and losing friends can create a sense of loss. The loss may be as serious as a major health change, divorce, death of a pet, or death of a loved one. All of these types of loss are likely to create a physical and emotional reaction known as grief. Grief is the result of a major change or an absence of something or someone that you count on. Grief is a normal reaction to loss.  A variety of factors can affect your grieving experience, including:  The nature of your loss.  Your relationship to what or whom you lost.  Your understanding of grief and how to manage it.  Your support system.  Be aware that when grief becomes extreme, it can lead to more severe issues like isolation, depression, anxiety, or suicidal thoughts. Talk with your health care provider if you have any of these issues.  How to manage lifestyle changes    Keep to your normal routine as much as possible.  If you have trouble focusing or doing normal activities, it is acceptable to take some time away from your normal routine.  Spend time with friends and loved ones.  Eat a healthy diet, get plenty of sleep, and rest when you feel tired.  How to recognize changes   The way that you deal with your grief will affect your ability to function as you normally do. When grieving, you may experience these changes:  Numbness, shock, sadness, anxiety, anger, denial, and guilt.  Thoughts about death.  Unexpected crying.  A physical sensation of emptiness in your stomach.  Problems sleeping and eating.  Tiredness (fatigue).  Loss of interest in normal activities.  Dreaming about or imagining seeing the person who died.  A need to remember what or whom you lost.  Difficulty thinking about anything other than your loss for a period of time.  Relief. If you have been expecting the loss for a while, you may feel a sense of relief when it  happens.  Follow these instructions at home:  Activity    Express your feelings in healthy ways, such as:  Talking with others about your loss. It may be helpful to find others who have had a similar loss, such as a support group.  Writing down your feelings in a journal.  Doing physical activities to release stress and emotional energy.  Doing creative activities like painting, sculpting, or playing or listening to music.  Practicing resilience. This is the ability to recover and adjust after facing challenges. Reading some resources that encourage resilience may help you to learn ways to practice those behaviors.     General instructions  Be patient with yourself and others. Allow the grieving process to happen, and remember that grieving takes time.  It is likely that you may never feel completely done with some grief. You may find a way to move on while still cherishing memories and feelings about your loss.  Accepting your loss is a process. It can take months or longer to adjust.  Keep all follow-up visits. This is important.  Where to find support  To get support for managing loss:  Ask your health care provider for help and recommendations, such as grief counseling or therapy.  Think about joining a support group for people who are managing a loss.  Where to find more information  You can find more information  about managing loss from:  American Society of Clinical Oncology: www.cancer.net  American Psychological Association: [employer]  Contact a health care provider if:  Your grief is extreme and keeps getting worse.  You have ongoing grief that does not improve.  Your body shows symptoms of grief, such as illness.  You feel depressed, anxious, or hopeless.  Get help right away if:  You have thoughts about hurting yourself or others.  Get help right away if you feel like you may hurt yourself or others, or have thoughts about taking your own life. Go to your nearest emergency room or:  Call 911.  Call the  National Suicide Prevention Lifeline at 985-787-5965 or 988. This is open 24 hours a day.  Text the Crisis Text Line at (308)798-1395.  Summary  Grief is the result of a major change or an absence of someone or something that you count on. Grief is a normal reaction to loss.  The depth of grief and the period of recovery depend on the type of loss and your ability to adjust to the change and process your feelings.  Processing grief requires patience and a willingness to accept your feelings and talk about your loss with people who are supportive.  It is important to find resources that work for you and to realize that people experience grief differently. There is not one grieving process that works for everyone in the same way.  Be aware that when grief becomes extreme, it can lead to more severe issues like isolation, depression, anxiety, or suicidal thoughts. Talk with your health care provider if you have any of these issues.  This information is not intended to replace advice given to you by your health care provider. Make sure you discuss any questions you have with your health care provider.  Document Revised: 06/20/2021 Document Reviewed: 06/20/2021  Elsevier Patient Education  2024 [employer].

## 2024-01-23 NOTE — Progress Notes (Unsigned)
 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  Bipolar disorder is managed with Depakote 1000 mg and Latuda 60 mg. Memory is good, and there are no issues with work or driving. She is sleeping through the night and has been managing without hydroxyzine, which was previously used for sleep and anxiety. Trazodone is also taken for sleep.  She is experiencing grief following the recent death of her mother's cousin, who was like a second mother to her. She has been feeling down but is supported by friends and family. She does not currently see a therapist but has support from her preacher and family.  Ongoing issues with sinus and allergies have been exacerbated by the weather. She is awaiting scheduling for sinus surgery, which is expected to require a week off work. She plans to stay with her mother during recovery, who lives nearby. She takes Xyzal for allergies and Flonase nasal spray as prescribed by her sinus doctor.   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  She is currently under the care of dermatology for the same.  She works at [employer] for 28 years and lives close to family, including her mother and grandchildren.   Visit Diagnosis:    ICD-10-CM   1. Bipolar disorder, in full remission, most recent episode depressed (HCC)  F31.76 Valproic acid level    Hepatic function panel    divalproex (DEPAKOTE ER) 500 MG 24 hr tablet    hydrOXYzine (VISTARIL) 25 MG capsule    2. Insomnia due to medical condition  G47.01 hydrOXYzine (VISTARIL) 25 MG capsule   Anxiety    3. High risk medication use   Z79.899 Valproic acid level    Sodium    Hepatic function panel    Platelet count    4. Bereavement  Z63.4 hydrOXYzine (VISTARIL) 25 MG capsule      Past Psychiatric History: I have reviewed past psychiatric history from progress note on 02/12/2018.  Past Medical History:  Past Medical History:  Diagnosis Date   Acid reflux    Adnexal mass    Bipolar disorder (HCC)    Depression    Depression    Herpes    Irregular menstrual bleeding    Melanoma (HCC)     Past Surgical History:  Procedure Laterality Date   CERVICAL BIOPSY     Dr. Tiburcio Pea with [facility]   COLONOSCOPY     COLONOSCOPY     DILATION AND CURETTAGE OF UTERUS     MELANOMA EXCISION     TONSILLECTOMY      Family Psychiatric History: I have reviewed family psychiatric history from progress note on 02/12/2018.  Family History:  Family History  Problem Relation Age of Onset   Heart attack Father    Cancer Father        melanoma   Breast cancer Maternal Aunt        33s   Breast cancer Paternal Aunt        50s/60s   Breast cancer Cousin        42s    Social History: I  have reviewed social history from progress note on 02/12/2018. Social History   Socioeconomic History   Marital status: Divorced    Spouse name: Not on file   Number of children: 1   Years of education: Not on file   Highest education level: Associate degree: occupational, Scientist, product/process development, or vocational program  Occupational History    Comment: full time  Tobacco Use   Smoking status: Former    Current packs/day: 0.00    Types: Cigarettes    Quit date: 02/12/1990    Years since quitting: 33.9   Smokeless tobacco: Never  Vaping Use   Vaping status: Never Used  Substance and Sexual Activity   Alcohol use: No    Alcohol/week: 0.0 standard drinks of alcohol   Drug use: No   Sexual activity: Not Currently    Birth control/protection: Post-menopausal  Other Topics Concern   Not on file  Social History Narrative   Not on file   Social  Drivers of Health   Financial Resource Strain: Low Risk  (07/28/2021)   Overall Financial Resource Strain (CARDIA)    Difficulty of Paying Living Expenses: Not hard at all  Food Insecurity: No Food Insecurity (07/28/2021)   Hunger Vital Sign    Worried About Running Out of Food in the Last Year: Never true    Ran Out of Food in the Last Year: Never true  Transportation Needs: No Transportation Needs (07/28/2021)   PRAPARE - Administrator, Civil Service (Medical): No    Lack of Transportation (Non-Medical): No  Physical Activity: Insufficiently Active (07/28/2021)   Exercise Vital Sign    Days of Exercise per Week: 1 day    Minutes of Exercise per Session: 20 min  Stress: Stress Concern Present (07/28/2021)   [employer] of Occupational Health - Occupational Stress Questionnaire    Feeling of Stress : To some extent  Social Connections: Moderately Isolated (07/28/2021)   Social Connection and Isolation Panel [NHANES]    Frequency of Communication with Friends and Family: More than three times a week    Frequency of Social Gatherings with Friends and Family: More than three times a week    Attends Religious Services: 1 to 4 times per year    Active Member of [employer] or Organizations: No    Attends Banker Meetings: Never    Marital Status: Divorced    Allergies:  Allergies  Allergen Reactions   Penicillins Hives and Other (See Comments)    delusional   Sulfa Antibiotics Hives and Other (See Comments)    dellusional dellusional Other reaction(s): Other (See Comments) dellusional   Prednisone Other (See Comments)    Heart racing    Metabolic Disorder Labs: Lab Results  Component Value Date   HGBA1C 5.3 12/17/2020   Lab Results  Component Value Date   PROLACTIN 11.3 08/03/2023   PROLACTIN 10.6 12/17/2020   Lab Results  Component Value Date   CHOL 226 (H) 07/29/2021   TRIG 181 (H) 07/29/2021   HDL 50 07/29/2021   CHOLHDL 4.5 (H) 07/29/2021    LDLCALC 143 (H) 07/29/2021   LDLCALC 141 (H) 08/25/2019   Lab Results  Component Value Date   TSH 2.830 08/03/2023   TSH 1.700 07/29/2021    Therapeutic Level Labs: No results found for: "LITHIUM" Lab Results  Component Value Date   VALPROATE 40 (L) 04/06/2023   VALPROATE 40 (L) 03/22/2022   No results found for: "CBMZ"  Current Medications: Current Outpatient Medications  Medication Sig Dispense Refill   acetaminophen (TYLENOL) 650 MG CR tablet Take by mouth.     acitretin (SORIATANE) 10 MG capsule Take 10 mg by mouth daily.     Ascorbic Acid (VITAMIN C) 1000 MG tablet Take 2,000 mg by mouth daily.     Cholecalciferol 25 MCG (1000 UT) tablet Take by mouth.     fluticasone (FLONASE) 50 MCG/ACT nasal spray Place 2 sprays into both nostrils daily.     hydrOXYzine (VISTARIL) 25 MG capsule Take 1 capsule (25 mg total) by mouth 2 (two) times daily as needed for anxiety. 60 capsule 1   levocetirizine (XYZAL) 5 MG tablet Take 5 mg by mouth every evening.     Lurasidone HCl 60 MG TABS TAKE 1 TABLET BY MOUTH DAILY  WITH SUPPER 90 tablet 3   minoxidil (LONITEN) 2.5 MG tablet Take 1.25 mg by mouth daily.     neomycin-polymyxin b-dexamethasone (MAXITROL) 3.5-10000-0.1 SUSP   0   pimecrolimus (ELIDEL) 1 % cream Apply topically.     tolterodine (DETROL LA) 4 MG 24 hr capsule Take 1 capsule (4 mg total) by mouth daily. 30 capsule 2   traZODone (DESYREL) 100 MG tablet Take 1.5 tablets (150 mg total) by mouth at bedtime as needed for sleep. 135 tablet 3   triamcinolone cream (KENALOG) 0.1 % APPLY TO AFFECTED AREA TWICE A DAY     divalproex (DEPAKOTE ER) 500 MG 24 hr tablet Take 2 tablets (1,000 mg total) by mouth daily. 180 tablet 3   No current facility-administered medications for this visit.     Musculoskeletal: Strength & Muscle Tone: within normal limits Gait & Station: normal Patient leans: N/A  Psychiatric Specialty Exam: Review of Systems  Psychiatric/Behavioral:          Grief    Blood pressure 124/78, pulse 80, temperature 98.7 F (37.1 C), temperature source Temporal, height 5\' 9"  (1.753 m), weight 207 lb (93.9 kg), SpO2 98%.Body mass index is 30.57 kg/m.  General Appearance: Casual  Eye Contact:  Fair  Speech:  Clear and Coherent  Volume:  Normal  Mood:   grief  Affect:  Congruent  Thought Process:  Goal Directed and Descriptions of Associations: Intact  Orientation:  Full (Time, Place, and Person)  Thought Content: Logical   Suicidal Thoughts:  No  Homicidal Thoughts:  No  Memory:  Immediate;   Fair Recent;   Fair Remote;   Fair  Judgement:  Fair  Insight:  Fair  Psychomotor Activity:  Normal  Concentration:  Concentration: Fair and Attention Span: Fair  Recall:  [employer] of Knowledge: Fair  Language: Fair  Akathisia:  No  Handed:  Right  AIMS (if indicated): done  Assets:  Desire for Improvement Housing Social Support Transportation  ADL's:  Intact  Cognition: WNL  Sleep:  Fair   Screenings: Geneticist, molecular Office Visit from 08/01/2023 in [facility] Office Visit from 12/15/2022 in [facility] Office Visit from 06/16/2022 in [facility] Office Visit from 03/03/2022 in [facility] Office Visit from 08/19/2021 in [facility]  AIMS Total Score 0 0 0 0 0      GAD-7    Flowsheet Row Office Visit from 08/01/2023 in [facility] Office Visit from 12/15/2022 in [facility] Office Visit from 06/16/2022 in [facility] Office Visit from 08/19/2021 in [facility] Office Visit from 07/28/2021 in [facility]  Total GAD-7 Score 0 0 0 3 0      PHQ2-9     Flowsheet Row Office Visit from 08/01/2023 in [facility] Office Visit from 02/12/2023 in [facility] Office Visit from 12/15/2022 in [facility] Office Visit from 07/27/2022 in [facility] Office Visit from 06/16/2022 in [facility]  PHQ-2 Total Score 0 0 0 0 0  PHQ-9 Total Score -- 0 -- 0 1      Flowsheet Row Office Visit from 08/01/2023 in [facility] Office Visit from 12/15/2022 in [facility] Office Visit from 06/16/2022 in [facility]  C-SSRS RISK CATEGORY No Risk No Risk No Risk        Assessment and Plan: Barbara [name] is a 58 year old Caucasian female, employed, divorced, lives in [place], has a history of bipolar disorder was evaluated in office today.  Discussed assessment and plan as noted below.  Bipolar Disorder-in remission Barbara [name] is diagnosed with bipolar disorder and is currently on Depakote 1000 mg and Latuda 60 mg. She reports no issues with memory, work performance, or driving. Sleep is adequate without hydroxyzine, previously used for sleep. No muscle spasms, rigidity, tremors, or oral movements are reported. Hydroxyzine is available for emergency use, but should not be taken with Xyzal due to potential side effects such as dry mouth, constipation, and drowsiness.   - Continue Depakote 1000 mg daily at bedtime ( depakote level - 40, 04/06/23) - Continue Latuda 60 mg with meals  Insomnia-stable Currently reports sleep is overall good. - Continue Hydroxyzine 25 mg twice a day as needed for anxiety and sleep - Continue Trazodone 150 mg at bedtime as needed  Bereavement-unstable Madalaine is experiencing grief following the recent passing of a close family member. She is managing with  support from friends, family, and a Programmer, multimedia, and does not wish to see a therapist or counselor at this time. Hydroxyzine, which can help with anxiety and grief, is available if needed.   - Provide information about grief   - Continue Hydroxyzine 25 mg capsule twice a day as needed  High risk medication use-I have ordered labs including valproic acid level, sodium, hepatic function platelet count.  Patient to go to [facility]  Lab slip provided. I have reviewed and discussed labs dated 08/03/2023-TSH and prolactin level-within normal limits.  Follow-up   August is scheduled for a follow-up in five months. Labs are to be conducted in May. She is advised to establish care with a primary care physician and to take vitamin D regularly.   - Schedule follow-up appointment in August   - Order labs for May 2025   - Advise establishing care with a primary care physician   - Encourage regular intake of vitamin D   Collaboration of Care: Collaboration of Care: Primary Care Provider AEB encouraged to establish care with primary care provider.  Patient/Guardian was advised Release of Information must be obtained prior to any record release in order to collaborate their care with an outside provider. Patient/Guardian was advised if they have not already done so to contact the registration department to sign all necessary forms in order for [place] to release information regarding their care.   Consent:  Patient/Guardian gives verbal consent for treatment and assignment of benefits for services provided during this visit. Patient/Guardian expressed understanding and agreed to proceed.  Discussed the use of a AI scribe software for clinical note transcription with the patient, who gave verbal consent to proceed.  This note was generated in part or whole with voice recognition software. Voice recognition is usually quite accurate but there are transcription errors that can and very often do occur. I apologize for any  typographical errors that were not detected and corrected.     Jomarie Longs, MD 01/25/2024, 5:16 AM

## 2024-02-12 ENCOUNTER — Other Ambulatory Visit: Payer: Self-pay | Admitting: Otolaryngology

## 2024-02-27 ENCOUNTER — Encounter: Payer: Self-pay | Admitting: Otolaryngology

## 2024-02-28 NOTE — Anesthesia Preprocedure Evaluation (Addendum)
 Anesthesia Evaluation  Patient identified by MRN, date of birth, ID band Patient awake    Reviewed: Allergy & Precautions, H&P , NPO status , Patient's Chart, lab work & pertinent test results  History of Anesthesia Complications (+) PONV and history of anesthetic complications  Airway Mallampati: III  TM Distance: <3 FB Neck ROM: Full    Dental no notable dental hx. (+) Caps, Chipped, Poor Dentition   Pulmonary former smoker   Pulmonary exam normal breath sounds clear to auscultation       Cardiovascular negative cardio ROS Normal cardiovascular exam Rhythm:Regular Rate:Normal     Neuro/Psych  PSYCHIATRIC DISORDERS Anxiety Depression Bipolar Disorder   negative neurological ROS  negative psych ROS   GI/Hepatic negative GI ROS, Neg liver ROS,GERD  ,,Rare, mild GERD   Endo/Other  negative endocrine ROS    Renal/GU Renal diseasenegative Renal ROS  negative genitourinary   Musculoskeletal negative musculoskeletal ROS (+)    Abdominal   Peds negative pediatric ROS (+)  Hematology negative hematology ROS (+)   Anesthesia Other Findings Depression  Melanoma Depression Acid reflux  Bipolar disorder  PONV (postoperative nausea and vomiting)  Vertigo    Reproductive/Obstetrics negative OB ROS                             Anesthesia Physical Anesthesia Plan  ASA: 2  Anesthesia Plan: General ETT   Post-op Pain Management:    Induction: Intravenous  PONV Risk Score and Plan:   Airway Management Planned: Oral ETT  Additional Equipment:   Intra-op Plan:   Post-operative Plan: Extubation in OR  Informed Consent: I have reviewed the patients History and Physical, chart, labs and discussed the procedure including the risks, benefits and alternatives for the proposed anesthesia with the patient or authorized representative who has indicated his/her understanding and acceptance.      Dental Advisory Given  Plan Discussed with: Anesthesiologist, CRNA and Surgeon  Anesthesia Plan Comments: (Patient consented for risks of anesthesia including but not limited to:  - adverse reactions to medications - damage to eyes, teeth, lips or other oral mucosa - nerve damage due to positioning  - sore throat or hoarseness - Damage to heart, brain, nerves, lungs, other parts of body or loss of life  Patient voiced understanding and assent.)       Anesthesia Quick Evaluation

## 2024-03-03 NOTE — Discharge Instructions (Signed)

## 2024-03-05 NOTE — H&P (Signed)
 Chief Complaints: 1. F/U Sinusitis, chronic, Bilateral evaluated on January 14, 2024 HPI: This is a 58 year old female who: is following up for sinusitis, chronic, bilateral. She was seen on January 14, 2024, at which time The following treatment regimen was given:  Begin the following treatments: ** Recommend to OR for bilateral FESS (maxillary and ethmoid sinuses), CT image guidance, endoscopic septoplasty and bilateral coblation / outfracture of inferior turbinates. Anticipate DC home same day and RTC - Dr. Jolly Needle at [facility] office 7-10 days postop. Anticipate [facility] OR.. Care Regimen: Discussed the risks, benefits, and options of this procedure with the patient / family today - they understand and agree to proceed. The patient / family does have all of our contact information if needed. The patient presents for further evaluation and management. Interval History: Patient is in today to discuss surgery, as her insurance only approved her to have a septoplasty and turbinates done. She wants to discuss getting this done now versus waiting to have the FESS done with the septoplasty at a later time. ---- Saw Ms. Zebrowski back today to discuss our prior recommendation for bilateral FESS (maxillary and ethmoid sinuses), CT image guidance, endoscopic septoplasty and bilateral coblation / outfracture of inferior turbinates. In addition to nasal obstruction from enlarged inferior turbinates and nasal septal deformity, she has had multiple courses (4-5) of oral antibiotics over the past year for sinus infections despite treatment with maximal medical therapy and continues to have persistent sx including facial pain and pressure, chronic post-nasal drainage, chronic cough and fatigue. There was evidence as well of bilateral ethmoid and maxillary sinus disease on her recent CT scan of the sinuses obtained after maximal medical therapy. She is frustrated as only the septoplasty and reduction of  inferior turbinates portion of the surgery was approved by her insurance carrier, [employer]. I will inquire regarding a potential P2P call. 1. Vitals: Date Taken By B.P. Pulse Resp. O2 Sat. Temp. Ht. Wt. BMI BSA 02/01/24 13:43 WILLIFORD, JOSHUA 122/70 SIT 97.3 F 69.0 in 202.0 lbs* 29.8 2.1 FiO2 * Patient Reported Exam: An Otolaryngologic exam was performed Otolaryngologic exam External Ears: external ear examination of normal size and morphology without traumatic or congenital deformity AD, external ear examination of normal size and morphology without traumatic or congenital deformity AS. External ear canal AD: Normal EAC exam External ear canal AS: Normal EAC exam Tympanic membrane AD: AD tympanic membrane Tympanic membrane AS: AS tympanic membrane Visit Note - February 01, 2024 Barbara [name], Barbara [name] MRN: [id number] Phone: [phone] DOB: 10/24/1966 Sex: Female PMS ID: [id number] Roosevelt Coles (Primary Provider) [facility] Under) Page 2 [phone] Work [phone] Fax [facility] [place] [address] [place], [place] [address] Reviewed February 01, 2024. Penicillins Sulfa (Sulfonamide Antibiotics) prednisone Alerts No allergy to shellfish/iodine and no allergy to latex. ROS Provider reviewed on Feb 01, 2024. A focused review of systems was performed including ENT and Mouth and Musculoskeletal and was notable for pnd. No Dysphagia, No Hearing Loss, No Loss Of Smell, No Otalgia, No Otorrhea, No Throat Pain, No Tinnitus, No Tongue Swelling, No Neck Mass, And No Neck Pain. Family History Reviewed February 01, 2024. Family history of cancer (situation) - Father Family history of ischemic heart disease (situation) - Father Family history: Hypercholesterolemia (situation) - Father - Mother Medical History Reviewed February 01, 2024. H/O: depression Surgical History Reviewed February 01, 2024. None intact, no fluid,  normal mobility on pneumotoscopy intact, no  fluid, normal mobility on pneumotoscopy External Nose: Nasal dorsum midline Nasal cavity: Right nasal cavity: inferior turbinate hypertrophy, mucosal edema, mucosal erythema, and mucosal inflammation; The remainder of the right nasal cavity was normal with the exception of the above findings. Left nasal cavity: inferior turbinate hypertrophy, mucosal edema, mucosal erythema, mucosal inflammation, and septal deviation, convex, airway obstruction 25 - 50%; The remainder of the left nasal cavity was normal with the exception of the above findings. Lips, Teeth, Gums: normal lip morphology and anatomy, class I occlusion, no dental abnormalities  Oral cavity/Oropharynx: normal hard and soft palate, tongue, pharyngeal walls, buccal mucosa, floor of mouth, and tonsils Head Inspection: Normal head inspection with normal head shape, without masses or concerning lesions.  Head Palpation: Normal head inspection without masses, palpable deformities, or concerning lesions. Salivary: No palpable salivary gland masses - no erythema or tenderness. Facial nerve intact and symmetric bilaterally. Neck: normal neck examination without skin masses, tenderness or crepitus  Thyroid: normal thyroid examination without masses or nodules Respiratory Effort: normal respiratory effort without labored breathing or accessory muscle use  Neck Lymph Node: normal lymphatic exam without lymphadenopathy in cranial or cervical regions Neuro - Cranial Nerves: Cranial nerves II-XII intact. Appearance: Small for age of 17 mos, but appears alert - can support head. No stridor or airway distress. Communication: normal vocal quality and ability to communicate Orientation: Alert and oriented to person, place, time. Mood:mood and affect well-adjusted, pleasant and cooperative, appropriate for clinical and encounter Circumstances  Chest - clear to auscultation bilaterally C/V - regular  rate and rhythm without murmur    Impression/Plan: Sinusitis, chronic Chronic maxillary sinusitis (J32.0) Plan: Treatment Regimen. 1. Visit Note - February 01, 2024 Barbara [name], Barbara [name] MRN: [id number] Phone: [phone] DOB: 11-Nov-1966 Sex: Female PMS ID: [id number] Roosevelt Coles (Primary Provider) [facility] Under) Page 3 [phone] Work [phone] Fax [facility] [place] [address] [place], [place] [address] Begin the following treatments: ** Recommend to OR for bilateral FESS (maxillary and ethmoid sinuses), CT image guidance, endoscopic septoplasty and bilateral coblation / outfracture of inferior turbinates. Anticipate DC home same day and RTC - Dr. Jolly Needle at [facility] office 7-10 days postop. Anticipate [facility] OR. Will set up P2P call as the FESS portion of this recommended procedure was denied by the patient's insurance carrier, [facility].. Care Regimen: Discussed the risks, benefits, and options of this procedure with the patient / family today - they understand and agree to proceed. The patient / family does have all of our contact information if needed.  Sherral Do. Jolly Needle, MD, MBA, [facility] [place] [facility] [phone]

## 2024-03-06 ENCOUNTER — Encounter
Admission: RE | Disposition: A | Discharge status: Home / Self Care | Payer: Self-pay | Source: Home / Self Care | Attending: Otolaryngology

## 2024-03-06 ENCOUNTER — Ambulatory Visit: Admitting: Anesthesiology

## 2024-03-06 ENCOUNTER — Ambulatory Visit
Admission: RE | Admit: 2024-03-06 | Discharge: 2024-03-06 | Disposition: A | Discharge status: Home / Self Care | Attending: Otolaryngology | Admitting: Otolaryngology

## 2024-03-06 ENCOUNTER — Other Ambulatory Visit: Payer: Self-pay

## 2024-03-06 ENCOUNTER — Encounter: Payer: Self-pay | Admitting: Otolaryngology

## 2024-03-06 DIAGNOSIS — J343 Hypertrophy of nasal turbinates: Secondary | ICD-10-CM | POA: Diagnosis not present

## 2024-03-06 DIAGNOSIS — Z87891 Personal history of nicotine dependence: Secondary | ICD-10-CM | POA: Diagnosis not present

## 2024-03-06 DIAGNOSIS — K219 Gastro-esophageal reflux disease without esophagitis: Secondary | ICD-10-CM | POA: Insufficient documentation

## 2024-03-06 DIAGNOSIS — J342 Deviated nasal septum: Secondary | ICD-10-CM | POA: Insufficient documentation

## 2024-03-06 DIAGNOSIS — J329 Chronic sinusitis, unspecified: Secondary | ICD-10-CM | POA: Diagnosis not present

## 2024-03-06 DIAGNOSIS — J320 Chronic maxillary sinusitis: Secondary | ICD-10-CM | POA: Diagnosis present

## 2024-03-06 HISTORY — PX: SEPTOPLASTY: SHX2393

## 2024-03-06 HISTORY — PX: IMAGE GUIDED SINUS SURGERY: SHX6570

## 2024-03-06 HISTORY — PX: MAXILLARY ANTROSTOMY: SHX2003

## 2024-03-06 HISTORY — DX: Other specified postprocedural states: Z98.890

## 2024-03-06 HISTORY — PX: TURBINATE REDUCTION: SHX6157

## 2024-03-06 HISTORY — DX: Dizziness and giddiness: R42

## 2024-03-06 HISTORY — PX: ETHMOIDECTOMY: SHX5197

## 2024-03-06 SURGERY — SINUS SURGERY, WITH IMAGING GUIDANCE
Anesthesia: General | Laterality: Bilateral

## 2024-03-06 MED ORDER — CLINDAMYCIN PHOSPHATE 300 MG/2ML IJ SOLN
INTRAMUSCULAR | Status: AC
  Filled 2024-03-06: qty 2

## 2024-03-06 MED ORDER — LACTATED RINGERS IV SOLN: INTRAVENOUS | Status: DC

## 2024-03-06 MED ORDER — DEXAMETHASONE SODIUM PHOSPHATE 4 MG/ML IJ SOLN
INTRAMUSCULAR | Status: AC
  Filled 2024-03-06: qty 2

## 2024-03-06 MED ORDER — PROPOFOL 10 MG/ML IV BOLUS
INTRAVENOUS | Status: DC | PRN
  Administered 2024-03-06: 150 mg via INTRAVENOUS

## 2024-03-06 MED ORDER — SUGAMMADEX SODIUM 200 MG/2ML IV SOLN
INTRAVENOUS | Status: DC | PRN
  Administered 2024-03-06: 188.6 mg via INTRAVENOUS

## 2024-03-06 MED ORDER — SUGAMMADEX SODIUM 200 MG/2ML IV SOLN
INTRAVENOUS | Status: AC
  Filled 2024-03-06: qty 2

## 2024-03-06 MED ORDER — OXYCODONE HCL 5 MG/5ML PO SOLN
10.0000 mg | Freq: Once | ORAL | Status: AC
  Administered 2024-03-06: 10 mg via ORAL

## 2024-03-06 MED ORDER — SCOPOLAMINE 1 MG/3DAYS TD PT72
1.0000 | MEDICATED_PATCH | TRANSDERMAL | Status: DC
  Administered 2024-03-06: 1.5 mg via TRANSDERMAL

## 2024-03-06 MED ORDER — OXYMETAZOLINE HCL 0.05 % NA SOLN
NASAL | Status: DC | PRN
  Administered 2024-03-06: 1 via TOPICAL

## 2024-03-06 MED ORDER — CLINDAMYCIN PHOSPHATE 600 MG/50ML IV SOLN
INTRAVENOUS | Status: DC | PRN
  Administered 2024-03-06: 600 mg via INTRAVENOUS

## 2024-03-06 MED ORDER — DOXYCYCLINE HYCLATE 100 MG PO CAPS
100.0000 mg | ORAL_CAPSULE | Freq: Two times a day (BID) | ORAL | 0 refills | Status: AC
Start: 2024-03-06 — End: 2024-03-13

## 2024-03-06 MED ORDER — OXYCODONE HCL 5 MG PO TABS: 5.0000 mg | ORAL_TABLET | ORAL | 0 refills | Status: DC | PRN

## 2024-03-06 MED ORDER — ROCURONIUM BROMIDE 10 MG/ML (PF) SYRINGE
PREFILLED_SYRINGE | INTRAVENOUS | Status: AC
  Filled 2024-03-06: qty 10

## 2024-03-06 MED ORDER — ONDANSETRON HCL 4 MG/2ML IJ SOLN
INTRAMUSCULAR | Status: AC
Start: 2024-03-06 — End: ?
  Filled 2024-03-06: qty 2

## 2024-03-06 MED ORDER — ROCURONIUM BROMIDE 100 MG/10ML IV SOLN
INTRAVENOUS | Status: DC | PRN
  Administered 2024-03-06: 25 mg via INTRAVENOUS
  Administered 2024-03-06: 10 mg via INTRAVENOUS

## 2024-03-06 MED ORDER — MIDAZOLAM HCL 2 MG/2ML IJ SOLN
INTRAMUSCULAR | Status: AC
Start: 2024-03-06 — End: ?
  Filled 2024-03-06: qty 2

## 2024-03-06 MED ORDER — SODIUM CHLORIDE 0.9 % IV SOLN: INTRAVENOUS | Status: DC

## 2024-03-06 MED ORDER — FENTANYL CITRATE (PF) 100 MCG/2ML IJ SOLN
INTRAMUSCULAR | Status: AC
  Filled 2024-03-06: qty 2

## 2024-03-06 MED ORDER — OXYCODONE HCL 5 MG/5ML PO SOLN
ORAL | Status: AC
  Filled 2024-03-06: qty 10

## 2024-03-06 MED ORDER — LIDOCAINE-EPINEPHRINE 1 %-1:100000 IJ SOLN
INTRAMUSCULAR | Status: DC | PRN
  Administered 2024-03-06: 7 mL

## 2024-03-06 MED ORDER — MIDAZOLAM HCL 5 MG/5ML IJ SOLN
INTRAMUSCULAR | Status: DC | PRN
  Administered 2024-03-06: 2 mg via INTRAVENOUS

## 2024-03-06 MED ORDER — HEMOSTATIC AGENTS (NO CHARGE) OPTIME
TOPICAL | Status: DC | PRN
  Administered 2024-03-06: 2 via TOPICAL

## 2024-03-06 MED ORDER — SUCCINYLCHOLINE CHLORIDE 200 MG/10ML IV SOSY
PREFILLED_SYRINGE | INTRAVENOUS | Status: DC | PRN
  Administered 2024-03-06: 120 mg via INTRAVENOUS

## 2024-03-06 MED ORDER — SCOPOLAMINE 1 MG/3DAYS TD PT72
MEDICATED_PATCH | TRANSDERMAL | Status: AC
  Filled 2024-03-06: qty 1

## 2024-03-06 MED ORDER — EPHEDRINE SULFATE (PRESSORS) 50 MG/ML IJ SOLN
INTRAMUSCULAR | Status: DC | PRN
  Administered 2024-03-06: 10 mg via INTRAVENOUS

## 2024-03-06 MED ORDER — ONDANSETRON HCL 4 MG/2ML IJ SOLN
4.0000 mg | Freq: Once | INTRAMUSCULAR | Status: AC
  Administered 2024-03-06: 4 mg via INTRAVENOUS

## 2024-03-06 MED ORDER — LIDOCAINE HCL (CARDIAC) PF 100 MG/5ML IV SOSY
PREFILLED_SYRINGE | INTRAVENOUS | Status: DC | PRN
  Administered 2024-03-06: 100 mg via INTRAVENOUS

## 2024-03-06 MED ORDER — PROPOFOL 500 MG/50ML IV EMUL
INTRAVENOUS | Status: DC | PRN
  Administered 2024-03-06: 75 ug/kg/min via INTRAVENOUS

## 2024-03-06 MED ORDER — ONDANSETRON HCL 4 MG/2ML IJ SOLN
INTRAMUSCULAR | Status: DC | PRN
  Administered 2024-03-06: 4 mg via INTRAVENOUS

## 2024-03-06 MED ORDER — ONDANSETRON HCL 4 MG/2ML IJ SOLN
INTRAMUSCULAR | Status: AC
  Filled 2024-03-06: qty 2

## 2024-03-06 MED ORDER — EPHEDRINE 5 MG/ML INJ
INTRAVENOUS | Status: AC
  Filled 2024-03-06: qty 5

## 2024-03-06 MED ORDER — DEXAMETHASONE SODIUM PHOSPHATE 4 MG/ML IJ SOLN
INTRAMUSCULAR | Status: DC | PRN
  Administered 2024-03-06: 8 mg via INTRAVENOUS

## 2024-03-06 MED ORDER — PROPOFOL 10 MG/ML IV BOLUS
INTRAVENOUS | Status: AC
  Filled 2024-03-06: qty 20

## 2024-03-06 MED ORDER — FENTANYL CITRATE (PF) 100 MCG/2ML IJ SOLN
INTRAMUSCULAR | Status: DC | PRN
  Administered 2024-03-06: 100 ug via INTRAVENOUS

## 2024-03-06 SURGICAL SUPPLY — 35 items
BLADE ELECT COATED/INSUL 125 (ELECTRODE) IMPLANT
BLADE SHAVER TRUDI STR 4 (ENT DISPOSABLE) ×1 IMPLANT
CABLE TRUDI DISPOSABLE (ENT DISPOSABLE) ×2 IMPLANT
CANISTER SUCT 1200ML W/VALVE (MISCELLANEOUS) ×1 IMPLANT
CATH IV 18X1 1/4 SAFELET (CATHETERS) IMPLANT
COAGULATOR SUCT 8FR VV (MISCELLANEOUS) ×1 IMPLANT
COAGULATOR SUCTION 6 10FR HC (MISCELLANEOUS) ×1 IMPLANT
CORD BIP STRL DISP 12FT (MISCELLANEOUS) ×1 IMPLANT
CURETTE TRUDI 5 0D SHARP TIP (MISCELLANEOUS) IMPLANT
ELECTRODE REM PT RTRN 9FT ADLT (ELECTROSURGICAL) ×1 IMPLANT
GAUZE SPONGE 4X4 12PLY STRL (GAUZE/BANDAGES/DRESSINGS) ×1 IMPLANT
GLOVE PI ULTRA LF STRL 7.5 (GLOVE) ×2 IMPLANT
GOWN STRL REUS W/ TWL LRG LVL3 (GOWN DISPOSABLE) ×1 IMPLANT
IV NS 250ML BAXH (IV SOLUTION) IMPLANT
KIT TURNOVER KIT A (KITS) ×1 IMPLANT
KNIFE 45D UP 2.3 (MISCELLANEOUS) ×1 IMPLANT
NDL ANESTHESIA 27G X 3.5 (NEEDLE) ×1 IMPLANT
NEEDLE ANESTHESIA 27G X 3.5 (NEEDLE) ×1 IMPLANT
NS IRRIG 500ML POUR BTL (IV SOLUTION) ×1 IMPLANT
PACK ENT CUSTOM (PACKS) ×1 IMPLANT
PACKING NASAL HEMOPORE 8 (HEMOSTASIS) IMPLANT
PATTIES SURGICAL .5 X3 (DISPOSABLE) ×2 IMPLANT
SOLUTION ANTFG W/FOAM PAD STRL (MISCELLANEOUS) ×1 IMPLANT
SPLINT NASAL AIRWAY SILICONE (MISCELLANEOUS) ×1 IMPLANT
STAPLER SKIN PROX 35W (STAPLE) ×1 IMPLANT
STRAP BODY AND KNEE 60X3 (MISCELLANEOUS) ×1 IMPLANT
STRIP CLOSURE SKIN 1/2X4 (GAUZE/BANDAGES/DRESSINGS) ×1 IMPLANT
SUT PLAIN GUT 4-0 (SUTURE) IMPLANT
SYR 10ML LL (SYRINGE) ×2 IMPLANT
SYR 50ML LL SCALE MARK (SYRINGE) ×1 IMPLANT
TOWEL OR 17X26 4PK STRL BLUE (TOWEL DISPOSABLE) ×1 IMPLANT
TRACKER DISPOSABLE PAITIENT (MISCELLANEOUS) ×1 IMPLANT
TUBE SALEM SUMP 16F (TUBING) ×1 IMPLANT
TUBING IRRIGATION BIEN-AIR (TUBING) ×1 IMPLANT
WATER STERILE IRR 250ML POUR (IV SOLUTION) ×1 IMPLANT

## 2024-03-06 NOTE — Transfer of Care (Signed)
 Immediate Anesthesia Transfer of Care Note  Patient: Barbara [name]  Procedure(s) Performed: SINUS SURGERY, WITH IMAGING GUIDANCE (Bilateral) ETHMOIDECTOMY (Bilateral) MAXILLARY ANTROSTOMY (Bilateral) REDUCTION, NASAL TURBINATE (Bilateral) SEPTOPLASTY, NOSE (Bilateral)  Patient Location: PACU  Anesthesia Type: General ETT  Level of Consciousness: awake, alert  and patient cooperative  Airway and Oxygen Therapy: Patient Spontanous Breathing and Patient connected to supplemental oxygen  Post-op Assessment: Post-op Vital signs reviewed, Patient's Cardiovascular Status Stable, Respiratory Function Stable, Patent Airway and No signs of Nausea or vomiting  Post-op Vital Signs: Reviewed and stable  Complications: No notable events documented.

## 2024-03-06 NOTE — Op Note (Signed)
 OPERATIVE REPORT    Attending Physician: Sherral Do. 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  Bilateral chronic rhinosinusitis despite maximal medical therapy. Postoperative Diagnosis: Same.   Procedure(s) Performed:   1.  Bilateral endoscopic total ethmoidectomy with tissue removal, CPT 31255-50 2.  Bilateral endoscopic maxillary antrostomy with tissue removal, CPT 78295-62 3.  Endoscopic septoplasty, CPT 30520 4.  Bilateral coblation and outracture of inferior turbinates, CPT 30802-50 5.  Bilateral CT image guidance with Medtronic Stealth/Fusion System, [place] [address]   Teaching Surgeon: Sherral Do. Jolly Needle, MD, MBA, FARS Assistants: None Dictated   Anesthesia:  General with cuffed regular ETT tube Specimens:  Right and left sinus content Drains:  None Estimated Blood Loss: Less than 45 mL  Operative Findings:  Bilaterally enlarged inferior turbinates; significant nasal septal deviation / deformity.  Bilateral maxillary and ethmoid sinus inflammation and polypoid mucosa.    Procedure: After informed consent was obtained, the patient was brought from the preoperative holding area to the operating room and placed supine on the operating room table.  After smooth induction of general anesthesia with a cuffed ET tube, a timeout was called and all parties were in agreement.  At this point, attention was turned to the sinus surgery portion of the procedure.  Nasal pledgets with topical oxymetazoline  were placed bilaterally.  The patient was then registered with the Medtronic Stealth/Fusion CT image-guidance system accurately (both straight and curved pointers / instruments).  The patient was then draped in the usual sterile manner.  The nose was examined endoscopically on each side.  A pledget was replaced on the right and attention turned to the left.  The left middle  turbinate was medialized gently and the uncinate ressected with a micro [place] pediatric back biter.  A maxillary antrostomy was then created and polypoid tissue from the middle meatus ressected.  A significant amount of purulence and mucoid drainage was removed from the maxillary sinus using a curved suction.  Next a total ethmoidectomy was performed using a suction, J-curette and a straight blakesly forcep.  Remaining ethmoid cells were then removed superiorly from the anterior skull base in a posterior to anterior manner.  At this point a pledget was placed in the ethmoid cavity and attention turned to the right side.  The right side was then examined using a zero degree sinus scope. The right middle turbinate was medialized gently and the uncinate ressected with a micro [place] pediatric back biter.  A maxillary antrostomy was then created and polypoid tissue from the middle meatus ressected.  A significant amount of purulence and mucoid drainage was removed from the maxillary sinus using a curved suction.  Next a total ethmoidectomy was performed using a suction, J-curette and a straight blakesly forcep.  Remaining ethmoid cells were then removed superiorly from the anterior skull base in a posterior to anterior manner.  At this point a pledget was placed in the ethmoid cavity.  The sinuses were then irrigated throughout, bilaterally, using warm saline.  Regular dissolvable Nasapore was then placed gently in the middle meatus and ethmoid cavity bilaterally.  Adequate hemostasis was assured.  Attention was then turned to the septoplasty portion of the procedure.  Nasal pledgets were placed bilaterally with topical oxymetazoline  and approximately 8 mL total of 1% lidocaine  with 1:100,000 epinephrine  injected submucosally into the nasal septum and bilateral inferior turbinates.  The nasal hairs were trimmed bilaterally.  A Killian incision was made on the right using a crescent knife and a right  mucoperichondrial flap raised using a cottle elevator and suction freer.  The incision was then carried through the cartilage with the crescent knife and the left sided mucoperichondrial flap raised as well - deviated nasal septal cartilage and bone was then removed using blakesly tru-cut forceps and straight blakesly forceps.  A healthy margin of septal cartilage was preserved both dorsally and caudally for support of the nasal tip.  A 6 mm osteotome was then used to remove the deviated nasal spine from the floor of the nose.  The mucoperichondrial flaps were then reapproximated and the incision closed using a 4-0 plain gut whip stitch.    Soft silastic splints were then applied bilaterally on either side of the septum and secured with a single 3-0 Ethilon suture.   Attention was then turned to the inferior turbinates bilaterally.  Nasal pledgets soaked in oxymetazoline  were placed bilaterally.  The nose and nasal cavity was then examined with a nasal speculum and the bilateral inferior turbinates treated submucosally with coblation.  The pediatric coblation wand was inserted submucosally and 2 ten second lesions delivered.  The probe was then removed and adequate hemostasis assured.    The bilateral inferior turbinates were then gently outfractured with a Sayer elevator with immediate improvement in the nasal airway.  Adequate hemostasis was again assured.   The patient's eyes were gently irrigated with BSS solution.The stomach was then suctioned, a drip pad with 4x4 gauze applied.  The stomach was then suctioned, and the patient turned 90 degrees back towards anesthesia.   The patient's care was turned over to the Anesthesia team who successfully awakened the patient without event. The patient was transported to the PACU in stable condition. All instrument, sharp and lap counts were correct at the end of the case.   Teaching Surgeon Attestation:  I was present and performed the entire  procedure.  Sherral Do. Jolly Needle, MD, MBA, [facility]olaryngology-Head [facility] Neck [facility] [place] [facility] [phone]

## 2024-03-06 NOTE — Interval H&P Note (Signed)
 History and Physical Interval Note:  03/06/2024 9:54 AM  Barbara [name]  has presented today for surgery, with the diagnosis of CHRONIC SINUSITIS  DEVIATED NASAL SEPTUM.  The various methods of treatment have been discussed with the patient and family. After consideration of risks, benefits and other options for treatment, the patient has consented to  Procedure(s) with comments: SINUS SURGERY, WITH IMAGING GUIDANCE (Bilateral) ETHMOIDECTOMY (Bilateral) - TOTAL ETHMOIDECTOMY MAXILLARY ANTROSTOMY (Bilateral) - MAXILLARY ANTROSTOMY WITH TISSUE REMOVAL REDUCTION, NASAL TURBINATE (Bilateral) - COBLATION/ OUT-FRACTURE OF INFERIOR TURBINATES SEPTOPLASTY, NOSE (Bilateral) as a surgical intervention.  The patient's history has been reviewed, patient examined, [name] change in status, stable for surgery.  I have reviewed the patient's chart and labs.  Questions were answered to the patient's satisfaction.     Barbara [name] S  [name] changes to H&P.   Barbara [name]. Barbara Needle, MD, MBA, [facility]aryng[facility] [place] [facility] [phone]

## 2024-03-06 NOTE — Anesthesia Postprocedure Evaluation (Signed)
 Anesthesia Post Note  Patient: Barbara [name]  Procedure(s) Performed: SINUS SURGERY, WITH IMAGING GUIDANCE (Bilateral) ETHMOIDECTOMY (Bilateral) MAXILLARY ANTROSTOMY (Bilateral) REDUCTION, NASAL TURBINATE (Bilateral) SEPTOPLASTY, NOSE (Bilateral)  Patient location during evaluation: PACU Anesthesia Type: General Level of consciousness: awake and alert Pain management: pain level controlled Vital Signs Assessment: post-procedure vital signs reviewed and stable Respiratory status: spontaneous breathing, nonlabored ventilation, respiratory function stable and patient connected to nasal cannula oxygen Cardiovascular status: blood pressure returned to baseline and stable Postop Assessment: no apparent nausea or vomiting Anesthetic complications: no   No notable events documented.   Last Vitals:  Vitals:   03/06/24 1330 03/06/24 1345  BP: (!) 143/68 (!) 148/76  Pulse: 64 62  Resp: 13 12  Temp: (!) 36 C   SpO2: 95% 96%    Last Pain:  Vitals:   03/06/24 1330  TempSrc:   PainSc: 7                  Devora Tortorella C Topher [place]

## 2024-04-24 ENCOUNTER — Other Ambulatory Visit: Payer: Self-pay | Admitting: Obstetrics and Gynecology

## 2024-04-24 ENCOUNTER — Encounter: Payer: Self-pay | Admitting: Family Medicine

## 2024-04-24 DIAGNOSIS — Z1231 Encounter for screening mammogram for malignant neoplasm of breast: Secondary | ICD-10-CM

## 2024-05-21 ENCOUNTER — Other Ambulatory Visit: Payer: Self-pay | Admitting: Psychiatry

## 2024-05-21 DIAGNOSIS — G4701 Insomnia due to medical condition: Secondary | ICD-10-CM

## 2024-05-23 ENCOUNTER — Ambulatory Visit
Admission: RE | Admit: 2024-05-23 | Discharge: 2024-05-23 | Disposition: A | Discharge status: Home / Self Care | Source: Ambulatory Visit | Attending: Obstetrics and Gynecology

## 2024-05-23 DIAGNOSIS — Z1231 Encounter for screening mammogram for malignant neoplasm of breast: Secondary | ICD-10-CM | POA: Insufficient documentation

## 2024-05-29 ENCOUNTER — Ambulatory Visit: Payer: Self-pay | Admitting: Obstetrics and Gynecology

## 2024-06-19 ENCOUNTER — Encounter: Payer: Self-pay | Admitting: Obstetrics and Gynecology

## 2024-06-19 ENCOUNTER — Ambulatory Visit: Admitting: Obstetrics and Gynecology

## 2024-06-19 VITALS — BP 129/86 | HR 60 | Ht 69.0 in | Wt 207.3 lb

## 2024-06-19 DIAGNOSIS — Z803 Family history of malignant neoplasm of breast: Secondary | ICD-10-CM | POA: Diagnosis not present

## 2024-06-19 DIAGNOSIS — Z01419 Encounter for gynecological examination (general) (routine) without abnormal findings: Secondary | ICD-10-CM

## 2024-06-19 DIAGNOSIS — N3281 Overactive bladder: Secondary | ICD-10-CM | POA: Diagnosis not present

## 2024-06-19 DIAGNOSIS — Z1231 Encounter for screening mammogram for malignant neoplasm of breast: Secondary | ICD-10-CM

## 2024-06-19 NOTE — Progress Notes (Signed)
 PCP: Leavy Mole, PA-C   Chief Complaint  Patient presents with   Gynecologic Exam    HPI:      [name] is a 58 y.o. G1P1001 whose LMP was No LMP recorded. Patient is postmenopausal., presents today for her annual examination.  Her menses are absent due to menopause. No PMB.  She has tolerable vasomotor sx.   Sex activity: not sexually active. She does not have vaginal dryness/issues. 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   Last Pap: 02/15/22  Results were: no abnormalities /neg HPV DNA. No hx of abn paps.   Last mammogram: 05/23/24  Results were: normal--routine follow-up in 12 months There is a FH of breast cancer in her mat aunt, pat aunt and cousin, genetic testing not done/pt doesn't meet criteria. Her dad passed away from melanoma. There is no FH of ovarian cancer. The patient does not do self-breast exams.  Colonoscopy: ~2019 at [facility];  Repeat due after 10 years per pt.   Tobacco use: The patient denies current or previous tobacco use. Alcohol use: none No drug use Exercise: not active  She does get adequate calcium and Vitamin D  in her diet.  Labs with PCP/work.  Complains of urinary frequency with decreased flow/nocturia. Sometimes has to go hourly but usually Q2-3 hrs. Has nocturia but hasn't stopped drinking fluids a few hrs before bedtime. Drinks 1 caffeine soda in AM. Treated with myrbetriq  for OAB with Dr. Arloa without sx control. Was also expensive. Tried detrol  last yr; stopped Rx when ran out recently; no real change on detrol .    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 Arloa with [facility]   COLONOSCOPY     COLONOSCOPY     DILATION AND CURETTAGE OF UTERUS     ETHMOIDECTOMY Bilateral 03/06/2024   Procedure: ETHMOIDECTOMY;  Surgeon: Rumalda Massie GORMAN, MD;  Location: [facility];  Service: ENT;  Laterality: Bilateral;  TOTAL ETHMOIDECTOMY   IMAGE GUIDED SINUS SURGERY Bilateral 03/06/2024   Procedure: SINUS SURGERY, WITH IMAGING GUIDANCE;  Surgeon: Rumalda Massie GORMAN, MD;  Location: [facility];  Service: ENT;  Laterality: Bilateral;   MAXILLARY ANTROSTOMY Bilateral 03/06/2024   Procedure: MAXILLARY ANTROSTOMY;  Surgeon: Rumalda Massie GORMAN, MD;  Location: [facility];  Service: ENT;  Laterality: Bilateral;  MAXILLARY ANTROSTOMY WITH TISSUE REMOVAL   MELANOMA EXCISION     SEPTOPLASTY Bilateral 03/06/2024   Procedure: SEPTOPLASTY, NOSE;  Surgeon: Rumalda Massie GORMAN, MD;  Location: [facility];  Service: ENT;  Laterality: Bilateral;   TONSILLECTOMY     TURBINATE REDUCTION Bilateral 03/06/2024   Procedure: REDUCTION, NASAL TURBINATE;  Surgeon: Rumalda Massie  S, MD;  Location: [facility];  Service: ENT;  Laterality: Bilateral;  COBLATION/ OUT-FRACTURE OF INFERIOR TURBINATES    Family History  Problem Relation Age of Onset   Heart attack Father    Cancer Father        melanoma   Breast cancer Maternal Aunt        101s   Breast cancer Paternal Aunt        50s/60s   Breast cancer Cousin        32s   Prostate cancer Maternal Uncle        69   Prostate cancer  Paternal Uncle        10s    Social History   Socioeconomic History   Marital status: Divorced    Spouse name: Not on file   Number of children: 1   Years of education: Not on file   Highest education level: Associate degree: occupational, Scientist, product/process development, or vocational program  Occupational History    Comment: full time  Tobacco Use   Smoking status: Former    Current packs/day: 0.00    Average packs/day: 0.2 packs/day for 10.2 years (2.0 ttl pk-yrs)    Types: Cigarettes    Start date: 80    Quit date: 02/12/1990    Years since quitting: 34.3   Smokeless tobacco: Never  Vaping Use   Vaping status: Never Used  Substance and Sexual Activity   Alcohol use: No    Alcohol/week: 0.0 standard drinks of alcohol   Drug use: No   Sexual activity: Not Currently    Birth control/protection: Post-menopausal  Other Topics Concern   Not on file  Social History Narrative   Not on file   Social Drivers of Health   Financial Resource Strain: Low Risk  (07/28/2021)   Overall Financial Resource Strain (CARDIA)    Difficulty of Paying Living Expenses: Not hard at all  Food Insecurity: No Food Insecurity (07/28/2021)   Hunger Vital Sign    Worried About Running Out of Food in the Last Year: Never true    Ran Out of Food in the Last Year: Never true  Transportation Needs: No Transportation Needs (07/28/2021)   PRAPARE - Administrator, Civil Service (Medical): No    Lack of Transportation (Non-Medical): No  Physical Activity: Insufficiently Active (07/28/2021)   Exercise Vital Sign    Days of Exercise per Week: 1 day    Minutes of Exercise per Session: 20 min  Stress: Stress Concern Present (07/28/2021)   [employer] of Occupational Health - Occupational Stress Questionnaire    Feeling of Stress : To some extent  Social Connections: Moderately Isolated (07/28/2021)   Social Connection and Isolation Panel    Frequency of Communication with Friends and Family: More than three  times a week    Frequency of Social Gatherings with Friends and Family: More than three times a week    Attends Religious Services: 1 to 4 times per year    Active Member of [employer] or Organizations: No    Attends Banker Meetings: Never    Marital Status: Divorced  Catering manager Violence: Not At Risk (07/28/2021)   Humiliation, Afraid, Rape, and Kick questionnaire    Fear of Current or Ex-Partner: No    Emotionally Abused: No    Physically Abused: No    Sexually Abused: No     Current Outpatient Medications:    acetaminophen (TYLENOL) 650 MG CR tablet, Take by mouth.,  Disp: , Rfl:    acitretin (SORIATANE) 10 MG capsule, Take 10 mg by mouth daily., Disp: , Rfl:    albuterol  (VENTOLIN  HFA) 108 (90 Base) MCG/ACT inhaler, Inhale into the lungs every 6 (six) hours as needed for wheezing or shortness of breath., Disp: , Rfl:    Ascorbic Acid (VITAMIN C) 1000 MG tablet, Take 2,000 mg by mouth daily., Disp: , Rfl:    Cholecalciferol 25 MCG (1000 UT) tablet, Take by mouth., Disp: , Rfl:    divalproex  (DEPAKOTE  ER) 500 MG 24 hr tablet, Take 2 tablets (1,000 mg total) by mouth daily., Disp: 180 tablet, Rfl: 3   fluticasone (FLONASE) 50 MCG/ACT nasal spray, Place 2 sprays into both nostrils daily., Disp: , Rfl:    hydrOXYzine  (VISTARIL ) 25 MG capsule, Take 1 capsule (25 mg total) by mouth 2 (two) times daily as needed for anxiety., Disp: 60 capsule, Rfl: 1   levocetirizine (XYZAL) 5 MG tablet, Take 5 mg by mouth every evening., Disp: , Rfl:    Lurasidone  HCl 60 MG TABS, TAKE 1 TABLET BY MOUTH DAILY  WITH SUPPER, Disp: 90 tablet, Rfl: 3   minoxidil (LONITEN) 2.5 MG tablet, Take 1.25 mg by mouth daily., Disp: , Rfl:    neomycin-polymyxin b-dexamethasone  (MAXITROL) 3.5-10000-0.1 SUSP, , Disp: , Rfl: 0   oxyCODONE  (ROXICODONE ) 5 MG immediate release tablet, Take 1 tablet (5 mg total) by mouth every 4 (four) hours as needed for up to 20 doses for severe pain (pain score 7-10)., Disp: 20  tablet, Rfl: 0   pimecrolimus (ELIDEL) 1 % cream, Apply topically., Disp: , Rfl:    tolterodine  (DETROL  LA) 4 MG 24 hr capsule, Take 1 capsule (4 mg total) by mouth daily., Disp: 30 capsule, Rfl: 2   traZODone  (DESYREL ) 100 MG tablet, Take 1.5 tablets (150 mg total) by mouth at bedtime as needed for sleep., Disp: 135 tablet, Rfl: 3   triamcinolone cream (KENALOG) 0.1 %, APPLY TO AFFECTED AREA TWICE A DAY, Disp: , Rfl:      ROS:  Review of Systems  Constitutional:  Negative for fatigue, fever and unexpected weight change.  Respiratory:  Negative for cough, shortness of breath and wheezing.   Cardiovascular:  Negative for chest pain, palpitations and leg swelling.  Gastrointestinal:  Negative for blood in stool, constipation, diarrhea, nausea and vomiting.  Endocrine: Negative for cold intolerance, heat intolerance and polyuria.  Genitourinary:  Positive for frequency. Negative for dyspareunia, dysuria, flank pain, genital sores, hematuria, menstrual problem, pelvic pain, urgency, vaginal bleeding, vaginal discharge and vaginal pain.  Musculoskeletal:  Negative for back pain, joint swelling and myalgias.  Skin:  Negative for rash.  Neurological:  Negative for dizziness, syncope, light-headedness, numbness and headaches.  Hematological:  Negative for adenopathy.  Psychiatric/Behavioral:  Negative for agitation, confusion, sleep disturbance and suicidal ideas. The patient is not nervous/anxious.    BREAST: No symptoms    Objective: BP 129/86 (BP Location: Right Arm, Patient Position: Sitting, Cuff Size: Normal)   Pulse 60   Ht 5' 9 (1.753 m)   Wt 207 lb 4.8 oz (94 kg)   BMI 30.61 kg/m    Physical Exam Constitutional:      Appearance: She is well-developed.  Genitourinary:     Vulva normal.     Right Labia: rash.     Right Labia: No tenderness or lesions.    Left Labia: rash.     Left Labia: No tenderness or lesions.       No vaginal  discharge, erythema or tenderness.       Right Adnexa: not tender and no mass present.    Left Adnexa: not tender and no mass present.    No cervical friability or polyp.     Uterus is not enlarged or tender.  Breasts:    Right: No mass, nipple discharge, skin change or tenderness.     Left: No mass, nipple discharge, skin change or tenderness.  Neck:     Thyroid: No thyromegaly.  Cardiovascular:     Rate and Rhythm: Normal rate and regular rhythm.     Heart sounds: Normal heart sounds. No murmur heard. Pulmonary:     Effort: Pulmonary effort is normal.     Breath sounds: Normal breath sounds.  Abdominal:     Palpations: Abdomen is soft.     Tenderness: There is no abdominal tenderness. There is no guarding or rebound.  Musculoskeletal:        General: Normal range of motion.     Cervical back: Normal range of motion.  Lymphadenopathy:     Cervical: No cervical adenopathy.  Neurological:     General: No focal deficit present.     Mental Status: She is alert and oriented to person, place, and time.     Cranial Nerves: No cranial nerve deficit.  Skin:    General: Skin is warm and dry.  Psychiatric:        Mood and Affect: Mood normal.        Behavior: Behavior normal.        Thought Content: Thought content normal.        Judgment: Judgment normal.  Vitals reviewed.    Assessment/Plan:  Encounter for annual routine gynecological examination  Encounter for screening mammogram for malignant neoplasm of breast; pt current on mammo  OAB (overactive bladder)--limit caffeine/stop voiding before bedtime. See if sx improve. Can restart detrol  prn even though not really helpful.           GYN counsel breast self exam, mammography screening, menopause, adequate intake of calcium and vitamin D , diet and exercise    F/U  Return in about 1 year (around 06/19/2025).  Daissy Yerian B. Nahlia Hellmann, PA-C 06/19/2024 4:40 PM

## 2024-06-19 NOTE — Patient Instructions (Signed)
 I value your feedback and you entrusting [place] with your care. If you get a [place] patient survey, I would appreciate you taking the time to let us know about your experience today. Thank you! ? ? ?

## 2024-07-08 ENCOUNTER — Ambulatory Visit (INDEPENDENT_AMBULATORY_CARE_PROVIDER_SITE_OTHER): Admitting: Psychiatry

## 2024-07-08 ENCOUNTER — Encounter: Payer: Self-pay | Admitting: Psychiatry

## 2024-07-08 VITALS — BP 122/82 | HR 72 | Temp 98.3°F | Ht 69.0 in | Wt 204.4 lb

## 2024-07-08 DIAGNOSIS — Z634 Disappearance and death of family member: Secondary | ICD-10-CM

## 2024-07-08 DIAGNOSIS — F3176 Bipolar disorder, in full remission, most recent episode depressed: Secondary | ICD-10-CM

## 2024-07-08 DIAGNOSIS — G4701 Insomnia due to medical condition: Secondary | ICD-10-CM

## 2024-07-08 NOTE — Progress Notes (Signed)
 [facility] MD OP Progress Note  07/08/2024 4:57 PM Barbara [name]  MRN:  [id number]  Chief Complaint:  Chief Complaint  Patient presents with   Follow-up   Depression   Insomnia   Medication Refill   Discussed the use of AI scribe software for clinical note transcription with the patient, who gave verbal consent to proceed.  History of Present Illness Barbara [name] is a 58 year old Caucasian female lives in [place], employed, divorced, has a history of bipolar disorder, insomnia,darius disease, was evaluated in office today for a follow-up appointment.  She describes coping with grief related to the death of a close relative and says this process is getting easier over time. She states she has not sought therapy or counseling, but discusses her feelings with her mother and other family members.  She denies experiencing mood symptoms, including mania, hypomania, or depression, and indicates that work is going well.  Her current medication regimen includes Latuda  60 mg with dinner, Depakote  1000 mg at bedtime, trazodone  150 mg at bedtime, and hydroxyzine  as needed.  She denies any side effects.  She is currently employed and talks with her mother and other people for support.  She has a history of sinus surgery and allergies to multiple environmental allergens.  She is currently using as needed antihistamine that helps.  Denies any other concerns today.   Visit Diagnosis:    ICD-10-CM   1. Bipolar disorder, in full remission, most recent episode depressed (HCC)  F31.76     2. Insomnia due to medical condition  G47.01    Anxiety    3. Bereavement  Z63.4       Past Psychiatric History: I have reviewed past psychiatric history from progress note on 02/12/2018.  Past Medical History:  Past Medical History:  Diagnosis Date   Acid reflux    Adnexal mass    Bipolar disorder (HCC)    Depression    Depression    Herpes    Irregular menstrual bleeding    Melanoma (HCC)    PONV  (postoperative nausea and vomiting)    Vertigo    couple times per year    Past Surgical History:  Procedure Laterality Date   CERVICAL BIOPSY     Dr. Arloa with [facility]   COLONOSCOPY     COLONOSCOPY     DILATION AND CURETTAGE OF UTERUS     ETHMOIDECTOMY Bilateral 03/06/2024   Procedure: ETHMOIDECTOMY;  Surgeon: Rumalda Massie GORMAN, MD;  Location: [facility];  Service: ENT;  Laterality: Bilateral;  TOTAL ETHMOIDECTOMY   IMAGE GUIDED SINUS SURGERY Bilateral 03/06/2024   Procedure: SINUS SURGERY, WITH IMAGING GUIDANCE;  Surgeon: Rumalda Massie GORMAN, MD;  Location: [facility];  Service: ENT;  Laterality: Bilateral;   MAXILLARY ANTROSTOMY Bilateral 03/06/2024   Procedure: MAXILLARY ANTROSTOMY;  Surgeon: Rumalda Massie GORMAN, MD;  Location: [facility];  Service: ENT;  Laterality: Bilateral;  MAXILLARY ANTROSTOMY WITH TISSUE REMOVAL   MELANOMA EXCISION     SEPTOPLASTY Bilateral 03/06/2024   Procedure: SEPTOPLASTY, NOSE;  Surgeon: Rumalda Massie GORMAN, MD;  Location: [facility];  Service: ENT;  Laterality: Bilateral;   TONSILLECTOMY     TURBINATE REDUCTION Bilateral 03/06/2024   Procedure: REDUCTION, NASAL TURBINATE;  Surgeon: Rumalda Massie GORMAN, MD;  Location: [facility];  Service: ENT;  Laterality: Bilateral;  COBLATION/ OUT-FRACTURE OF INFERIOR TURBINATES    Family Psychiatric History: I have reviewed family psychiatric history from progress note on 02/12/2018.  Family History:  Family History  Problem Relation Age of Onset   Heart attack Father    Cancer Father        melanoma   Breast cancer Maternal Aunt        12s   Breast cancer Paternal Aunt        50s/60s   Breast cancer Cousin        53s   Prostate cancer Maternal Uncle        81   Prostate cancer Paternal Uncle        54s    Social History: I have reviewed social history from progress note on 02/12/2018. Social History   Socioeconomic History   Marital status: Divorced    Spouse name: Not on  file   Number of children: 1   Years of education: Not on file   Highest education level: Associate degree: occupational, Scientist, product/process development, or vocational program  Occupational History    Comment: full time  Tobacco Use   Smoking status: Former    Current packs/day: 0.00    Average packs/day: 0.2 packs/day for 10.2 years (2.0 ttl pk-yrs)    Types: Cigarettes    Start date: 31    Quit date: 02/12/1990    Years since quitting: 34.4   Smokeless tobacco: Never  Vaping Use   Vaping status: Never Used  Substance and Sexual Activity   Alcohol use: No    Alcohol/week: 0.0 standard drinks of alcohol   Drug use: No   Sexual activity: Not Currently    Birth control/protection: Post-menopausal  Other Topics Concern   Not on file  Social History Narrative   Not on file   Social Drivers of Health   Financial Resource Strain: Low Risk  (07/28/2021)   Overall Financial Resource Strain (CARDIA)    Difficulty of Paying Living Expenses: Not hard at all  Food Insecurity: No Food Insecurity (07/28/2021)   Hunger Vital Sign    Worried About Running Out of Food in the Last Year: Never true    Ran Out of Food in the Last Year: Never true  Transportation Needs: No Transportation Needs (07/28/2021)   PRAPARE - Administrator, Civil Service (Medical): No    Lack of Transportation (Non-Medical): No  Physical Activity: Insufficiently Active (07/28/2021)   Exercise Vital Sign    Days of Exercise per Week: 1 day    Minutes of Exercise per Session: 20 min  Stress: Stress Concern Present (07/28/2021)   [employer] of Occupational Health - Occupational Stress Questionnaire    Feeling of Stress : To some extent  Social Connections: Moderately Isolated (07/28/2021)   Social Connection and Isolation Panel    Frequency of Communication with Friends and Family: More than three times a week    Frequency of Social Gatherings with Friends and Family: More than three times a week    Attends Religious  Services: 1 to 4 times per year    Active Member of [employer] or Organizations: No    Attends Banker Meetings: Never    Marital Status: Divorced    Allergies:  Allergies  Allergen Reactions   Penicillins Hives and Other (See Comments)    delusional   Sulfa Antibiotics Hives and Other (See Comments)    dellusional   Prednisone Other (See Comments)    Heart racing    Metabolic Disorder Labs: Lab Results  Component Value Date   HGBA1C 5.3 12/17/2020   Lab Results  Component Value Date  PROLACTIN 11.3 08/03/2023   PROLACTIN 10.6 12/17/2020   Lab Results  Component Value Date   CHOL 226 (H) 07/29/2021   TRIG 181 (H) 07/29/2021   HDL 50 07/29/2021   CHOLHDL 4.5 (H) 07/29/2021   LDLCALC 143 (H) 07/29/2021   LDLCALC 141 (H) 08/25/2019   Lab Results  Component Value Date   TSH 2.830 08/03/2023   TSH 1.700 07/29/2021    Therapeutic Level Labs: No results found for: LITHIUM Lab Results  Component Value Date   VALPROATE 40 (L) 04/06/2023   VALPROATE 40 (L) 03/22/2022   No results found for: CBMZ  Current Medications: Current Outpatient Medications  Medication Sig Dispense Refill   acetaminophen (TYLENOL) 650 MG CR tablet Take by mouth.     acitretin (SORIATANE) 10 MG capsule Take 10 mg by mouth daily.     albuterol  (VENTOLIN  HFA) 108 (90 Base) MCG/ACT inhaler Inhale into the lungs every 6 (six) hours as needed for wheezing or shortness of breath.     Cholecalciferol 25 MCG (1000 UT) tablet Take by mouth.     divalproex  (DEPAKOTE  ER) 500 MG 24 hr tablet Take 2 tablets (1,000 mg total) by mouth daily. 180 tablet 3   fluticasone (FLONASE) 50 MCG/ACT nasal spray Place 2 sprays into both nostrils daily.     hydrOXYzine  (VISTARIL ) 25 MG capsule Take 1 capsule (25 mg total) by mouth 2 (two) times daily as needed for anxiety. 60 capsule 1   levocetirizine (XYZAL) 5 MG tablet Take 5 mg by mouth every evening.     Lurasidone  HCl 60 MG TABS TAKE 1 TABLET BY MOUTH  DAILY  WITH SUPPER 90 tablet 3   minoxidil (LONITEN) 2.5 MG tablet Take 1.25 mg by mouth daily.     neomycin-polymyxin b-dexamethasone  (MAXITROL) 3.5-10000-0.1 SUSP   0   pimecrolimus (ELIDEL) 1 % cream Apply topically.     traZODone  (DESYREL ) 100 MG tablet Take 1.5 tablets (150 mg total) by mouth at bedtime as needed for sleep. 135 tablet 3   triamcinolone cream (KENALOG) 0.1 % APPLY TO AFFECTED AREA TWICE A DAY     No current facility-administered medications for this visit.     Musculoskeletal: Strength & Muscle Tone: within normal limits Gait & Station: normal Patient leans: N/A  Psychiatric Specialty Exam: Review of Systems  Psychiatric/Behavioral:         Grief-improving    Blood pressure 122/82, pulse 72, temperature 98.3 F (36.8 C), temperature source Temporal, height 5' 9 (1.753 m), weight 204 lb 6.4 oz (92.7 kg), SpO2 94%.Body mass index is 30.18 kg/m.  General Appearance: Casual  Eye Contact:  Fair  Speech:  Clear and Coherent  Volume:  Normal  Mood:  Grief-improving  Affect:  Appropriate  Thought Process:  Goal Directed and Descriptions of Associations: Intact  Orientation:  Full (Time, Place, and Person)  Thought Content: Logical   Suicidal Thoughts:  No  Homicidal Thoughts:  No  Memory:  Immediate;   Fair Recent;   Fair Remote;   Fair  Judgement:  Fair  Insight:  Fair  Psychomotor Activity:  Normal  Concentration:  Concentration: Fair and Attention Span: Fair  Recall:  [employer] of Knowledge: Fair  Language: Fair  Akathisia:  No  Handed:  Right  AIMS (if indicated): done  Assets:  Communication Skills Desire for Improvement Housing Social Support Transportation  ADL's:  Intact  Cognition: WNL  Sleep:  Fair   Screenings: Midwife Visit  from 07/08/2024 in [facility] Office Visit from 08/01/2023 in [facility] Office Visit from 12/15/2022 in [facility] Office Visit from 06/16/2022 in [facility] Office Visit from 03/03/2022 in [facility]  AIMS Total Score 0 0 0 0 0   GAD-7    Flowsheet Row Office Visit from 07/08/2024 in [facility] Office Visit from 08/01/2023 in [facility] Office Visit from 12/15/2022 in [facility] Office Visit from 06/16/2022 in [facility] Office Visit from 08/19/2021 in [facility]  Total GAD-7 Score 0 0 0 0 3   PHQ2-9    Flowsheet Row Office Visit from 07/08/2024 in [facility] Office Visit from 08/01/2023 in [facility] Office Visit from 02/12/2023 in [facility] Office Visit from 12/15/2022 in [facility] Office Visit from 07/27/2022 in [facility]  PHQ-2 Total Score 0 0 0 0 0  PHQ-9 Total Score -- -- 0 -- 0   Flowsheet Row Office Visit from 07/08/2024 in [facility] Admission (Discharged) from 03/06/2024 in [facility] Office Visit from 01/23/2024 in [facility]  C-SSRS RISK CATEGORY No Risk No Risk No Risk     Assessment and Plan: Barbara [name] is a 58 year old Caucasian female, employed, divorced, lives in [place], has a history of bipolar disorder was evaluated in office today for a follow-up appointment.  Discussed assessment and plan as noted below.  Bipolar disorder in remission Currently managing well on the current medication regimen. Continue Latuda  60 mg daily with meals Continue Depakote  1000 mg at  bedtime (Depakote  level-48-5 /24/ 2024)   Insomnia-stable Currently denies any sleep problems. Continue Trazodone  150 mg at bedtime as needed Continue Hydroxyzine  25 mg twice a day as needed for anxiety and sleep  Bereavement-improving Currently reports coping better with her grief. Will reevaluate in future sessions. Patient to make use of support system.   Patient was provided lab slip to get Depakote  level, LFT, sodium and platelets last visit.  Patient encouraged to get it done.  Another lab slip was printed out and given to patient.  Follow-up Follow-up in clinic in 6 months or sooner if needed.   Consent: Patient/Guardian gives verbal consent for treatment and assignment of benefits for services provided during this visit. Patient/Guardian expressed understanding and agreed to proceed.   This note was generated in part or whole with voice recognition software. Voice recognition is usually quite accurate but there are transcription errors that can and very often do occur. I apologize for any typographical errors that were not detected and corrected.    Barbara Sitzer, MD 07/08/2024, 4:57 PM

## 2024-07-25 ENCOUNTER — Other Ambulatory Visit: Payer: Self-pay | Admitting: Psychiatry

## 2024-07-26 LAB — PLATELET COUNT: Platelets: 176 x10E3/uL (ref 150–450)

## 2024-07-26 LAB — VALPROIC ACID LEVEL: Valproic Acid Lvl: 76 ug/mL (ref 50–100)

## 2024-07-26 LAB — HEPATIC FUNCTION PANEL
ALT: 10 IU/L (ref 0–32)
AST: 16 IU/L (ref 0–40)
Albumin: 4.1 g/dL (ref 3.8–4.9)
Alkaline Phosphatase: 43 IU/L — ABNORMAL LOW (ref 44–121)
Bilirubin Total: 0.2 mg/dL (ref 0.0–1.2)
Bilirubin, Direct: 0.08 mg/dL (ref 0.00–0.40)
Total Protein: 6.5 g/dL (ref 6.0–8.5)

## 2024-07-26 LAB — SODIUM: Sodium: 141 mmol/L (ref 134–144)

## 2024-07-30 ENCOUNTER — Ambulatory Visit: Payer: Self-pay | Admitting: Psychiatry

## 2024-08-11 ENCOUNTER — Telehealth: Payer: Self-pay | Admitting: Psychiatry

## 2024-08-11 DIAGNOSIS — G4701 Insomnia due to medical condition: Secondary | ICD-10-CM

## 2024-08-11 DIAGNOSIS — F3176 Bipolar disorder, in full remission, most recent episode depressed: Secondary | ICD-10-CM

## 2024-08-11 MED ORDER — TRAZODONE HCL 100 MG PO TABS: 150.0000 mg | ORAL_TABLET | Freq: Every evening | ORAL | 3 refills | Status: AC | PRN

## 2024-08-11 MED ORDER — LURASIDONE HCL 60 MG PO TABS: 1.0000 | ORAL_TABLET | Freq: Every day | ORAL | 3 refills | Status: AC

## 2024-08-11 NOTE — Telephone Encounter (Signed)
 Please assist

## 2024-08-11 NOTE — Telephone Encounter (Signed)
 I have sent Latuda  and trazodone  to pharmacy as requested.

## 2024-08-11 NOTE — Telephone Encounter (Signed)
 Patient does not need Prior Authorization for the Lurasidone  and Trazodone  she needs authorization from the provider to get refills no refills on file

## 2024-12-16 ENCOUNTER — Ambulatory Visit: Admitting: Psychiatry

## 2025-01-27 ENCOUNTER — Ambulatory Visit: Admitting: Psychiatry
